# Patient Record
Sex: Female | Born: 1960 | Race: Black or African American | Hispanic: No | Smoking: Never smoker
Health system: Southern US, Community
[De-identification: ages and names within clinical notes are randomized; demographics above are authoritative.]

## PROBLEM LIST (undated history)

## (undated) DIAGNOSIS — I10 Essential (primary) hypertension: Secondary | ICD-10-CM

## (undated) DIAGNOSIS — E7800 Pure hypercholesterolemia, unspecified: Secondary | ICD-10-CM

## (undated) DIAGNOSIS — K219 Gastro-esophageal reflux disease without esophagitis: Secondary | ICD-10-CM

## (undated) HISTORY — DX: Pure hypercholesterolemia, unspecified: E78.00

## (undated) HISTORY — DX: Gastro-esophageal reflux disease without esophagitis: K21.9

## (undated) HISTORY — PX: OVARIAN CYST REMOVAL: SHX89

## (undated) HISTORY — PX: OTHER SURGICAL HISTORY: SHX169

## (undated) HISTORY — PX: OVARIAN CYST DRAINAGE: SHX325

---

## 2008-04-28 ENCOUNTER — Ambulatory Visit: Payer: Self-pay | Admitting: Family Medicine

## 2008-04-28 DIAGNOSIS — G4700 Insomnia, unspecified: Secondary | ICD-10-CM

## 2008-04-28 DIAGNOSIS — H9319 Tinnitus, unspecified ear: Secondary | ICD-10-CM | POA: Insufficient documentation

## 2008-04-28 DIAGNOSIS — N949 Unspecified condition associated with female genital organs and menstrual cycle: Secondary | ICD-10-CM

## 2008-04-28 DIAGNOSIS — D508 Other iron deficiency anemias: Secondary | ICD-10-CM

## 2008-04-28 DIAGNOSIS — J309 Allergic rhinitis, unspecified: Secondary | ICD-10-CM

## 2008-05-02 ENCOUNTER — Encounter: Payer: Self-pay | Admitting: Family Medicine

## 2008-05-14 ENCOUNTER — Encounter: Payer: Self-pay | Admitting: Family Medicine

## 2008-05-19 ENCOUNTER — Encounter: Payer: Self-pay | Admitting: Family Medicine

## 2008-06-13 HISTORY — PX: COLONOSCOPY: SHX174

## 2008-07-02 ENCOUNTER — Ambulatory Visit: Payer: Self-pay | Admitting: Family Medicine

## 2008-07-02 ENCOUNTER — Encounter: Payer: Self-pay | Admitting: Family Medicine

## 2008-07-02 ENCOUNTER — Other Ambulatory Visit: Admission: RE | Admit: 2008-07-02 | Discharge: 2008-07-02 | Payer: Self-pay | Admitting: Family Medicine

## 2008-07-02 DIAGNOSIS — N809 Endometriosis, unspecified: Secondary | ICD-10-CM | POA: Insufficient documentation

## 2008-07-02 DIAGNOSIS — E049 Nontoxic goiter, unspecified: Secondary | ICD-10-CM | POA: Insufficient documentation

## 2008-07-02 DIAGNOSIS — H547 Unspecified visual loss: Secondary | ICD-10-CM | POA: Insufficient documentation

## 2008-07-03 ENCOUNTER — Encounter: Payer: Self-pay | Admitting: Family Medicine

## 2008-07-04 LAB — CONVERTED CEMR LAB
CO2: 24 meq/L (ref 19–32)
Chloride: 106 meq/L (ref 96–112)
Glucose, Bld: 76 mg/dL (ref 70–99)
HDL: 62 mg/dL (ref >39)
Hemoglobin: 10.7 g/dL — ABNORMAL LOW (ref 12.0–15.0)
LDL Cholesterol: 137 mg/dL — ABNORMAL HIGH (ref 0–99)
Lymphs Abs: 2.9 10*3/uL (ref 0.7–4.0)
MCHC: 32.7 g/dL (ref 30.0–36.0)
MCV: 92.1 fL (ref 78.0–100.0)
Neutro Abs: 3.5 10*3/uL (ref 1.7–7.7)
RBC: 3.55 M/uL — ABNORMAL LOW (ref 3.87–5.11)
RDW: 13 % (ref 11.5–15.5)
Sodium: 142 meq/L (ref 135–145)
Total CHOL/HDL Ratio: 3.5
Triglycerides: 90 mg/dL (ref <150)
VLDL: 18 mg/dL (ref 0–40)
WBC: 6.8 10*3/uL (ref 4.0–10.5)

## 2008-07-06 DIAGNOSIS — I10 Essential (primary) hypertension: Secondary | ICD-10-CM

## 2008-07-10 ENCOUNTER — Ambulatory Visit: Payer: Self-pay | Admitting: Family Medicine

## 2008-07-10 ENCOUNTER — Telehealth: Payer: Self-pay | Admitting: Family Medicine

## 2008-07-10 DIAGNOSIS — R5381 Other malaise: Secondary | ICD-10-CM

## 2008-07-10 DIAGNOSIS — E538 Deficiency of other specified B group vitamins: Secondary | ICD-10-CM | POA: Insufficient documentation

## 2008-07-10 DIAGNOSIS — R5383 Other fatigue: Secondary | ICD-10-CM

## 2008-07-11 ENCOUNTER — Ambulatory Visit (HOSPITAL_COMMUNITY): Admission: RE | Admit: 2008-07-11 | Discharge: 2008-07-11 | Payer: Self-pay | Admitting: Pediatrics

## 2008-09-09 ENCOUNTER — Ambulatory Visit: Payer: Self-pay | Admitting: Family Medicine

## 2008-09-09 DIAGNOSIS — K3189 Other diseases of stomach and duodenum: Secondary | ICD-10-CM

## 2008-09-09 DIAGNOSIS — R634 Abnormal weight loss: Secondary | ICD-10-CM | POA: Insufficient documentation

## 2008-09-09 DIAGNOSIS — R1013 Epigastric pain: Secondary | ICD-10-CM

## 2008-09-11 LAB — CONVERTED CEMR LAB: Vitamin B-12: 730 pg/mL (ref 211–911)

## 2008-10-09 ENCOUNTER — Telehealth: Payer: Self-pay | Admitting: Family Medicine

## 2008-10-14 ENCOUNTER — Telehealth: Payer: Self-pay | Admitting: Family Medicine

## 2008-12-01 ENCOUNTER — Ambulatory Visit: Payer: Self-pay | Admitting: Family Medicine

## 2008-12-04 LAB — CONVERTED CEMR LAB: Retic Ct Pct: 1.3 % (ref 0.4–3.1)

## 2008-12-30 ENCOUNTER — Encounter: Payer: Self-pay | Admitting: Family Medicine

## 2008-12-31 ENCOUNTER — Encounter: Payer: Self-pay | Admitting: Family Medicine

## 2009-01-01 ENCOUNTER — Encounter: Payer: Self-pay | Admitting: Family Medicine

## 2009-01-07 ENCOUNTER — Telehealth: Payer: Self-pay | Admitting: Family Medicine

## 2009-04-02 ENCOUNTER — Ambulatory Visit: Payer: Self-pay | Admitting: Family Medicine

## 2009-04-02 DIAGNOSIS — F329 Major depressive disorder, single episode, unspecified: Secondary | ICD-10-CM

## 2009-08-17 LAB — CONVERTED CEMR LAB
Basophils Relative: 1 % (ref 0–1)
CO2: 26 meq/L (ref 19–32)
Cholesterol: 209 mg/dL — ABNORMAL HIGH (ref 0–200)
Creatinine, Ser: 0.94 mg/dL (ref 0.40–1.20)
Eosinophils Absolute: 0.2 10*3/uL (ref 0.0–0.7)
HDL: 52 mg/dL (ref >39)
LDL Cholesterol: 142 mg/dL — ABNORMAL HIGH (ref 0–99)
Lymphocytes Relative: 36 % (ref 12–46)
MCV: 92.9 fL (ref 78.0–100.0)
Monocytes Absolute: 0.3 10*3/uL (ref 0.1–1.0)
Neutrophils Relative %: 55 % (ref 43–77)
Platelets: 313 10*3/uL (ref 150–400)
Potassium: 3.6 meq/L (ref 3.5–5.3)
RBC: 3.66 M/uL — ABNORMAL LOW (ref 3.87–5.11)
Triglycerides: 75 mg/dL (ref <150)
WBC: 6.9 10*3/uL (ref 4.0–10.5)

## 2009-08-18 ENCOUNTER — Ambulatory Visit: Payer: Self-pay | Admitting: Family Medicine

## 2009-08-24 ENCOUNTER — Encounter: Payer: Self-pay | Admitting: Family Medicine

## 2009-08-31 ENCOUNTER — Telehealth: Payer: Self-pay | Admitting: Family Medicine

## 2009-09-07 ENCOUNTER — Telehealth: Payer: Self-pay | Admitting: Family Medicine

## 2009-09-24 ENCOUNTER — Ambulatory Visit: Payer: Self-pay | Admitting: Orthopedic Surgery

## 2009-09-24 DIAGNOSIS — M23329 Other meniscus derangements, posterior horn of medial meniscus, unspecified knee: Secondary | ICD-10-CM | POA: Insufficient documentation

## 2009-09-28 ENCOUNTER — Telehealth: Payer: Self-pay | Admitting: Orthopedic Surgery

## 2009-10-07 ENCOUNTER — Ambulatory Visit (HOSPITAL_COMMUNITY): Admission: RE | Admit: 2009-10-07 | Discharge: 2009-10-07 | Payer: Self-pay | Admitting: Orthopedic Surgery

## 2009-10-14 ENCOUNTER — Ambulatory Visit: Payer: Self-pay | Admitting: Orthopedic Surgery

## 2009-10-14 DIAGNOSIS — M1710 Unilateral primary osteoarthritis, unspecified knee: Secondary | ICD-10-CM | POA: Insufficient documentation

## 2009-10-14 DIAGNOSIS — IMO0002 Reserved for concepts with insufficient information to code with codable children: Secondary | ICD-10-CM

## 2010-01-27 ENCOUNTER — Ambulatory Visit: Payer: Self-pay | Admitting: Family Medicine

## 2010-01-27 DIAGNOSIS — M25569 Pain in unspecified knee: Secondary | ICD-10-CM

## 2010-01-27 DIAGNOSIS — M899 Disorder of bone, unspecified: Secondary | ICD-10-CM | POA: Insufficient documentation

## 2010-01-27 DIAGNOSIS — M949 Disorder of cartilage, unspecified: Secondary | ICD-10-CM

## 2010-02-03 ENCOUNTER — Telehealth (INDEPENDENT_AMBULATORY_CARE_PROVIDER_SITE_OTHER): Payer: Self-pay | Admitting: *Deleted

## 2010-02-03 ENCOUNTER — Ambulatory Visit: Payer: Self-pay | Admitting: Family Medicine

## 2010-02-03 DIAGNOSIS — R3915 Urgency of urination: Secondary | ICD-10-CM | POA: Insufficient documentation

## 2010-02-03 DIAGNOSIS — R079 Chest pain, unspecified: Secondary | ICD-10-CM

## 2010-02-03 LAB — CONVERTED CEMR LAB
Bilirubin Urine: NEGATIVE
Glucose, Urine, Semiquant: NEGATIVE
Ketones, urine, test strip: NEGATIVE
Specific Gravity, Urine: 1.02

## 2010-02-04 ENCOUNTER — Encounter: Payer: Self-pay | Admitting: Physician Assistant

## 2010-02-05 DIAGNOSIS — E785 Hyperlipidemia, unspecified: Secondary | ICD-10-CM | POA: Insufficient documentation

## 2010-02-10 ENCOUNTER — Telehealth: Payer: Self-pay | Admitting: Physician Assistant

## 2010-06-09 ENCOUNTER — Encounter: Payer: Self-pay | Admitting: Family Medicine

## 2010-07-04 ENCOUNTER — Encounter: Payer: Self-pay | Admitting: Family Medicine

## 2010-07-04 ENCOUNTER — Encounter: Payer: Self-pay | Admitting: Orthopedic Surgery

## 2010-07-05 ENCOUNTER — Ambulatory Visit: Admit: 2010-07-05 | Payer: Self-pay | Admitting: Family Medicine

## 2010-07-13 NOTE — Assessment & Plan Note (Signed)
Summary: MRI RESULTS BRINGING DISC/AETNA/BSF   Visit Type:  Follow-up Referring Provider:  Dr. Lodema Hong Primary Provider:  Dr. Lodema Hong  CC:  right knee pain.  History of Present Illness: 50 years old, and RIGHT knee pain after trauma, returns for MRI review Meds: Maxzide, Restoril, Fluoxetine, Vitamin B12, Ibuprofen.  MRI results.  IMPRESSION:   1.  No evidence of meniscal tear. 2.  Mild lateral tibial chondromalacia. 3.  No acute osteochondral or ligamentous findings identified.   Read By:  Gerrianne Scale,  M.D.      The patient did well while she was on her cruise vacation , [place]. The temperature was a little bit cool. Pain started again. mainly we are dealing with arthritis.  Recommend one of the following with the Tylenol arthritis, and arthritis medication, injection. Course exercise is encouraged. Walking.    Allergies: 1)  ! Pcn   Impression & Recommendations:  Problem # 1:  DERANGEMENT OF POSTERIOR HORN OF MEDIAL MENISCUS (ICD-717.2) Assessment Comment Only cartilage was normal in terms of the meniscus  Other Orders: Est. Patient Level II ([address])  Patient Instructions: 1)  Tylenol 500 - 1000 mg 2 -3 times per day  2)  Please schedule a follow-up appointment as needed.

## 2010-07-13 NOTE — Progress Notes (Signed)
Summary: refill  Phone Note Call from Patient   Summary of Call: needs a refill on [facility]onase. [facility] [place] [phone] Initial call taken by: Rudene Anda,  August 31, 2009 8:30 AM  Follow-up for Phone Call        Rx Called In Follow-up by: Adella Hare LPN,  August 31, 2009 8:48 AM    New/Updated Medications: FLONASE 50 MCG/ACT SUSP (FLUTICASONE PROPIONATE) two puffs per nostril daily as needed Prescriptions: FLONASE 50 MCG/ACT SUSP (FLUTICASONE PROPIONATE) two puffs per nostril daily as needed  #1 x 5   Entered by:   Adella Hare LPN   Authorized by:   Syliva Overman MD   Signed by:   Adella Hare LPN on 84/69/6295   Method used:   [facility]  [address] [address] [address] [id number]       [address]       [place], [place]  [address]       Ph: [phone] or [phone]       Fax: [phone]   RxID:   [id number]

## 2010-07-13 NOTE — Letter (Signed)
Summary: History form  History form   Imported By: Jacklynn Ganong 09/28/2009 13:56:17  _____________________________________________________________________  External Attachment:    Type:   Image     Comment:   External Document

## 2010-07-13 NOTE — Progress Notes (Signed)
Summary: MRI appointment.  Phone Note Outgoing Call   Call placed by: Waldon Reining,  September 28, 2009 2:46 PM Call placed to: Patient Action Taken: Appt scheduled Summary of Call: I left a message with the patient's MRI appointment at [facility] on 10-07-09 at 8:30 am. Patient has Monia Pouch, authorization [id number] and it expires on 12-27-09. Patient will follow up here for her results.

## 2010-07-13 NOTE — Progress Notes (Signed)
Summary: back still hurting  Phone Note Call from Patient   Summary of Call: pts back still hurting and been taking ibprofen . she may need something else.  [phone] Initial call taken by: Rudene Anda,  February 10, 2010 11:53 AM  Follow-up for Phone Call        Advise pt that I have prescribed a muscle relaxer for her back pain.  Appt next week for recheck. Follow-up by: Esperanza Sheets PA,  February 11, 2010 11:53 AM  Additional Follow-up for Phone Call Additional follow up Details #1::        called patient,  left message    New/Updated Medications: ROBAXIN 500 MG TABS (METHOCARBAMOL) take 2 tabs every 6 hrs as needed for back pain Prescriptions: ROBAXIN 500 MG TABS (METHOCARBAMOL) take 2 tabs every 6 hrs as needed for back pain  #40 x 0   Entered and Authorized by:   Esperanza Sheets PA   Signed by:   Esperanza Sheets PA on 02/11/2010   Method used:   [facility]  [address] [address] [id number]       [address]       [place], [place]  [address]       Ph: [phone] or [phone]       Fax: [phone]   RxID:   [id number]

## 2010-07-13 NOTE — Assessment & Plan Note (Signed)
Summary: urinary symptoms- room 1   Vital Signs:  Patient profile:   50 year old female Height:      70 inches Weight:      171.25 pounds BMI:     24.66 O2 Sat:      99 % on Room air Pulse rate:   77 / minute Resp:     16 per minute BP sitting:   120 / 77  (left arm)  Vitals Entered By: Adella Hare LPN (February 03, 2010 1:25 PM) CC: right flank pain, urinary urgency Is Patient Diabetic? No   Referring Tylia Ewell:  Dr. Lodema Hong Primary Halo Shevlin:  Dr. Lodema Hong  CC:  right flank pain and urinary urgency.  History of Present Illness: Pt presents today with c/o pain in her Rt mid back area and urinary frequency and urgency.  She is concerned she may have a UTI.  She states she had the same pain in her back over a week ago but went away after taking Ibuprofen.  In the last few days it has returned. Worse with lying down and certain mvmts.  Is also having urinary frequency and urgency, but no dysuria. Nocturia 2 x per night.  Is drinking more water but feels that she goes more often than the increase in water consumption accts for.  She denies radiation of pain.  No trauma.  No fever or chills.   Allergies (verified): 1)  ! Pcn  Past History:  Past medical history reviewed for relevance to current acute and chronic problems.  Past Medical History: Reviewed history from 09/24/2009 and no changes required.  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 GI:  Denies abdominal pain, change in bowel habits, loss of appetite, nausea, and vomiting.  Physical Exam  General:  Well-developed,well-nourished,in no acute distress; alert,appropriate and cooperative throughout examination Head:  Normocephalic and atraumatic without obvious abnormalities.  No apparent alopecia or balding. Chest Wall:  no tenderness.   Lungs:  Normal respiratory effort, chest expands symmetrically. Lungs are clear to auscultation, no crackles or wheezes. Heart:  Normal rate and regular rhythm. S1 and S2 normal without gallop, murmur, click, rub or other extra sounds. Abdomen:  Bowel sounds positive,abdomen soft and non-tender without masses, organomegaly or hernias noted. Msk:  Nontender to palp flank and paraspinal musculature.  Is TTP, with reproducable pain with palp of the approx 8th rib posterolaterally.  Neg [employer] Psych:  Cognition and judgment appear intact. Alert and cooperative with normal attention span and concentration. No apparent delusions, illusions, hallucinations   Impression & Recommendations:  Problem # 1:  URINARY URGENCY (RJJ-884.16) Assessment New Urgency and frequency without dysuria.  Orders: Urinalysis ([id number]) T-Culture, Urine ([id number])  Problem # 2:  RIB PAIN, RIGHT SIDED (ICD-786.50) Assessment: New Ibuprofen or Aleve.  Ice.  Complete Medication List: 1)  Maxzide-25 37.5-25 Mg Tabs (Triamterene-hctz) .... Take 1 tablet by mouth once a day 2)  Fluoxetine Hcl 10 Mg Tabs (Fluoxetine hcl) .... Take 1 tablet by mouth once a day 3)  Vitamin B-12 1000 Mcg Tabs (Cyanocobalamin) .... Take 1 tablet by mouth once a day 4)  Flonase 50 Mcg/act Susp (Fluticasone propionate) .... Two puffs per nostril daily as needed 5)  Tylenol Ex St Arthritis Pain 500 Mg Tabs (Acetaminophen) .... As needed 6)  Depo-provera 150 Mg/ml Susp (Medroxyprogesterone acetate) .... Q 90 days 7)  Temazepam 30 Mg Caps (Temazepam) .... Take 1 capsule by mouth at bedtime 8)  Ibuprofen 800 Mg Tabs (Ibuprofen) .... Take 1 tablet by mouth three times a day for 1 week then one twice dailty as needed 9)  Ciprofloxacin Hcl 250 Mg Tabs (Ciprofloxacin hcl) .... Take 1 two times a day x 5 days  Patient Instructions: 1)  Please schedule a follow-up appointment as  needed for this problem. 2)  Watch for worsening symptoms. 3)  I have prescribed an antibiotic for you. 4)  I am sending your urnie for a culture. Prescriptions: CIPROFLOXACIN HCL 250 MG TABS (CIPROFLOXACIN HCL) take 1 two times a day x 5 days  #10 x 0   Entered and Authorized by:   Esperanza Sheets PA   Signed by:   Esperanza Sheets PA on 02/03/2010   Method used:   [facility]        [facility]  [address] [address] [address] [id number]       [address]       [place], [place]  [address]       Ph: [phone] or [phone]       Fax: [phone]   RxID:   [id number]   Laboratory Results   Urine Tests  Date/Time Received: February 03, 2010 1:55 PM  Date/Time Reported: February 03, 2010 1:55 PM   Routine Urinalysis   Color: yellow Appearance: Clear Glucose: negative   (Normal Range: Negative) Bilirubin: negative   (Normal Range: Negative) Ketone: negative   (Normal Range: Negative) Spec. Gravity: 1.020   (Normal Range: 1.003-1.035) Blood: small   (Normal Range: Negative) pH: 7.0   (Normal Range: 5.0-8.0) Protein: negative   (Normal Range: Negative) Urobilinogen: 0.2   (Normal Range: 0-1) Nitrite: negative   (Normal Range: Negative) Leukocyte Esterace: negative   (Normal Range: Negative)

## 2010-07-13 NOTE — Assessment & Plan Note (Signed)
Summary: LABS   Vital Signs:  Patient profile:   50 year old female Height:      66 inches Weight:      163.25 pounds BMI:     26.44 O2 Sat:      97 % Pulse rate:   71 / minute Pulse rhythm:   regular Resp:     16 per minute BP sitting:   120 / 80  (left arm) Cuff size:   regular  Vitals Entered By: Everitt Amber LPN (August 19, 4538 10:25 AM)  Nutrition Counseling: Patient's BMI is greater than 25 and therefore counseled on weight management options. CC: had labs lastweek and said we needed her to come in    CC:  had labs lastweek and said we needed her to come in .  History of Present Illness: 1 week h/o acute right knee pain during exercise, since then swollen ,tetnder posteriorly with limited movement. Reports  that she has otherwisee been  doing well. Denies recent fever or chills. Denies sinus pressure, nasal congestion , ear pain or sore throat. Denies chest congestion, or cough productive of sputum. Denies chest pain, palpitations, PND, orthopnea or leg swelling. Denies abdominal pain, nausea, vomitting, diarrhea or constipation. Denies change in bowel movements or bloody stool. Denies dysuria , frequency, incontinence or hesitancy. Denies  joint pain, swelling, or reduced mobility. Denies headaches, vertigo, seizures. Denies depression, anxiety or insomnia. Denies  rash, lesions, or itch.     Current Medications (verified): 1)  Maxzide-25 37.5-25 Mg Tabs (Triamterene-Hctz) .... Take 1 Tablet By Mouth Once A Day 2)  Restoril 15 Mg Caps (Temazepam) .... Take 1 Capsule By Mouth At Bedtime As Needed 3)  Fluoxetine Hcl 10 Mg Tabs (Fluoxetine Hcl) .... Take 1 Tablet By Mouth Once A Day 4)  Vitamin B-12 1000 Mcg Tabs (Cyanocobalamin) .... Take 1 Tablet By Mouth Once A Day  Allergies (verified): 1)  ! Pcn  Review of Systems      See HPI Eyes:  Denies blurring and discharge. GU:  Denies abnormal vaginal bleeding and discharge; pt treateated by gynae for heavy menses  with success.. Endo:  Denies cold intolerance, excessive hunger, excessive thirst, excessive urination, heat intolerance, polyuria, and weight change. Heme:  Denies abnormal bruising and enlarge lymph nodes. Allergy:  Complains of seasonal allergies; spring and summer ues flonase prn.  Physical Exam  General:  Well-developed,well-nourished,in no acute distress; alert,appropriate and cooperative throughout examination HEENT: No facial asymmetry,  EOMI, No sinus tenderness, TM's Clear, oropharynx  pink and moist.   Chest: Clear to auscultation bilaterally.  CVS: S1, S2, No murmurs, No S3.   Abd: Soft, Nontender.  MS: Adequate ROM spine, hips, shoulders and  reduced  in right knee  Ext: No edema.   CNS: CN 2-12 intact, power tone and sensation normal throughout.   Skin: Intact, no visible lesions or rashes.  Psych: Good eye contact, normal affect.  Memory intact, not anxious or depressed appearing.    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 One tab by mouth once daily Her updated medication list for this problem includes:    Fluoxetine Hcl 10 Mg Tabs (Fluoxetine hcl) .Marland Kitchen... Take 1 tablet by mouth once a day  Problem # 2:  INSOMNIA UNSPECIFIED (ICD-780.52) Assessment: Improved  Her updated medication list for this problem includes:  Restoril 15 Mg Caps (Temazepam) .Marland Kitchen... Take 1 capsule by mouth at bedtime as needed  Discussed sleep hygiene.   Problem # 3:  HYPERTENSION (ICD-401.9) Assessment: Unchanged  The following medications were removed from the medication list:    Benazepril-hydrochlorothiazide 20-25 Mg Tabs (Benazepril-hydrochlorothiazide) ..... One tab by mouth once daily    Benazepril Hcl 10 Mg Tabs (Benazepril hcl) ..... One tab by mouth once daily Her updated medication list for this problem  includes:    Maxzide-25 37.5-25 Mg Tabs (Triamterene-hctz) .Marland Kitchen... Take 1 tablet by mouth once a day  Orders: T-Basic Metabolic Panel ([id number])  BP today: 120/80 Prior BP: 110/60 (04/02/2009)  Labs Reviewed: K+: 3.6 (08/12/2009) Creat: : 0.94 (08/12/2009)   Chol: 209 (08/12/2009)   HDL: 52 (08/12/2009)   LDL: 142 (08/12/2009)   TG: 75 (08/12/2009)  Problem # 4:  ENDOMETRIOSIS (ICD-617.9) Assessment: Improved  Complete Medication List: 1)  Maxzide-25 37.5-25 Mg Tabs (Triamterene-hctz) .... Take 1 tablet by mouth once a day 2)  Restoril 15 Mg Caps (Temazepam) .... Take 1 capsule by mouth at bedtime as needed 3)  Fluoxetine Hcl 10 Mg Tabs (Fluoxetine hcl) .... Take 1 tablet by mouth once a day 4)  Vitamin B-12 1000 Mcg Tabs (Cyanocobalamin) .... Take 1 tablet by mouth once a day 5)  Ibuprofen 800 Mg Tabs (Ibuprofen) .... Take 1 tablet by mouth three times a day 6)  Prednisone (pak) 5 Mg Tabs (Prednisone) .... Use as directed  Other Orders: T-Lipid Profile ([id number]) T-CBC w/Diff ([id number]) T-Anemia Panel 3  (2904) Radiology Referral (Radiology)  Patient Instructions: 1)  Please schedule a follow-up appointment in 4.5 months. 2)  i recommmend anti-inflammatories for the right knee pin, if not better in 1 to 2 weeks pls calll, i will refer you to ortho. 3)  It is important that you exercise regularly at least 30 minutes 5 times a week. If you develop chest pain, have severe difficulty breathing, or feel very tired , stop exercising immediately and seek medical attention. 4)  You need to lose weight. Consider a lower calorie diet and regular exercise. goal is 5 to 7 pounds in 4.5 months 5)  Schedule your mammogram. We will schedule wnd call you with appt. 6)  pls reduce fat intake in your diet as much as possible , since your bad cholesterol is elevated, if it remains high , i will prescribe meds at next visit. 7)  BMP prior to visit, ICD-9: 8)  Lipid Panel prior to visit,  ICD-9:  fasting in 4. 5 months 9)  CBC w/ Diff prior to visit, ICD-9: 10)  anemia pan Prescriptions: FLUOXETINE HCL 10 MG TABS (FLUOXETINE HCL) Take 1 tablet by mouth once a day  #90 x 3   Entered by:   Adella Hare LPN   Authorized by:   Syliva Overman MD   Signed by:   Adella Hare LPN on 29/52/8413   Method used:   Historical   RxID:   [id number] MAXZIDE-25 37.5-25 MG TABS (TRIAMTERENE-HCTZ) Take 1 tablet by mouth once a day  #90 x 3   Entered by:   Adella Hare LPN   Authorized by:   Syliva Overman MD   Signed by:   Adella Hare LPN on 34/74/2595   Method used:   Historical   RxID:   [id number] BENAZEPRIL HCL 10 MG TABS (BENAZEPRIL HCL) one tab by mouth once daily  #30 x 2   Entered by:   Adella Hare LPN   Authorized by:  Syliva Overman MD   Signed by:   Adella Hare LPN on 27/25/3664   Method used:   [facility]        [facility] [address] [facility]       [address]       [place], [place]  [address]       Ph: [phone] or [phone]       Fax: [phone]   RxID:   [id number] BENAZEPRIL-HYDROCHLOROTHIAZIDE 20-25 MG TABS (BENAZEPRIL-HYDROCHLOROTHIAZIDE) one tab by mouth once daily  #30 x 2   Entered by:   Adella Hare LPN   Authorized by:   Syliva Overman MD   Signed by:   Adella Hare LPN on 42/70/6237   Method used:   [facility]        [facility] [address] [address] [facility]       [address]       [place], [place]  [address]       Ph: [phone] or [phone]       Fax: [phone]   RxID:   [id number] SERTRALINE HCL 100 MG TABS (SERTRALINE HCL) one tab by mouth once daily  #30 x 2   Entered by:   Adella Hare LPN   Authorized by:   Syliva Overman MD   Signed by:   Adella Hare LPN on 81/06/7508   Method used:   [facility]        [facility] [address] [address] [facility]       [address]       [place], [place]  [address]       Ph:  [phone] or [phone]       Fax: [phone]   RxID:   [id number] PREDNISONE (PAK) 5 MG TABS (PREDNISONE) Use as directed  #21 x 0   Entered and Authorized by:   Syliva Overman MD   Signed by:   Syliva Overman MD on 08/18/2009   Method used:   [facility]ically [facility]        [facility]  [address] [address] [address] [facility]       [address]       [place], [place]  [address]       Ph: [phone] or [phone]       Fax: [phone]   RxID:   [id number] IBUPROFEN 800 MG TABS (IBUPROFEN) Take 1 tablet by mouth three times a day  #30 x 0   Entered and Authorized by:   Syliva Overman MD   Signed by:   Syliva Overman MD on 08/18/2009   Method used:   [facility]        [facility] [address] [facility]       [address]       [place], [place]  [address]       Ph: [phone] or [phone]       Fax: [phone]   RxID:   [id number]   Benazepril, benazepril/hctz, sertraline all entered in wrong chart

## 2010-07-13 NOTE — Assessment & Plan Note (Signed)
Summary: f up   Vital Signs:  Patient profile:   50 year old female Menstrual status:  depo Height:      66 inches Weight:      172 pounds BMI:     27.86 O2 Sat:      97 % Pulse rate:   59 / minute Pulse rhythm:   regular Resp:     16 per minute BP sitting:   120 / 82  (left arm) Cuff size:   regular  Vitals Entered By: Everitt Amber LPN (January 27, 2010 8:06 AM)  Nutrition Counseling: Patient's BMI is greater than 25 and therefore counseled on weight management options. CC: Follow up chronic problems     Menstrual Status depo Last PAP Result NEGATIVE FOR INTRAEPITHELIAL LESIONS OR MALIGNANCY.   Primary Care Provider:  Dr. Lodema Hong  CC:  Follow up chronic problems.  History of Present Illness: Reports  thatshe has been doing fairly well. Denies recent fever or chills. Denies sinus pressure, nasal congestion , ear pain or sore throat. Denies chest congestion, or cough productive of sputum. Denies chest pain, palpitations, PND, orthopnea or leg swelling. Denies abdominal pain, nausea, vomitting, diarrhea or constipation. Denies change in bowel movements or bloody stool. Denies dysuria , frequency, incontinence or hesitancy. Denies  joint pain, swelling, or reduced mobility. Denies headaches, vertigo, seizures. Denies depression, anxiety or insomnia. Denies  rash, lesions, or itch.      Current Medications (verified): 1)  Maxzide-25 37.5-25 Mg Tabs (Triamterene-Hctz) .... Take 1 Tablet By Mouth Once A Day 2)  Restoril 15 Mg Caps (Temazepam) .... Take 1 Capsule By Mouth At Bedtime As Needed 3)  Fluoxetine Hcl 10 Mg Tabs (Fluoxetine Hcl) .... Take 1 Tablet By Mouth Once A Day 4)  Vitamin B-12 1000 Mcg Tabs (Cyanocobalamin) .... Take 1 Tablet By Mouth Once A Day 5)  Flonase 50 Mcg/act Susp (Fluticasone Propionate) .... Two Puffs Per Nostril Daily As Needed 6)  Tylenol Ex St Arthritis Pain 500 Mg Tabs (Acetaminophen) .... As Needed 7)  Depo-Provera 150 Mg/ml Susp  (Medroxyprogesterone Acetate) .... Q 90 Days  Allergies (verified): 1)  ! Pcn  Review of Systems      See HPI General:  Complains of fatigue. Eyes:  Denies blurring, discharge, eye pain, and red eye. Endo:  Denies cold intolerance, excessive hunger, excessive thirst, excessive urination, heat intolerance, polyuria, and weight change. Heme:  Denies abnormal bruising and bleeding. Allergy:  Complains of seasonal allergies; denies hives or rash and itching eyes.  Physical Exam  General:  Well-developed,well-nourished,in no acute distress; alert,appropriate and cooperative throughout examination HEENT: No facial asymmetry,  EOMI, No sinus tenderness, TM's Clear, oropharynx  pink and moist.   Chest: Clear to auscultation bilaterally.  CVS: S1, S2, No murmurs, No S3.   Abd: Soft, Nontender.  MS: Adequate ROM spine, hips, shoulders and  reduced  in right knee  Ext: No edema.   CNS: CN 2-12 intact, power tone and sensation normal throughout.   Skin: Intact, no visible lesions or rashes.  Psych: Good eye contact, normal affect.  Memory intact, not anxious or depressed appearing.    Impression & Recommendations:  Problem # 1:  DEPRESSION, MILD (ICD-311) Assessment Improved  Her updated medication list for this problem includes:    Fluoxetine Hcl 10 Mg Tabs (Fluoxetine hcl) .Marland Kitchen... Take 1 tablet by mouth once a day  Problem # 2:  GOITER, UNSPECIFIED (ICD-240.9) Assessment: Comment Only  Orders: Radiology Referral (Radiology)  Problem # 3:  HYPERTENSION (ICD-401.9) Assessment: Unchanged  Her updated medication list for this problem includes:    Maxzide-25 37.5-25 Mg Tabs (Triamterene-hctz) .Marland Kitchen... Take 1 tablet by mouth once a day  BP today: 120/82 Prior BP: 120/80 (08/18/2009)  Labs Reviewed: K+: 4.0 (01/22/2010) Creat: : 0.96 (01/22/2010)   Chol: 200 (01/22/2010)   HDL: 49 (01/22/2010)   LDL: 136 (01/22/2010)   TG: 76 (01/22/2010)  Problem # 4:  ENDOMETRIOSIS  (ICD-617.9) Assessment: Improved followed by gynae  Problem # 7:  HYPERLIPIDEMIA (ICD-272.4) Assessment: Improved  Labs Reviewed: SGOT: 14 (12/01/2008)   SGPT: 8 (12/01/2008)   HDL:49 (01/22/2010), 52 (08/12/2009)  LDL:136 (01/22/2010), 142 (08/12/2009)  Chol:200 (01/22/2010), 209 (08/12/2009)  Trig:76 (01/22/2010), 75 (08/12/2009) low fat diet discussed and encouraged  Complete Medication List: 1)  Maxzide-25 37.5-25 Mg Tabs (Triamterene-hctz) .... Take 1 tablet by mouth once a day 2)  Fluoxetine Hcl 10 Mg Tabs (Fluoxetine hcl) .... Take 1 tablet by mouth once a day 3)  Vitamin B-12 1000 Mcg Tabs (Cyanocobalamin) .... Take 1 tablet by mouth once a day 4)  Flonase 50 Mcg/act Susp (Fluticasone propionate) .... Two puffs per nostril daily as needed 5)  Tylenol Ex St Arthritis Pain 500 Mg Tabs (Acetaminophen) .... As needed 6)  Depo-provera 150 Mg/ml Susp (Medroxyprogesterone acetate) .... Q 90 days 7)  Temazepam 30 Mg Caps (Temazepam) .... Take 1 capsule by mouth at bedtime 8)  Ibuprofen 800 Mg Tabs (Ibuprofen) .... Take 1 tablet by mouth three times a day for 1 week then one twice dailty as needed  Other Orders: T-Basic Metabolic Panel [phone]) T-Lipid Profile [phone]) T-CBC w/Diff [id number]) T-Vitamin D (25-Hydroxy) [phone])  Patient Instructions: 1)  Please schedule a follow-up appointment in 4 to 4.5 months. 2)  It is important that you exercise regularly at least 20 minutes 5 times a week. If you develop chest pain, have severe difficulty breathing, or feel very tired , stop exercising immediately and seek medical attention. 3)  plstreduce fried and fatty foods, increase fruit and ve g water, white meats, beans and eggs. 4)  (boiled) 5)  meds as discussed. 6)  thyroid US to be done as f/u Prescriptions: PREDNISONE (PAK) 5 MG TABS (PREDNISONE) Use as directed  #21 x 0   Entered and Authorized by:   Syliva Overman MD   Signed by:   Syliva Overman MD on  01/27/2010   Method used:   Electronically to        [facility] [address] [address] [id number] (retail)       [address]       [place], [place]  [address]       Ph: [phone] or [phone]       Fax: [phone]   RxID:   [id number] IBUPROFEN 800 MG TABS (IBUPROFEN) Take 1 tablet by mouth three times a day for 1 week then one twice dailty as needed  #60 x 0   Entered and Authorized by:   Syliva Overman MD   Signed by:   Syliva Overman MD on 01/27/2010   Method used:   Electronically to        [facility]  [address] [address] [id number] (retail)       [address]       [place], [place]  [address]       Ph: [phone] or [phone]  Fax: [phone]   RxID:   [id number] TEMAZEPAM 30 MG CAPS (TEMAZEPAM) Take 1 capsule by mouth at bedtime  #30 x 4   Entered and Authorized by:   Syliva Overman MD   Signed by:   Syliva Overman MD on 01/27/2010   Method used:   Printed then faxed to ...       [facility]  [address] [address] [id number] ([facility]ail[facility]       [address]       [place], [place]  [address]       Ph: [phone] or [phone]       Fax: [phone]   RxID:   [id number]

## 2010-07-13 NOTE — Progress Notes (Signed)
Summary: pain  Phone Note Call from Patient   Summary of Call: pain in middle back around her kidneys and can not hold her urine getting up during the night  call her at work after 8:30 @ [phone] call in something at [facility] in [place] Initial call taken by: Lind Guest,  February 03, 2010 7:56 AM  Follow-up for Phone Call        please have patient schedule appt today with Dawn Follow-up by: Adella Hare LPN,  February 03, 2010 9:44 AM  Additional Follow-up for Phone Call Additional follow up Details #1::        Appt Scheduled Today Additional Follow-up by: Lind Guest,  February 03, 2010 9:51 AM

## 2010-07-13 NOTE — Progress Notes (Signed)
Summary: referral ortho  Phone Note Call from Patient   Summary of Call: needs to be referred to ortho about right knee. is this ok?  [phone] Initial call taken by: Rudene Anda,  September 07, 2009 3:57 PM  Follow-up for Phone Call        yes pls refer her top ortho ofherchoice, eval right knee pain Follow-up by: Syliva Overman MD,  September 07, 2009 5:13 PM  Additional Follow-up for Phone Call Additional follow up Details #1::        pt was referred to dr. Diamantina [facility] office. they will call her with date and time. called pt and left message to call office back Additional Follow-up by: Rudene Anda,  September 08, 2009 1:43 PM

## 2010-07-13 NOTE — Assessment & Plan Note (Signed)
Summary: rt knee pain needs xr/[employer]/simpson/bsf   Vital Signs:  Patient profile:   50 year old female Height:      70 inches Weight:      163 pounds Pulse rate:   76 / minute Resp:     16 per minute  Vitals Entered By: Fuller Canada MD (September 24, 2009 10:48 AM)  Visit Type:  new patient Referring Provider:  Dr. Lodema Hong Primary Provider:  Dr. Lodema Hong  CC:  right knee pain.  History of Present Illness: 50 year old female with RIGHT knee pain in the posterior aspect of the knee x1 month. She is exercising kicked her leg and felt a pull behind the knee and then started having swelling. She was on prednisone ibuprofen, which seemed to help, she took that for 5 weeks. Her pain level is 6/10.  Quality of pain is dull and burning. Pain exacerbated by bending and walking. Associated symptoms of locking and swelling. He gets better when she keeps it still.   Xrays today in our office.  Meds: Maxzide, Restoril, Fluoxetine, Vitamin B12.    Allergies: 1)  ! 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,5   1995 tinnitus and hearing loss...2009 htn cholesterol anxiety  Family History: mom living aged 63  diabetic Father deceased atage 68  dementia, hTN , CVA mini several sister x1 healthy aged 51 Brothers 1 living aged 27 arthritis  in the spin, 2nd brother died at 40 in a MVA maternal graNDMOTHER AND PATERNA AUNT HAD BREAST CANCER AND BOTH HAD BONE CANCER FH of Cancer:  Family History Coronary Heart Disease female < 1 Family History of Arthritis  Social History: Occupation:  Haematologist separated x1 year, pt report good mental health, better off divorced now one daughter aged 8 Never Smoked Alcohol use-no Drug use-no 2 cups of coffee per day  Review of Systems Constitutional:  Denies weight loss,  weight gain, fever, chills, and fatigue. Cardiovascular:  Denies chest pain, palpitations, fainting, and murmurs. Respiratory:  Denies short of breath, wheezing, couch, tightness, pain on inspiration, and snoring . Gastrointestinal:  Denies heartburn, nausea, vomiting, diarrhea, constipation, and blood in your stools. Genitourinary:  Denies frequency, urgency, difficulty urinating, painful urination, flank pain, and bleeding in urine. Neurologic:  Denies numbness, tingling, unsteady gait, dizziness, tremors, and seizure. Musculoskeletal:  Denies joint pain, swelling, instability, stiffness, redness, heat, and muscle pain. Endocrine:  Denies excessive thirst, exessive urination, and heat or cold intolerance. Psychiatric:  Denies nervousness, depression, anxiety, and hallucinations. Skin:  Denies changes in the skin, poor healing, rash, itching, and redness. HEENT:  Denies blurred or double vision, eye pain, redness, and watering. Immunology:  Complains of seasonal allergies; denies sinus problems and allergic to bee stings. Hemoatologic:  Denies easy bleeding and brusing.  Physical Exam  Additional Exam:  GEN: well developed, well nourished, normal grooming and hygiene, no deformity and normal body habitus.  VS are as recoreded and stable   CDV: pulses are normal, no edema, no erythema. no tenderness  Lymph: normal lymph nodes   Skin: no rashes, skin lesions or open sores   NEURO: normal coordination, reflexes, sensation.   Psyche: awake, alert and oriented. Mood normal   Gait: limping    right knee  Inspection medial joint line tenderness small effusion  ROM 0-110  Motor normal , grade 5  Stability normal   left knee  no swelling, no tenderness, FROM, 5/5 strength, stable knee   MCMURRAYS + for medial meniscus tear     Impression & Recommendations:  Problem # 1:  DERANGEMENT OF POSTERIOR HORN OF MEDIAL MENISCUS (ICD-717.2) Assessment New  3 views, RIGHT knee normal  bony anatomy may be a slight hint of medial joint space narrowing, patellofemoral joint is congruent.  Impression normal RIGHT knee  Orders: New Patient Level IV (04540) Knee x-ray,  3 views (98119)  Patient Instructions: 1)  Rest  2)  ICE  3)  IBUPROFEN 4)  MRI RT KNEE return to discus further treatment

## 2010-07-15 NOTE — Letter (Signed)
Summary: Letter to [facility]sch an appt  Letter to [facility] an appt   Imported By: Lind Guest 06/09/2010 12:54:40  _____________________________________________________________________  External Attachment:    Type:   Image     Comment:   External Document

## 2010-10-04 ENCOUNTER — Other Ambulatory Visit: Payer: Self-pay

## 2010-10-04 MED ORDER — TEMAZEPAM 30 MG PO CAPS: 30.0000 mg | ORAL_CAPSULE | Freq: Every evening | ORAL | Status: DC | PRN

## 2010-10-22 ENCOUNTER — Telehealth: Payer: Self-pay | Admitting: Family Medicine

## 2010-10-22 MED ORDER — TRIAMTERENE-HCTZ 37.5-25 MG PO TABS: 1.0000 | ORAL_TABLET | Freq: Every day | ORAL | Status: DC

## 2010-10-22 NOTE — Telephone Encounter (Signed)
One month only sent, patient needs appt. Attempted to call and advise but # disconnected

## 2010-11-04 ENCOUNTER — Other Ambulatory Visit: Payer: Self-pay | Admitting: Family Medicine

## 2010-11-05 LAB — BASIC METABOLIC PANEL
CO2: 26 mEq/L (ref 19–32)
Calcium: 9.5 mg/dL (ref 8.4–10.5)
Chloride: 102 mEq/L (ref 96–112)
Glucose, Bld: 85 mg/dL (ref 70–99)
Potassium: 3.7 mEq/L (ref 3.5–5.3)
Sodium: 141 mEq/L (ref 135–145)

## 2010-11-05 LAB — LIPID PANEL
Cholesterol: 215 mg/dL — ABNORMAL HIGH (ref 0–200)
HDL: 47 mg/dL (ref >39)
LDL Cholesterol: 147 mg/dL — ABNORMAL HIGH (ref 0–99)
Triglycerides: 103 mg/dL (ref <150)

## 2010-11-05 LAB — VITAMIN D 25 HYDROXY (VIT D DEFICIENCY, FRACTURES): Vit D, 25-Hydroxy: 20 ng/mL — ABNORMAL LOW (ref 30–89)

## 2010-11-05 LAB — CBC WITH DIFFERENTIAL/PLATELET
Eosinophils Relative: 2 % (ref 0–5)
HCT: 32.1 % — ABNORMAL LOW (ref 36.0–46.0)
MCH: 30.6 pg (ref 26.0–34.0)
Monocytes Relative: 5 % (ref 3–12)
RBC: 3.53 MIL/uL — ABNORMAL LOW (ref 3.87–5.11)
RDW: 13.1 % (ref 11.5–15.5)
WBC: 8.3 10*3/uL (ref 4.0–10.5)

## 2010-11-30 ENCOUNTER — Encounter: Payer: Self-pay | Admitting: Family Medicine

## 2010-12-02 ENCOUNTER — Ambulatory Visit (HOSPITAL_COMMUNITY)
Admission: RE | Admit: 2010-12-02 | Discharge: 2010-12-02 | Disposition: A | Discharge status: Home / Self Care | Payer: PRIVATE HEALTH INSURANCE | Source: Ambulatory Visit | Attending: Family Medicine | Admitting: Family Medicine

## 2010-12-02 ENCOUNTER — Encounter: Payer: Self-pay | Admitting: Family Medicine

## 2010-12-02 ENCOUNTER — Ambulatory Visit (INDEPENDENT_AMBULATORY_CARE_PROVIDER_SITE_OTHER): Payer: PRIVATE HEALTH INSURANCE | Admitting: Family Medicine

## 2010-12-02 ENCOUNTER — Other Ambulatory Visit: Payer: Self-pay | Admitting: Family Medicine

## 2010-12-02 ENCOUNTER — Encounter (HOSPITAL_COMMUNITY): Payer: Self-pay

## 2010-12-02 VITALS — BP 120/82 | HR 80 | Resp 16 | Ht 67.0 in | Wt 175.4 lb

## 2010-12-02 DIAGNOSIS — I10 Essential (primary) hypertension: Secondary | ICD-10-CM

## 2010-12-02 DIAGNOSIS — E538 Deficiency of other specified B group vitamins: Secondary | ICD-10-CM

## 2010-12-02 DIAGNOSIS — Z139 Encounter for screening, unspecified: Secondary | ICD-10-CM

## 2010-12-02 DIAGNOSIS — E785 Hyperlipidemia, unspecified: Secondary | ICD-10-CM

## 2010-12-02 DIAGNOSIS — E559 Vitamin D deficiency, unspecified: Secondary | ICD-10-CM | POA: Insufficient documentation

## 2010-12-02 DIAGNOSIS — G4700 Insomnia, unspecified: Secondary | ICD-10-CM

## 2010-12-02 DIAGNOSIS — R5383 Other fatigue: Secondary | ICD-10-CM

## 2010-12-02 HISTORY — DX: Essential (primary) hypertension: I10

## 2010-12-02 MED ORDER — TEMAZEPAM 30 MG PO CAPS: 30.0000 mg | ORAL_CAPSULE | Freq: Every evening | ORAL | Status: DC | PRN

## 2010-12-02 MED ORDER — VITAMIN D-3 125 MCG (5000 UT) PO TABS: 50000.0000 [IU] | ORAL_TABLET | ORAL | Status: DC

## 2010-12-02 MED ORDER — TRIAMTERENE-HCTZ 37.5-25 MG PO TABS: 1.0000 | ORAL_TABLET | Freq: Every day | ORAL | Status: DC

## 2010-12-02 MED ORDER — PRAVASTATIN SODIUM 20 MG PO TABS: 20.0000 mg | ORAL_TABLET | Freq: Every evening | ORAL | Status: DC

## 2010-12-02 NOTE — Progress Notes (Signed)
  Subjective:    Patient ID: Lori Sims, female    DOB: 03-Jan-1961, 50 y.o.   MRN: [id number]  HPI Pt reports 1 monh h/o shortness of breath, denies chest pain, palpitation, pND or orthopnea or leg edema. Pt has stopped fluoxetine, mentally in a good place and no longer depressed. Had  A bad day this past father's day,  Missed her Dad. No regular exercise. Had diareah yesterday x 4 episodes, after eating a salad.     Review of Systems    Denies recent fever or chills. Denies sinus pressure, nasal congestion, ear pain or sore throat.Intermittent allergy symptoms , uses flonase as needed Denies chest congestion, productive cough or wheezing. Denies chest pains, palpitations, paroxysmal nocturnal dyspnea, orthopnea and leg swelling Denies abdominal pain, nausea, vomiting,diarrhea or constipation.  Denies rectal bleeding or change in bowel movement. Denies dysuria, frequency, hesitancy or incontinence. Some neck stiffness, on the computer all day, knee has improved.Denies headaches, seizure, numbness, or tingling. Denies depression or  Anxiety, takes med for  insomnia. Denies skin break down or rash.     Objective:   Physical Exam Patient alert and oriented and in no Cardiopulmonary distress.  HEENT: No facial asymmetry, EOMI, no sinus tenderness, TM's clear, Oropharynx pink and moist.  Neck supple no adenopathy.  Chest: Clear to auscultation bilaterally.  CVS: S1, S2 no murmurs, no S3.  ABD: Soft non tender. Bowel sounds normal.  Ext: No edema  MS: Adequate ROM spine, shoulders, hips and knees.  Skin: Intact, no ulcerations or rash noted.  Psych: Good eye contact, normal affect. Memory intact not anxious or depressed appearing.  CNS: CN 2-12 intact, power, tone and sensation normal throughout.        Assessment & Plan:

## 2010-12-02 NOTE — Assessment & Plan Note (Signed)
New diagnosis, pt to start weekly and daily replacement

## 2010-12-02 NOTE — Assessment & Plan Note (Signed)
Low fat diet discussed, pt to start med once hepatic panel is normal

## 2010-12-02 NOTE — Assessment & Plan Note (Signed)
Controlled, no change in medication Sleep hygiene discussed 

## 2010-12-02 NOTE — Patient Instructions (Addendum)
cPE in 3.5 months.  Fasting lipid, hepatic and chem 7, Vit D in 4 months   Labs today  Hepatic , anemia panel and TSH  pls start Vit D once weekly prescription , also once daily OTC calcium with D  One MVI one daily is recommended also  A healthy diet is rich in fruit, vegetables and whole grains. Poultry fish, nuts and beans are a healthy choice for protein rather then red meat. A low sodium diet and drinking 64 ounces of water daily is generally recommended. Oils and sweet should be limited. Carbohydrates especially for those who are diabetic or overweight, should be limited to 30-45 gram per meal. It is important to eat on a regular schedule, at least 3 times daily. Snacks should be primarily fruits, vegetables or nuts.  It is important that you exercise regularly at least 30 minutes 5 times a week. If you develop chest pain, have severe difficulty breathing, or feel very tired, stop exercising immediately and seek medical attention   CXR,ordered  and mammogram will be scheduled

## 2010-12-02 NOTE — Assessment & Plan Note (Signed)
Controlled, no change in medication  

## 2010-12-03 LAB — HEPATIC FUNCTION PANEL
Albumin: 4.2 g/dL (ref 3.5–5.2)
Total Protein: 7.9 g/dL (ref 6.0–8.3)

## 2010-12-03 LAB — IRON AND TIBC
%SAT: 15 % — ABNORMAL LOW (ref 20–55)
Iron: 47 ug/dL (ref 42–145)
TIBC: 309 ug/dL (ref 250–470)

## 2010-12-03 LAB — FOLATE: Folate: 11.5 ng/mL

## 2010-12-06 ENCOUNTER — Telehealth: Payer: Self-pay | Admitting: Family Medicine

## 2010-12-06 ENCOUNTER — Ambulatory Visit (HOSPITAL_COMMUNITY)
Admission: RE | Admit: 2010-12-06 | Discharge: 2010-12-06 | Disposition: A | Discharge status: Home / Self Care | Payer: PRIVATE HEALTH INSURANCE | Source: Ambulatory Visit | Attending: Family Medicine | Admitting: Family Medicine

## 2010-12-06 DIAGNOSIS — Z1231 Encounter for screening mammogram for malignant neoplasm of breast: Secondary | ICD-10-CM | POA: Insufficient documentation

## 2010-12-06 DIAGNOSIS — Z139 Encounter for screening, unspecified: Secondary | ICD-10-CM

## 2010-12-06 NOTE — Telephone Encounter (Signed)
Patient aware results are normal.

## 2010-12-09 ENCOUNTER — Ambulatory Visit (HOSPITAL_COMMUNITY): Payer: PRIVATE HEALTH INSURANCE

## 2011-04-05 ENCOUNTER — Encounter: Payer: Self-pay | Admitting: Family Medicine

## 2011-04-06 ENCOUNTER — Ambulatory Visit (INDEPENDENT_AMBULATORY_CARE_PROVIDER_SITE_OTHER): Payer: PRIVATE HEALTH INSURANCE | Admitting: Family Medicine

## 2011-04-06 ENCOUNTER — Encounter: Payer: Self-pay | Admitting: Family Medicine

## 2011-04-06 VITALS — BP 112/78 | HR 78 | Resp 16 | Ht 67.0 in | Wt 171.8 lb

## 2011-04-06 DIAGNOSIS — N809 Endometriosis, unspecified: Secondary | ICD-10-CM | POA: Insufficient documentation

## 2011-04-06 DIAGNOSIS — G4700 Insomnia, unspecified: Secondary | ICD-10-CM

## 2011-04-06 DIAGNOSIS — I10 Essential (primary) hypertension: Secondary | ICD-10-CM

## 2011-04-06 DIAGNOSIS — J309 Allergic rhinitis, unspecified: Secondary | ICD-10-CM

## 2011-04-06 DIAGNOSIS — R3915 Urgency of urination: Secondary | ICD-10-CM

## 2011-04-06 DIAGNOSIS — E785 Hyperlipidemia, unspecified: Secondary | ICD-10-CM

## 2011-04-06 LAB — POCT URINALYSIS DIPSTICK
Glucose, UA: NEGATIVE
Leukocytes, UA: NEGATIVE

## 2011-04-06 MED ORDER — TRIAMTERENE-HCTZ 37.5-25 MG PO TABS: 1.0000 | ORAL_TABLET | Freq: Every day | ORAL | Status: DC

## 2011-04-06 MED ORDER — LORATADINE 10 MG PO TABS: 10.0000 mg | ORAL_TABLET | Freq: Every day | ORAL | Status: DC

## 2011-04-06 MED ORDER — VITAMIN D-3 125 MCG (5000 UT) PO TABS: 50000.0000 [IU] | ORAL_TABLET | ORAL | Status: DC

## 2011-04-06 MED ORDER — PRAVASTATIN SODIUM 20 MG PO TABS: 20.0000 mg | ORAL_TABLET | Freq: Every day | ORAL | Status: DC

## 2011-04-06 MED ORDER — TEMAZEPAM 30 MG PO CAPS: 30.0000 mg | ORAL_CAPSULE | Freq: Every evening | ORAL | Status: DC | PRN

## 2011-04-06 MED ORDER — PRAVASTATIN SODIUM 20 MG PO TABS: 20.0000 mg | ORAL_TABLET | Freq: Every evening | ORAL | Status: DC

## 2011-04-06 NOTE — Patient Instructions (Signed)
F/U in 5.5 months.  Pls get labs asap.  You are being referred to Dr . Cherly Hensen in [place] in January anbout the endometriosis and heavy menses.  Prescriptions are printed and will be given to you for loratidine, pravstatin and triamterene, they are all for $10 each withiout insurance  It is important that you exercise regularly at least 30 minutes 5 times a week. If you develop chest pain, have severe difficulty breathing, or feel very tired, stop exercising immediately and seek medical attention  A healthy diet is rich in fruit, vegetables and whole grains. Poultry fish, nuts and beans are a healthy choice for protein rather then red meat. A low sodium diet and drinking 64 ounces of water daily is generally recommended. Oils and sweet should be limited. Carbohydrates especially for those who are diabetic or overweight, should be limited to 34-45 gram per meal. It is important to eat on a regular schedule, at least 3 times daily. Snacks should be primarily fruits, vegetables or nuts.   Urine does not show any sign of infection

## 2011-04-06 NOTE — Assessment & Plan Note (Signed)
cCUA checked in office and is normal 

## 2011-04-06 NOTE — Assessment & Plan Note (Signed)
Sleep hygiene discussed, pt to continue med as before

## 2011-04-06 NOTE — Progress Notes (Signed)
  Subjective:    Patient ID: Lori Sims, female    DOB: 1960/09/07, 50 y.o.   MRN: [id number]  HPI C/o excessive bleeding , irregular cycles and menstrual pain with endometriosis up to a 10, worsening in the  Past 6 months, has pain even with urination. Currently on menses which are heavy, was scheduled for pap, now seriously contemplating hysterectomy due to bleeding and pain which are recurrent. Will see gynae in the New year for eval and pap. Otherwise doing well, does experience some arthritic aches and pains which are not disabling   Review of Systems Denies recent fever or chills. Denies sinus pressure, nasal congestion, ear pain or sore throat. Denies chest congestion, productive cough or wheezing. Denies chest pains, palpitations and leg swelling Denies abdominal pain, nausea, vomiting,diarrhea or constipation.   C/o dysuria x 3 days. Denies joint pain, swelling and limitation in mobility. Denies headaches, seizures, numbness, or tingling. Denies depression or  anxiety , insomnia is controlled with medication Denies skin break down or rash.        Objective:   Physical Exam  Patient alert and oriented and in no cardiopulmonary distress.  HEENT: No facial asymmetry, EOMI, no sinus tenderness,  oropharynx pink and moist.  Neck supple no adenopathy.  Chest: Clear to auscultation bilaterally.  CVS: S1, S2 no murmurs, no S3.  ABD: Soft non tender. Bowel sounds normal.  Ext: No edema  MS: Adequate ROM spine, shoulders, hips and knees.  Skin: Intact, no ulcerations or rash noted.  Psych: Good eye contact, normal affect. Memory intact not anxious or depressed appearing.  CNS: CN 2-12 intact, power, tone and sensation normal throughout.       Assessment & Plan:

## 2011-04-06 NOTE — Assessment & Plan Note (Signed)
Deteriorated, worsening [pain symptoms, including with urination,m also periods increasingly heavy and irregular, seriously considering hysterectomy. Referred to gynae for further eval

## 2011-04-06 NOTE — Assessment & Plan Note (Signed)
Increased symptoms anticipated during the Fall, scripts given for loratidine

## 2011-04-13 ENCOUNTER — Other Ambulatory Visit: Payer: Self-pay | Admitting: Family Medicine

## 2011-04-13 LAB — BASIC METABOLIC PANEL
CO2: 29 mEq/L (ref 19–32)
Chloride: 101 mEq/L (ref 96–112)
Potassium: 3.8 mEq/L (ref 3.5–5.3)
Sodium: 141 mEq/L (ref 135–145)

## 2011-04-13 LAB — LIPID PANEL
Cholesterol: 188 mg/dL (ref 0–200)
Triglycerides: 95 mg/dL (ref <150)

## 2011-04-13 LAB — HEPATIC FUNCTION PANEL
ALT: 9 U/L (ref 0–35)
AST: 16 U/L (ref 0–37)
Albumin: 4.1 g/dL (ref 3.5–5.2)
Total Protein: 7 g/dL (ref 6.0–8.3)

## 2011-04-13 LAB — VITAMIN D 25 HYDROXY (VIT D DEFICIENCY, FRACTURES): Vit D, 25-Hydroxy: 66 ng/mL (ref 30–89)

## 2011-06-06 ENCOUNTER — Telehealth: Payer: Self-pay | Admitting: Family Medicine

## 2011-06-06 NOTE — Telephone Encounter (Signed)
Called into the pharmacy voicemail

## 2011-06-15 ENCOUNTER — Telehealth: Payer: Self-pay

## 2011-06-15 NOTE — Telephone Encounter (Signed)
Coughing a lot, no phlegm production (dry)  headache, no appetite, has been having hot and cold chills, some fever. I recommended tylenol and robitussin and she was wanting to know if she could be worked in. [place] had advised earlier that we would call with any cancellations. She said she would try the Robitussin in the meantime. She is at work but still wanted to check with you  [phone]

## 2011-06-15 NOTE — Telephone Encounter (Signed)
pls give hear work in apppt by next Monday or Tuesday

## 2011-06-16 ENCOUNTER — Ambulatory Visit: Payer: PRIVATE HEALTH INSURANCE | Admitting: Family Medicine

## 2011-06-16 ENCOUNTER — Encounter: Payer: Self-pay | Admitting: Family Medicine

## 2011-06-16 NOTE — Telephone Encounter (Signed)
Coming in today

## 2011-06-17 ENCOUNTER — Encounter: Payer: Self-pay | Admitting: Family Medicine

## 2011-06-17 ENCOUNTER — Ambulatory Visit (INDEPENDENT_AMBULATORY_CARE_PROVIDER_SITE_OTHER): Payer: PRIVATE HEALTH INSURANCE | Admitting: Family Medicine

## 2011-06-17 VITALS — BP 120/80 | HR 82 | Temp 98.7°F | Resp 16 | Ht 67.0 in | Wt 170.4 lb

## 2011-06-17 DIAGNOSIS — J019 Acute sinusitis, unspecified: Secondary | ICD-10-CM | POA: Insufficient documentation

## 2011-06-17 DIAGNOSIS — I10 Essential (primary) hypertension: Secondary | ICD-10-CM

## 2011-06-17 DIAGNOSIS — J40 Bronchitis, not specified as acute or chronic: Secondary | ICD-10-CM | POA: Insufficient documentation

## 2011-06-17 MED ORDER — BENZONATATE 100 MG PO CAPS: 100.0000 mg | ORAL_CAPSULE | Freq: Four times a day (QID) | ORAL | Status: DC | PRN

## 2011-06-17 MED ORDER — SULFAMETHOXAZOLE-TRIMETHOPRIM 800-160 MG PO TABS: 1.0000 | ORAL_TABLET | Freq: Two times a day (BID) | ORAL | Status: DC

## 2011-06-17 MED ORDER — SULFAMETHOXAZOLE-TRIMETHOPRIM 800-160 MG PO TABS: 1.0000 | ORAL_TABLET | Freq: Two times a day (BID) | ORAL | Status: AC

## 2011-06-17 MED ORDER — BENZONATATE 100 MG PO CAPS: 100.0000 mg | ORAL_CAPSULE | Freq: Four times a day (QID) | ORAL | Status: AC | PRN

## 2011-06-17 NOTE — Progress Notes (Signed)
Subjective:     Patient ID: Lori Sims, female   DOB: Jun 01, 1961, 51 y.o.   MRN: [id number]  HPI 4 day h/o head and chest congestion with fever and chills, clear drainage , no .scant sputum production which is slightly yellow.Short of breath, fatigue. C/o increased fatigue, poor apetitie  Review of Systems See HPI  Denies chest pains, palpitations and leg swelling Denies abdominal pain, nausea, vomiting,diarrhea or constipation.   Denies dysuria, frequency, hesitancy or incontinence. Denies joint pain, swelling and limitation in mobility. Denies headaches, seizures, numbness, or tingling. Denies depression, anxiety or insomnia. Denies skin break down or rash.        Objective:   Physical Exam Patient alert and oriented and ill appearing.  HEENT: No facial asymmetry, EOMI, maxillary sinus tenderness,  oropharynx pink and moist.  Neck supple anterior cervical  adenopathy.  Chest: Clear to auscultation decreased air entry, scattered crackles.  CVS: S1, S2 no murmurs, no S3.  ABD: Soft non tender. Bowel sounds normal.  Ext: No edema  MS: Adequate ROM spine, shoulders, hips and knees.  Skin: Intact, no ulcerations or rash noted.  Psych: Good eye contact, normal affect. Memory intact not anxious or depressed appearing.  CNS: CN 2-12 intact, power, tone and sensation normal throughout.     Assessment:         Plan:

## 2011-06-17 NOTE — Patient Instructions (Signed)
cPE as before. You are being treated for sinusitis and bronchitis.  Please take med as prescribed, drink a lot of water and get rest.  Work excuse from today to return on 06/20/2011  i hope you feel better soon

## 2011-06-18 NOTE — Assessment & Plan Note (Signed)
Acute infection, antibiotics prescribed, also work excuse

## 2011-06-18 NOTE — Assessment & Plan Note (Signed)
Controlled, no change in medication  

## 2011-06-18 NOTE — Assessment & Plan Note (Signed)
Acute infection, antibiotics and decongestants prescribed 

## 2011-09-06 ENCOUNTER — Telehealth: Payer: Self-pay | Admitting: Family Medicine

## 2011-09-06 DIAGNOSIS — R5383 Other fatigue: Secondary | ICD-10-CM

## 2011-09-06 DIAGNOSIS — I10 Essential (primary) hypertension: Secondary | ICD-10-CM

## 2011-09-06 DIAGNOSIS — E785 Hyperlipidemia, unspecified: Secondary | ICD-10-CM

## 2011-09-06 MED ORDER — TEMAZEPAM 30 MG PO CAPS: 30.0000 mg | ORAL_CAPSULE | Freq: Every evening | ORAL | Status: DC | PRN

## 2011-09-06 NOTE — Telephone Encounter (Signed)
What labs does she need before her next visit?

## 2011-09-06 NOTE — Telephone Encounter (Signed)
Pt aware. Labs sent to [facility].

## 2011-09-06 NOTE — Telephone Encounter (Signed)
Cbc, fasting chem 7 and lipid and hepatic

## 2012-01-02 ENCOUNTER — Other Ambulatory Visit: Payer: Self-pay | Admitting: Family Medicine

## 2012-01-11 ENCOUNTER — Encounter: Payer: Self-pay | Admitting: Family Medicine

## 2012-01-11 ENCOUNTER — Other Ambulatory Visit: Payer: Self-pay | Admitting: Family Medicine

## 2012-01-11 ENCOUNTER — Ambulatory Visit (INDEPENDENT_AMBULATORY_CARE_PROVIDER_SITE_OTHER): Payer: BC Managed Care – PPO | Admitting: Family Medicine

## 2012-01-11 VITALS — BP 120/80 | HR 73 | Resp 15 | Ht 67.0 in | Wt 170.4 lb

## 2012-01-11 DIAGNOSIS — Z139 Encounter for screening, unspecified: Secondary | ICD-10-CM

## 2012-01-11 DIAGNOSIS — G4700 Insomnia, unspecified: Secondary | ICD-10-CM

## 2012-01-11 DIAGNOSIS — N809 Endometriosis, unspecified: Secondary | ICD-10-CM

## 2012-01-11 DIAGNOSIS — I10 Essential (primary) hypertension: Secondary | ICD-10-CM

## 2012-01-11 DIAGNOSIS — E785 Hyperlipidemia, unspecified: Secondary | ICD-10-CM

## 2012-01-11 MED ORDER — ALPRAZOLAM 0.25 MG PO TABS: ORAL_TABLET | ORAL | Status: DC

## 2012-01-11 MED ORDER — TRIAMTERENE-HCTZ 37.5-25 MG PO TABS: 1.0000 | ORAL_TABLET | Freq: Every day | ORAL | Status: DC

## 2012-01-11 MED ORDER — TEMAZEPAM 30 MG PO CAPS: 30.0000 mg | ORAL_CAPSULE | Freq: Every evening | ORAL | Status: DC | PRN

## 2012-01-11 NOTE — Patient Instructions (Addendum)
CPE in 3 month  Mammogram to be scheduled at exit.  Fasting labs past due please do today.   It is important that you exercise regularly at least 30 minutes 7 times a week. If you develop chest pain, have severe difficulty breathing, or feel very tired, stop exercising immediately and seek medical attention   A healthy diet is rich in fruit, vegetables and whole grains. Poultry fish, nuts and beans are a healthy choice for protein rather then red meat. A low sodium diet and drinking 64 ounces of water daily is generally recommended. Oils and sweet should be limited. Carbohydrates especially for those who are diabetic or overweight, should be limited to 30-45 gram per meal. It is important to eat on a regular schedule, at least 3 times daily. Snacks should be primarily fruits, vegetables or nuts.  New additional medication to help with sleep

## 2012-01-12 ENCOUNTER — Telehealth: Payer: Self-pay | Admitting: Family Medicine

## 2012-01-12 NOTE — Telephone Encounter (Signed)
Faxed to [facility]

## 2012-01-15 NOTE — Progress Notes (Signed)
  Subjective:    Patient ID: Lori Sims, female    DOB: 11/19/1960, 51 y.o.   MRN: [id number]  HPI The PT is here for follow up and re-evaluation of chronic medical conditions, medication management and review of any available recent lab and radiology data.  Preventive health is updated, specifically  Cancer screening and Immunization.   Questions or concerns regarding consultations or procedures which the PT has had in the interim are  addressed. The PT denies any adverse reactions to current medications since the last visit.  There are no new concerns.  Still experiencing insomnia, falls asleep , but does not stay asleep, requesting medication to assist with this, No specific increased stress os anxiety     Review of Systems See HPI Denies recent fever or chills. Denies sinus pressure, nasal congestion, ear pain or sore throat. Denies chest congestion, productive cough or wheezing. Denies chest pains, palpitations and leg swelling Denies abdominal pain, nausea, vomiting,diarrhea or constipation.   Denies dysuria, frequency, hesitancy or incontinence. Denies joint pain, swelling and limitation in mobility. Denies headaches, seizures, numbness, or tingling. Denies depression,  Denies skin break down or rash.        Objective:   Physical Exam  Patient alert and oriented and in no cardiopulmonary distress.  HEENT: No facial asymmetry, EOMI, no sinus tenderness,  oropharynx pink and moist.  Neck supple no adenopathy.  Chest: Clear to auscultation bilaterally.  CVS: S1, S2 no murmurs, no S3.  ABD: Soft non tender. Bowel sounds normal.  Ext: No edema  MS: Adequate ROM spine, shoulders, hips and knees.  Skin: Intact, no ulcerations or rash noted.  Psych: Good eye contact, normal affect. Memory intact not anxious or depressed appearing.  CNS: CN 2-12 intact, power, tone and sensation normal throughout.       Assessment & Plan:

## 2012-01-15 NOTE — Assessment & Plan Note (Signed)
Uncontrolled, add low dose of xanax, sleep hygiene also reviewed

## 2012-01-15 NOTE — Assessment & Plan Note (Signed)
Hyperlipidemia:Low fat diet discussed and encouraged.  Updated labs needed 

## 2012-01-15 NOTE — Assessment & Plan Note (Signed)
Controlled, no change in medication DASH diet and commitment to daily physical activity for a minimum of 30 minutes discussed and encouraged, as a part of hypertension management. The importance of attaining a healthy weight is also discussed.  

## 2012-01-15 NOTE — Assessment & Plan Note (Signed)
Still having regular menses to her distress, esp as they are painful

## 2012-01-16 ENCOUNTER — Ambulatory Visit (HOSPITAL_COMMUNITY)
Admission: RE | Admit: 2012-01-16 | Discharge: 2012-01-16 | Disposition: A | Discharge status: Home / Self Care | Payer: BC Managed Care – PPO | Source: Ambulatory Visit | Attending: Family Medicine | Admitting: Family Medicine

## 2012-01-16 DIAGNOSIS — Z1231 Encounter for screening mammogram for malignant neoplasm of breast: Secondary | ICD-10-CM | POA: Insufficient documentation

## 2012-01-16 DIAGNOSIS — Z139 Encounter for screening, unspecified: Secondary | ICD-10-CM

## 2012-01-25 LAB — BASIC METABOLIC PANEL
CO2: 29 mEq/L (ref 19–32)
Calcium: 9.2 mg/dL (ref 8.4–10.5)
Chloride: 105 mEq/L (ref 96–112)
Sodium: 142 mEq/L (ref 135–145)

## 2012-01-25 LAB — LIPID PANEL
HDL: 48 mg/dL (ref >39)
LDL Cholesterol: 116 mg/dL — ABNORMAL HIGH (ref 0–99)
Total CHOL/HDL Ratio: 3.8 Ratio

## 2012-01-25 LAB — HEPATIC FUNCTION PANEL
AST: 15 U/L (ref 0–37)
Albumin: 3.9 g/dL (ref 3.5–5.2)
Total Bilirubin: 0.3 mg/dL (ref 0.3–1.2)

## 2012-01-25 LAB — CBC WITH DIFFERENTIAL/PLATELET
Basophils Absolute: 0 10*3/uL (ref 0.0–0.1)
HCT: 33.8 % — ABNORMAL LOW (ref 36.0–46.0)
Lymphocytes Relative: 40 % (ref 12–46)
Monocytes Absolute: 0.4 10*3/uL (ref 0.1–1.0)
Neutro Abs: 4.1 10*3/uL (ref 1.7–7.7)
Neutrophils Relative %: 52 % (ref 43–77)
RDW: 13.4 % (ref 11.5–15.5)
WBC: 7.9 10*3/uL (ref 4.0–10.5)

## 2012-02-03 ENCOUNTER — Telehealth: Payer: Self-pay | Admitting: Family Medicine

## 2012-02-03 MED ORDER — FLUTICASONE PROPIONATE 50 MCG/ACT NA SUSP: 2.0000 | Freq: Every day | NASAL | Status: DC

## 2012-02-03 NOTE — Telephone Encounter (Signed)
Refill sent in

## 2012-02-14 ENCOUNTER — Other Ambulatory Visit: Payer: Self-pay

## 2012-02-14 MED ORDER — TEMAZEPAM 30 MG PO CAPS: 30.0000 mg | ORAL_CAPSULE | Freq: Every evening | ORAL | Status: DC | PRN

## 2012-03-15 ENCOUNTER — Telehealth: Payer: Self-pay | Admitting: Family Medicine

## 2012-03-16 ENCOUNTER — Other Ambulatory Visit: Payer: Self-pay | Admitting: Family Medicine

## 2012-03-16 ENCOUNTER — Telehealth: Payer: Self-pay | Admitting: Family Medicine

## 2012-03-16 DIAGNOSIS — G4700 Insomnia, unspecified: Secondary | ICD-10-CM

## 2012-03-16 MED ORDER — TEMAZEPAM 30 MG PO CAPS: 30.0000 mg | ORAL_CAPSULE | Freq: Every evening | ORAL | Status: DC | PRN

## 2012-03-16 MED ORDER — ZOLPIDEM TARTRATE 10 MG PO TABS: 10.0000 mg | ORAL_TABLET | Freq: Every evening | ORAL | Status: DC | PRN

## 2012-03-16 MED ORDER — ALPRAZOLAM 0.25 MG PO TABS: ORAL_TABLET | ORAL | Status: DC

## 2012-03-16 NOTE — Telephone Encounter (Signed)
New script printed for zolpidem pls fax and let her know

## 2012-03-16 NOTE — Telephone Encounter (Signed)
Printed to be sent in once signed

## 2012-03-16 NOTE — Telephone Encounter (Signed)
Said the temazepam doesn't help her sleep. Wants it changed to something else. Please advise

## 2012-03-16 NOTE — Telephone Encounter (Signed)
Called and left message for pt patient notifying patient of new rx.  Med sent.

## 2012-05-17 ENCOUNTER — Other Ambulatory Visit: Payer: Self-pay | Admitting: Family Medicine

## 2012-05-21 ENCOUNTER — Encounter: Payer: Self-pay | Admitting: Family Medicine

## 2012-05-21 ENCOUNTER — Other Ambulatory Visit (HOSPITAL_COMMUNITY)
Admission: RE | Admit: 2012-05-21 | Discharge: 2012-05-21 | Disposition: A | Discharge status: Home / Self Care | Payer: BC Managed Care – PPO | Source: Ambulatory Visit | Attending: Family Medicine | Admitting: Family Medicine

## 2012-05-21 ENCOUNTER — Ambulatory Visit (INDEPENDENT_AMBULATORY_CARE_PROVIDER_SITE_OTHER): Payer: BC Managed Care – PPO | Admitting: Family Medicine

## 2012-05-21 VITALS — BP 120/82 | HR 74 | Resp 15 | Ht 67.0 in | Wt 169.1 lb

## 2012-05-21 DIAGNOSIS — M25569 Pain in unspecified knee: Secondary | ICD-10-CM

## 2012-05-21 DIAGNOSIS — R5383 Other fatigue: Secondary | ICD-10-CM

## 2012-05-21 DIAGNOSIS — E785 Hyperlipidemia, unspecified: Secondary | ICD-10-CM

## 2012-05-21 DIAGNOSIS — Z01419 Encounter for gynecological examination (general) (routine) without abnormal findings: Secondary | ICD-10-CM | POA: Insufficient documentation

## 2012-05-21 DIAGNOSIS — R5381 Other malaise: Secondary | ICD-10-CM

## 2012-05-21 DIAGNOSIS — Z0000 Encounter for general adult medical examination without abnormal findings: Secondary | ICD-10-CM

## 2012-05-21 DIAGNOSIS — I10 Essential (primary) hypertension: Secondary | ICD-10-CM

## 2012-05-21 DIAGNOSIS — Z1151 Encounter for screening for human papillomavirus (HPV): Secondary | ICD-10-CM | POA: Insufficient documentation

## 2012-05-21 DIAGNOSIS — G4700 Insomnia, unspecified: Secondary | ICD-10-CM

## 2012-05-21 DIAGNOSIS — Z1211 Encounter for screening for malignant neoplasm of colon: Secondary | ICD-10-CM

## 2012-05-21 LAB — POC HEMOCCULT BLD/STL (OFFICE/1-CARD/DIAGNOSTIC): Fecal Occult Blood, POC: NEGATIVE

## 2012-05-21 MED ORDER — ZOLPIDEM TARTRATE 10 MG PO TABS: 10.0000 mg | ORAL_TABLET | Freq: Every evening | ORAL | Status: DC | PRN

## 2012-05-21 MED ORDER — ALPRAZOLAM 0.25 MG PO TABS: ORAL_TABLET | ORAL | Status: DC

## 2012-05-21 MED ORDER — PRAVASTATIN SODIUM 40 MG PO TABS: 40.0000 mg | ORAL_TABLET | Freq: Every evening | ORAL | Status: DC

## 2012-05-21 MED ORDER — TRIAMTERENE-HCTZ 37.5-25 MG PO TABS: 1.0000 | ORAL_TABLET | Freq: Every day | ORAL | Status: DC

## 2012-05-21 MED ORDER — PRAVASTATIN SODIUM 40 MG PO TABS: 40.0000 mg | ORAL_TABLET | Freq: Every day | ORAL | Status: DC

## 2012-05-21 NOTE — Patient Instructions (Addendum)
F/u in 4.5 month, please call if you need me before  You will get info to log in on "my chart" to view labs and radiology reports from home, please do so,   Your last colonoscopy was normal in 2010, except foer hemmorhoids, next due in 2020 unless there are problems.  New dose pravastatin is40mg  , you will receive 90 day script.  Please commit to daily physical activity to improve health  Fasting lipid and cmp, tsh and vit D in 4.5 months BEFORE visit

## 2012-05-21 NOTE — Assessment & Plan Note (Signed)
Pelvic and breast exam completed and documented, also rectal exam for colon screening. The importance of daily exercise and a healthy diet was also discussed. Pt refused  Flu vaccine. Labs reviewed and her pravastatin dose is to be increased

## 2012-05-21 NOTE — Progress Notes (Signed)
  Subjective:    Patient ID: Lori Sims, female    DOB: 08-05-1960, 51 y.o.   MRN: [id number]  HPI The PT is here for annual exam and re-evaluation of chronic medical conditions, medication management and review of any available recent lab and radiology data.  Preventive health is updated, specifically  Cancer screening and Immunization.   Questions or concerns regarding consultations or procedures which the PT has had in the interim are  addressed. The PT denies any adverse reactions to current medications since the last visit.  There are no new concerns. Still has significant insomnia, despite good sleep hygiene and medication. Had a 3 month spell with no cycle till thanksgiving.     Review of Systems See HPI Denies recent fever or chills. Denies sinus pressure, nasal congestion, ear pain or sore throat. Denies chest congestion, productive cough or wheezing. Denies chest pains, palpitations and leg swelling Denies abdominal pain, nausea, vomiting,diarrhea or constipation.   Denies dysuria, frequency, hesitancy or incontinence. Intermittent low back pain with h/o pinched nerve. Denies headaches, seizures, numbness, or tingling. Denies depression or  anxiety or insomnia. Denies skin break down or rash.        Objective:   Physical Exam Pleasant well nourished female, alert and oriented x 3, in no cardio-pulmonary distress. Afebrile. HEENT No facial trauma or asymetry. Sinuses non tender.  EOMI, PERTL, fundoscopic exam is normal, no hemorhage or exudate.  External ears normal, tympanic membranes clear. Oropharynx moist, no exudate, good dentition. Neck: supple, no adenopathy,JVD or thyromegaly.No bruits.  Chest: Clear to ascultation bilaterally.No crackles or wheezes. Non tender to palpation  Breast: No asymetry,no masses. No nipple discharge or inversion. No axillary or supraclavicular adenopathy  Cardiovascular system; Heart sounds normal,  S1 and  S2 ,no S3.   No murmur, or thrill. Apical beat not displaced Peripheral pulses normal.  Abdomen: Soft, non tender, no organomegaly or masses. No bruits. Bowel sounds normal. No guarding, tenderness or rebound.  Rectal:  No mass. Guaiac negative stool.  GU: External genitalia normal. No lesions. Vaginal canal normal.No discharge. Uterus normal size, no adnexal masses, no cervical motion or adnexal tenderness.  Musculoskeletal exam: Full ROM of spine, hips , shoulders and knees. No deformity ,swelling or crepitus noted. No muscle wasting or atrophy.   Neurologic: Cranial nerves 2 to 12 intact. Power, tone ,sensation and reflexes normal throughout. No disturbance in gait. No tremor.  Skin: Intact, no ulceration, erythema , scaling or rash noted. Pigmentation normal throughout  Psych; Normal mood and affect. Judgement and concentration normal        Assessment & Plan:

## 2012-06-07 ENCOUNTER — Encounter: Payer: Self-pay | Admitting: Family Medicine

## 2012-06-07 ENCOUNTER — Ambulatory Visit (INDEPENDENT_AMBULATORY_CARE_PROVIDER_SITE_OTHER): Payer: BC Managed Care – PPO | Admitting: Family Medicine

## 2012-06-07 VITALS — BP 110/78 | HR 77 | Resp 16 | Ht 67.0 in | Wt 167.1 lb

## 2012-06-07 DIAGNOSIS — IMO0001 Reserved for inherently not codable concepts without codable children: Secondary | ICD-10-CM

## 2012-06-07 DIAGNOSIS — Z789 Other specified health status: Secondary | ICD-10-CM

## 2012-06-07 DIAGNOSIS — N390 Urinary tract infection, site not specified: Secondary | ICD-10-CM

## 2012-06-07 LAB — POCT URINALYSIS DIPSTICK
Nitrite, UA: POSITIVE
Protein, UA: >300
Urobilinogen, UA: 1

## 2012-06-07 MED ORDER — CEPHALEXIN 500 MG PO CAPS: 500.0000 mg | ORAL_CAPSULE | Freq: Two times a day (BID) | ORAL | Status: AC

## 2012-06-07 NOTE — Assessment & Plan Note (Signed)
Antibiotics, increase water

## 2012-06-07 NOTE — Patient Instructions (Addendum)
  Antibiotics prescribed for urine infection Urinary Tract Infection A urinary tract infection (UTI) is often caused by a germ (bacteria). A UTI is usually helped with medicine (antibiotics) that kills germs. Take all the medicine until it is gone. Do this even if you are feeling better. You are usually better in 7 to 10 days. HOME CARE    Drink enough water and fluids to keep your pee (urine) clear or pale yellow. Drink:   Cranberry juice.   Water.   Avoid:   Caffeine.   Tea.   Bubbly (carbonated) drinks.   Alcohol.   Only take medicine as told by your doctor.   To prevent further infections:   Pee often.   After pooping (bowel movement), women should wipe from front to back. Use each tissue only once.   Pee before and after having sex (intercourse).  Ask your doctor when your test results will be ready. Make sure you follow up and get your test results.   GET HELP RIGHT AWAY IF:    There is very bad back pain or lower belly (abdominal) pain.   You get the chills.   You have a fever.   Your baby is older than 3 months with a rectal temperature of 102 F (38.9 C) or higher.   Your baby is 59 months old or younger with a rectal temperature of 100.4 F (38 C) or higher.   You feel sick to your stomach (nauseous) or throw up (vomit).   There is continued burning with peeing.   Your problems are not better in 3 days. Return sooner if you are getting worse.  MAKE SURE YOU:    Understand these instructions.   Will watch your condition.   Will get help right away if you are not doing well or get worse.  Document Released: 11/16/2007 Document Revised: 08/22/2011 Document Reviewed: 11/16/2007 [facility] Patient Information 2013 [place], [place].

## 2012-06-07 NOTE — Progress Notes (Signed)
  Subjective:    Patient ID: Lori Sims, female    DOB: 31-Mar-1961, 51 y.o.   MRN: [id number]  HPI Dysuria x 2 days, noted blood in urine, large amount, states she is wearing a pad but does not think it is vaginal bleeding, cycles are irregular, last menses Nov 27th. Mild abdominal cramping, no fever, N/V, no blood in stools   Review of Systems  GEN- denies fatigue, fever, weight loss,weakness, recent illness HEENT- denies eye drainage, change in vision, nasal discharge, CVS- denies chest pain, palpitations RESP- denies SOB, cough, wheeze ABD- denies N/V, change in stools, abd pain GU- denies dysuria, hematuria, dribbling, incontinence MSK- denies joint pain, muscle aches, injury Neuro- denies headache, dizziness, syncope, seizure activity      Objective:   Physical Exam GEN- NAD, alert and oriented, CVS-RRR,  No murmur RESP-CTAB ABD-NABS,soft,NT,ND, mild suprapubic tenderness, no CVA tenderness GU- normal external genitalia, vaginal mucosa pink and moist, cervix visualized no growth, + blood form os, no discharge no CMT, no ovarian masses, uterus normal size         Assessment & Plan:

## 2012-06-07 NOTE — Assessment & Plan Note (Signed)
Pt with normal menses bleeding now 27 days from last period, blood noted on exam, likley cause of blood in urine. No further work-up needed, she is in perimenopausal age for irregular cycles

## 2012-07-19 ENCOUNTER — Other Ambulatory Visit: Payer: Self-pay | Admitting: Family Medicine

## 2012-08-03 ENCOUNTER — Other Ambulatory Visit: Payer: Self-pay | Admitting: Family Medicine

## 2012-08-11 ENCOUNTER — Other Ambulatory Visit: Payer: Self-pay | Admitting: Family Medicine

## 2012-10-17 ENCOUNTER — Ambulatory Visit: Payer: BC Managed Care – PPO | Admitting: Family Medicine

## 2012-11-06 ENCOUNTER — Other Ambulatory Visit: Payer: Self-pay | Admitting: Family Medicine

## 2012-11-06 ENCOUNTER — Other Ambulatory Visit: Payer: Self-pay

## 2012-11-06 MED ORDER — ZOLPIDEM TARTRATE 10 MG PO TABS: ORAL_TABLET | ORAL | Status: DC

## 2012-11-27 ENCOUNTER — Other Ambulatory Visit: Payer: Self-pay | Admitting: Family Medicine

## 2012-11-27 NOTE — Telephone Encounter (Signed)
Please refuse the med.  She needs an appt.

## 2012-11-27 NOTE — Telephone Encounter (Signed)
Dr. Simpson pt? °

## 2012-11-27 NOTE — Telephone Encounter (Signed)
?  ok to refill °

## 2012-11-27 NOTE — Telephone Encounter (Signed)
Med refused.

## 2013-01-09 ENCOUNTER — Telehealth: Payer: Self-pay | Admitting: Family Medicine

## 2013-01-09 DIAGNOSIS — I10 Essential (primary) hypertension: Secondary | ICD-10-CM

## 2013-01-09 DIAGNOSIS — E785 Hyperlipidemia, unspecified: Secondary | ICD-10-CM

## 2013-01-10 ENCOUNTER — Other Ambulatory Visit: Payer: Self-pay

## 2013-01-10 ENCOUNTER — Telehealth: Payer: Self-pay | Admitting: Family Medicine

## 2013-01-10 DIAGNOSIS — E785 Hyperlipidemia, unspecified: Secondary | ICD-10-CM

## 2013-01-10 MED ORDER — TRIAMTERENE-HCTZ 37.5-25 MG PO TABS: 1.0000 | ORAL_TABLET | Freq: Every day | ORAL | Status: DC

## 2013-01-10 MED ORDER — FLUTICASONE PROPIONATE 50 MCG/ACT NA SUSP: 2.0000 | Freq: Every day | NASAL | Status: DC

## 2013-01-10 MED ORDER — ZOLPIDEM TARTRATE 10 MG PO TABS: ORAL_TABLET | ORAL | Status: DC

## 2013-01-10 MED ORDER — ALPRAZOLAM 0.25 MG PO TABS: ORAL_TABLET | ORAL | Status: DC

## 2013-01-10 MED ORDER — TEMAZEPAM 30 MG PO CAPS: 30.0000 mg | ORAL_CAPSULE | Freq: Every evening | ORAL | Status: DC | PRN

## 2013-01-10 MED ORDER — PRAVASTATIN SODIUM 40 MG PO TABS: 40.0000 mg | ORAL_TABLET | Freq: Every evening | ORAL | Status: DC

## 2013-01-10 NOTE — Telephone Encounter (Signed)
meds refilled once until patient has appt

## 2013-01-10 NOTE — Telephone Encounter (Signed)
She does not take the temazepam so I took it off her medlist and faxed in the other 2 requested

## 2013-01-21 ENCOUNTER — Ambulatory Visit (INDEPENDENT_AMBULATORY_CARE_PROVIDER_SITE_OTHER): Payer: Medicaid Other | Admitting: Family Medicine

## 2013-01-21 ENCOUNTER — Encounter: Payer: Self-pay | Admitting: Family Medicine

## 2013-01-21 VITALS — BP 120/80 | HR 78 | Resp 16 | Wt 165.0 lb

## 2013-01-21 DIAGNOSIS — E785 Hyperlipidemia, unspecified: Secondary | ICD-10-CM

## 2013-01-21 DIAGNOSIS — M79601 Pain in right arm: Secondary | ICD-10-CM | POA: Insufficient documentation

## 2013-01-21 DIAGNOSIS — I10 Essential (primary) hypertension: Secondary | ICD-10-CM

## 2013-01-21 DIAGNOSIS — E559 Vitamin D deficiency, unspecified: Secondary | ICD-10-CM

## 2013-01-21 DIAGNOSIS — G4700 Insomnia, unspecified: Secondary | ICD-10-CM

## 2013-01-21 DIAGNOSIS — R5383 Other fatigue: Secondary | ICD-10-CM

## 2013-01-21 DIAGNOSIS — M79609 Pain in unspecified limb: Secondary | ICD-10-CM

## 2013-01-21 DIAGNOSIS — J309 Allergic rhinitis, unspecified: Secondary | ICD-10-CM

## 2013-01-21 MED ORDER — TEMAZEPAM 30 MG PO CAPS: 30.0000 mg | ORAL_CAPSULE | Freq: Every evening | ORAL | Status: DC | PRN

## 2013-01-21 MED ORDER — IBUPROFEN 800 MG PO TABS: 800.0000 mg | ORAL_TABLET | Freq: Three times a day (TID) | ORAL | Status: DC | PRN

## 2013-01-21 MED ORDER — CALCIUM CARBONATE-VITAMIN D 500-200 MG-UNIT PO TABS: 1.0000 | ORAL_TABLET | Freq: Two times a day (BID) | ORAL | Status: DC

## 2013-01-21 NOTE — Progress Notes (Signed)
  Subjective:    Patient ID: Lori Sims, female    DOB: 11-07-1960, 52 y.o.   MRN: [id number]  HPI  The PT is here for follow up and re-evaluation of chronic medical conditions, medication management and review of any available recent lab and radiology data.  Preventive health is updated, specifically  Cancer screening and Immunization.   Had gyne exam approx 2 to 3 months ago, has cyst on ovary which is being followed and also had a biopsy and a rectal exam, has 6 month f/u visit Started on progesterone to attempt to shrink cyst on ovary by gyne. The PT denies any adverse reactions to current medications since the last visit. States zolpidem is not as effective as restoril and wants this back Lost a close friend last month and still grieving, denies depression symptoms. Unemployed since May, started back in the last 2 weeks       Review of Systems See HPI Denies recent fever or chills. Denies sinus pressure, nasal congestion, ear pain or sore throat. Denies chest congestion, productive cough or wheezing. Denies chest pains, palpitations and leg swelling Denies abdominal pain, nausea, vomiting,diarrhea or constipation.   Denies dysuria, frequency, hesitancy or incontinence.  Denies headaches, seizures, numbness, or tingling. Denies depression or  anxiety  Denies skin break down or rash.        Objective:   Physical Exam  Patient alert and oriented and in no cardiopulmonary distress.  HEENT: No facial asymmetry, EOMI, no sinus tenderness,  oropharynx pink and moist.  Neck supple no adenopathy.  Chest: Clear to auscultation bilaterally.  CVS: S1, S2 no murmurs, no S3.  ABD: Soft non tender. Bowel sounds normal.  Ext: No edema  MS: Adequate ROM spine, hips and knees.Limitation in right upper extremity mobility due to pain, also spasm of righ shoulder muscles  Skin: Intact, no ulcerations or rash noted.  Psych: Good eye contact, normal affect. Memory intact not  anxious or depressed appearing.  CNS: CN 2-12 intact, power, tone and sensation normal throughout.       Assessment & Plan:

## 2013-01-21 NOTE — Assessment & Plan Note (Addendum)
Chronic , unchanged, updated lab needed  Improved sleep will help also regular exercise

## 2013-01-21 NOTE — Patient Instructions (Addendum)
F/u in 4 month, call if you need me before.  Flu vaccine will be available in October, call to get one  Fasting lipid, cmp , cbc, TSH and vit D this week pls, results will be sent to you on "my chart"  It is important that you exercise regularly at least 30 minutes 5 times a week. If you develop chest pain, have severe difficulty breathing, or feel very tired, stop exercising immediately and seek medical attention   I am sorry about your recent loss.  Please schedule your mammogram  New medication restoril for sleep, Remus Loffler is discontinued  No other med changes at thsi time.  For arm pian, an xray is ordered od right arm and shoulder. Ibuprofen prescribed 3 times daily for 10 days and you are referred to physical therapy. I believe pain is due to tendinitis, muscle related since worse with movement. Call bak in 4 weeks if not resolved please

## 2013-01-21 NOTE — Assessment & Plan Note (Signed)
Controlled, no change in medication  

## 2013-01-21 NOTE — Assessment & Plan Note (Signed)
1 month history, new, anti inflammatories and Pt, if no success, then will need further eval/ortho

## 2013-01-21 NOTE — Assessment & Plan Note (Signed)
Controlled, no change in medication DASH diet and commitment to daily physical activity for a minimum of 30 minutes discussed and encouraged, as a part of hypertension management. The importance of attaining a healthy weight is also discussed.  

## 2013-01-31 ENCOUNTER — Other Ambulatory Visit: Payer: Self-pay | Admitting: Family Medicine

## 2013-01-31 DIAGNOSIS — G4700 Insomnia, unspecified: Secondary | ICD-10-CM

## 2013-01-31 DIAGNOSIS — Z139 Encounter for screening, unspecified: Secondary | ICD-10-CM

## 2013-01-31 MED ORDER — TRIAZOLAM 0.25 MG PO TABS: 0.2500 mg | ORAL_TABLET | Freq: Every evening | ORAL | Status: DC | PRN

## 2013-02-01 ENCOUNTER — Telehealth (HOSPITAL_COMMUNITY): Payer: Self-pay

## 2013-02-01 ENCOUNTER — Ambulatory Visit (HOSPITAL_COMMUNITY)
Admission: RE | Admit: 2013-02-01 | Discharge: 2013-02-01 | Disposition: A | Discharge status: Home / Self Care | Payer: Medicaid Other | Source: Ambulatory Visit | Attending: Family Medicine | Admitting: Family Medicine

## 2013-02-01 DIAGNOSIS — M79609 Pain in unspecified limb: Secondary | ICD-10-CM | POA: Insufficient documentation

## 2013-02-01 DIAGNOSIS — M25519 Pain in unspecified shoulder: Secondary | ICD-10-CM | POA: Insufficient documentation

## 2013-02-01 DIAGNOSIS — M79601 Pain in right arm: Secondary | ICD-10-CM

## 2013-02-01 LAB — TSH: TSH: 0.838 u[IU]/mL (ref 0.350–4.500)

## 2013-02-01 LAB — COMPREHENSIVE METABOLIC PANEL
Albumin: 4.5 g/dL (ref 3.5–5.2)
CO2: 27 mEq/L (ref 19–32)
Calcium: 9.6 mg/dL (ref 8.4–10.5)
Glucose, Bld: 73 mg/dL (ref 70–99)
Potassium: 3.7 mEq/L (ref 3.5–5.3)
Sodium: 137 mEq/L (ref 135–145)
Total Protein: 7.5 g/dL (ref 6.0–8.3)

## 2013-02-01 LAB — CBC WITH DIFFERENTIAL/PLATELET
Eosinophils Absolute: 0.2 10*3/uL (ref 0.0–0.7)
Lymphocytes Relative: 32 % (ref 12–46)
Lymphs Abs: 3.1 10*3/uL (ref 0.7–4.0)
Neutrophils Relative %: 61 % (ref 43–77)
Platelets: 351 10*3/uL (ref 150–400)
RBC: 3.94 MIL/uL (ref 3.87–5.11)
WBC: 9.8 10*3/uL (ref 4.0–10.5)

## 2013-02-01 LAB — LIPID PANEL: HDL: 48 mg/dL (ref >39)

## 2013-02-04 ENCOUNTER — Ambulatory Visit (HOSPITAL_COMMUNITY)
Admission: RE | Admit: 2013-02-04 | Discharge: 2013-02-04 | Disposition: A | Discharge status: Home / Self Care | Payer: Medicaid Other | Source: Ambulatory Visit | Attending: Family Medicine | Admitting: Family Medicine

## 2013-02-04 DIAGNOSIS — Z139 Encounter for screening, unspecified: Secondary | ICD-10-CM

## 2013-02-04 DIAGNOSIS — Z1231 Encounter for screening mammogram for malignant neoplasm of breast: Secondary | ICD-10-CM | POA: Insufficient documentation

## 2013-03-28 ENCOUNTER — Other Ambulatory Visit: Payer: Self-pay | Admitting: Family Medicine

## 2013-04-18 ENCOUNTER — Other Ambulatory Visit: Payer: Self-pay

## 2013-04-30 ENCOUNTER — Other Ambulatory Visit: Payer: Self-pay | Admitting: Family Medicine

## 2013-05-27 ENCOUNTER — Ambulatory Visit: Payer: Medicaid Other | Admitting: Family Medicine

## 2013-07-01 ENCOUNTER — Encounter: Payer: Self-pay | Admitting: Family Medicine

## 2013-07-01 ENCOUNTER — Ambulatory Visit (INDEPENDENT_AMBULATORY_CARE_PROVIDER_SITE_OTHER): Payer: 59 | Admitting: Family Medicine

## 2013-07-01 VITALS — BP 112/68 | HR 76 | Resp 18 | Ht 67.0 in | Wt 172.0 lb

## 2013-07-01 DIAGNOSIS — I10 Essential (primary) hypertension: Secondary | ICD-10-CM

## 2013-07-01 DIAGNOSIS — M79609 Pain in unspecified limb: Secondary | ICD-10-CM

## 2013-07-01 DIAGNOSIS — M79601 Pain in right arm: Secondary | ICD-10-CM

## 2013-07-01 DIAGNOSIS — R5381 Other malaise: Secondary | ICD-10-CM

## 2013-07-01 DIAGNOSIS — E8881 Metabolic syndrome: Secondary | ICD-10-CM

## 2013-07-01 DIAGNOSIS — E538 Deficiency of other specified B group vitamins: Secondary | ICD-10-CM

## 2013-07-01 DIAGNOSIS — R5383 Other fatigue: Secondary | ICD-10-CM

## 2013-07-01 DIAGNOSIS — E785 Hyperlipidemia, unspecified: Secondary | ICD-10-CM

## 2013-07-01 DIAGNOSIS — E663 Overweight: Secondary | ICD-10-CM | POA: Insufficient documentation

## 2013-07-01 DIAGNOSIS — E049 Nontoxic goiter, unspecified: Secondary | ICD-10-CM

## 2013-07-01 DIAGNOSIS — J309 Allergic rhinitis, unspecified: Secondary | ICD-10-CM

## 2013-07-01 DIAGNOSIS — G4700 Insomnia, unspecified: Secondary | ICD-10-CM

## 2013-07-01 MED ORDER — FLUTICASONE PROPIONATE 50 MCG/ACT NA SUSP: 2.0000 | Freq: Every day | NASAL | Status: DC

## 2013-07-01 MED ORDER — PRAVASTATIN SODIUM 40 MG PO TABS
40.0000 mg | ORAL_TABLET | Freq: Every evening | ORAL | Status: DC
Start: 2013-07-01 — End: 2013-11-18

## 2013-07-01 MED ORDER — TRIAMTERENE-HCTZ 37.5-25 MG PO TABS: ORAL_TABLET | ORAL | Status: DC

## 2013-07-01 NOTE — Assessment & Plan Note (Signed)
Repeat imaging study needed, noe in 5 years will schedule pt to be informed of  the need for this also

## 2013-07-01 NOTE — Assessment & Plan Note (Signed)
The increased risk of cardiovascular disease associated with this diagnosis, and the need to consistently work on lifestyle to change this is discussed. Following  a  heart healthy diet ,commitment to 30 minutes of exercise at least 5 days per week, as well as control of blood sugar and cholesterol , and achieving a healthy weight are all the areas to be addressed .  

## 2013-07-01 NOTE — Assessment & Plan Note (Signed)
Controlled, no change in medication DASH diet and commitment to daily physical activity for a minimum of 30 minutes discussed and encouraged, as a part of hypertension management. The importance of attaining a healthy weight is also discussed.  

## 2013-07-01 NOTE — Progress Notes (Signed)
   Subjective:    Patient ID: Lori Sims, female    DOB: 08/21/1960, 53 y.o.   MRN: [id number]  HPI The PT is here for follow up and re-evaluation of chronic medical conditions, medication management and review of any available recent lab and radiology data.  Preventive health is updated, specifically  Cancer screening and Immunization.    The PT denies any adverse reactions to current medications since the last visit.  There are no new concerns.  There are no specific complaints , cutting back on xanax wants to quit, and did not use halcion regulalrly, trying to manage insomnia without medication and is successful to a great extent      Review of Systems See HPI Denies recent fever or chills. Denies sinus pressure, nasal congestion, ear pain or sore throat. Denies chest congestion, productive cough or wheezing. Denies chest pains, palpitations and leg swelling Denies abdominal pain, nausea, vomiting,diarrhea or constipation.   Denies dysuria, frequency, hesitancy or incontinence. Denies joint pain, swelling and limitation in mobility. Denies headaches, seizures, numbness, or tingling. Denies depression, anxiety or insomnia. Denies skin break down or rash.        Objective:   Physical Exam Patient alert and oriented and in no cardiopulmonary distress.  HEENT: No facial asymmetry, EOMI, no sinus tenderness,  oropharynx pink and moist.  Neck supple no adenopathy.goiter, no JVD  Chest: Clear to auscultation bilaterally.  CVS: S1, S2 no murmurs, no S3.  ABD: Soft non tender. Bowel sounds normal.  Ext: No edema  MS: Adequate ROM spine, shoulders, hips and knees.  Skin: Intact, no ulcerations or rash noted.  Psych: Good eye contact, normal affect. Memory intact not anxious or depressed appearing.  CNS: CN 2-12 intact, power, tone and sensation normal throughout.        Assessment & Plan:

## 2013-07-01 NOTE — Assessment & Plan Note (Signed)
intermittent right arm and shoulder pain with limitation in mobility, unchanged, uses ibuprofen intermittently, no interest in ortho eval at this time

## 2013-07-01 NOTE — Assessment & Plan Note (Signed)
Improved, pt weaning off of medication , sleep hygiene reviewed

## 2013-07-01 NOTE — Assessment & Plan Note (Signed)
Deteriorated. Patient re-educated about  the importance of commitment to a  minimum of 150 minutes of exercise per week. The importance of healthy food choices with portion control discussed. Encouraged to start a food diary, count calories and to consider  joining a support group. Sample diet sheets offered. Goals set by the patient for the next several months.    

## 2013-07-01 NOTE — Assessment & Plan Note (Signed)
Mild symptoms 3 weeks ago and anticipated increase in the next several months, flonase to be started daily

## 2013-07-01 NOTE — Patient Instructions (Signed)
F/u in August, call if you need me before  Fasting lipid, cmp and HBa1C mid Februaury It is important that you exercise regularly at least 30 minutes 5 times a week. If you develop chest pain, have severe difficulty breathing, or feel very tired, stop exercising immediately and seek medical attention   A healthy diet is rich in fruit, vegetables and whole grains. Poultry fish, nuts and beans are a healthy choice for protein rather then red meat. A low sodium diet and drinking 64 ounces of water daily is generally recommended. Oils and sweet should be limited. Carbohydrates especially for those who are diabetic or overweight, should be limited to 60-45 gram per meal. It is important to eat on a regular schedule, at least 3 times daily. Snacks should be primarily fruits, vegetables or nuts.   CBC, fasting lipid and cmp and TSH in august  Medications will be filled for next 7 months  Pls reconsider the flu vaccine recommended strongly

## 2013-07-05 ENCOUNTER — Ambulatory Visit (HOSPITAL_COMMUNITY)
Admission: RE | Admit: 2013-07-05 | Discharge: 2013-07-05 | Disposition: A | Discharge status: Home / Self Care | Payer: 59 | Source: Ambulatory Visit | Attending: Family Medicine | Admitting: Family Medicine

## 2013-07-05 ENCOUNTER — Ambulatory Visit (HOSPITAL_COMMUNITY): Payer: 59

## 2013-07-05 DIAGNOSIS — E041 Nontoxic single thyroid nodule: Secondary | ICD-10-CM | POA: Insufficient documentation

## 2013-07-05 DIAGNOSIS — Z09 Encounter for follow-up examination after completed treatment for conditions other than malignant neoplasm: Secondary | ICD-10-CM | POA: Insufficient documentation

## 2013-07-05 DIAGNOSIS — E049 Nontoxic goiter, unspecified: Secondary | ICD-10-CM

## 2013-07-05 DIAGNOSIS — E042 Nontoxic multinodular goiter: Secondary | ICD-10-CM | POA: Insufficient documentation

## 2013-07-07 ENCOUNTER — Other Ambulatory Visit: Payer: Self-pay | Admitting: Family Medicine

## 2013-07-07 DIAGNOSIS — E042 Nontoxic multinodular goiter: Secondary | ICD-10-CM

## 2013-11-15 LAB — HEMOGLOBIN A1C
HEMOGLOBIN A1C: 5.5 % (ref <5.7)
Mean Plasma Glucose: 111 mg/dL (ref <117)

## 2013-11-16 LAB — COMPREHENSIVE METABOLIC PANEL
ALT: 10 U/L (ref 0–35)
AST: 14 U/L (ref 0–37)
Albumin: 4.1 g/dL (ref 3.5–5.2)
Alkaline Phosphatase: 53 U/L (ref 39–117)
BUN: 16 mg/dL (ref 6–23)
CO2: 30 meq/L (ref 19–32)
CREATININE: 0.96 mg/dL (ref 0.50–1.10)
Calcium: 9.1 mg/dL (ref 8.4–10.5)
Chloride: 103 mEq/L (ref 96–112)
GLUCOSE: 73 mg/dL (ref 70–99)
POTASSIUM: 3.8 meq/L (ref 3.5–5.3)
Sodium: 141 mEq/L (ref 135–145)
Total Bilirubin: 0.4 mg/dL (ref 0.2–1.2)
Total Protein: 7 g/dL (ref 6.0–8.3)

## 2013-11-16 LAB — LIPID PANEL
CHOL/HDL RATIO: 4.1 ratio
CHOLESTEROL: 216 mg/dL — AB (ref 0–200)
HDL: 53 mg/dL (ref >39)
LDL Cholesterol: 151 mg/dL — ABNORMAL HIGH (ref 0–99)
TRIGLYCERIDES: 58 mg/dL (ref <150)
VLDL: 12 mg/dL (ref 0–40)

## 2013-11-18 ENCOUNTER — Other Ambulatory Visit: Payer: Self-pay

## 2013-11-18 DIAGNOSIS — E785 Hyperlipidemia, unspecified: Secondary | ICD-10-CM

## 2013-11-18 MED ORDER — PRAVASTATIN SODIUM 40 MG PO TABS: 40.0000 mg | ORAL_TABLET | Freq: Every evening | ORAL | Status: DC

## 2013-12-11 ENCOUNTER — Other Ambulatory Visit: Payer: Self-pay | Admitting: Family Medicine

## 2014-01-06 ENCOUNTER — Other Ambulatory Visit (HOSPITAL_COMMUNITY): Payer: Self-pay | Admitting: "Endocrinology

## 2014-01-06 DIAGNOSIS — E042 Nontoxic multinodular goiter: Secondary | ICD-10-CM

## 2014-01-13 ENCOUNTER — Other Ambulatory Visit: Payer: Self-pay | Admitting: Family Medicine

## 2014-01-13 DIAGNOSIS — Z1231 Encounter for screening mammogram for malignant neoplasm of breast: Secondary | ICD-10-CM

## 2014-01-15 ENCOUNTER — Encounter: Payer: Self-pay | Admitting: Family Medicine

## 2014-01-15 ENCOUNTER — Ambulatory Visit (INDEPENDENT_AMBULATORY_CARE_PROVIDER_SITE_OTHER): Payer: 59 | Admitting: Family Medicine

## 2014-01-15 VITALS — BP 120/74 | HR 78 | Resp 16 | Ht 67.0 in | Wt 176.8 lb

## 2014-01-15 DIAGNOSIS — R5381 Other malaise: Secondary | ICD-10-CM

## 2014-01-15 DIAGNOSIS — R5383 Other fatigue: Secondary | ICD-10-CM

## 2014-01-15 DIAGNOSIS — I10 Essential (primary) hypertension: Secondary | ICD-10-CM

## 2014-01-15 DIAGNOSIS — J309 Allergic rhinitis, unspecified: Secondary | ICD-10-CM

## 2014-01-15 DIAGNOSIS — E8881 Metabolic syndrome: Secondary | ICD-10-CM

## 2014-01-15 DIAGNOSIS — E785 Hyperlipidemia, unspecified: Secondary | ICD-10-CM

## 2014-01-15 DIAGNOSIS — E663 Overweight: Secondary | ICD-10-CM

## 2014-01-15 MED ORDER — TRIAMTERENE-HCTZ 37.5-25 MG PO TABS: ORAL_TABLET | ORAL | Status: DC

## 2014-01-15 MED ORDER — PRAVASTATIN SODIUM 80 MG PO TABS: 80.0000 mg | ORAL_TABLET | Freq: Every day | ORAL | Status: DC

## 2014-01-15 NOTE — Assessment & Plan Note (Signed)
Controlled, no change in medication DASH diet and commitment to daily physical activity for a minimum of 30 minutes discussed and encouraged, as a part of hypertension management. The importance of attaining a healthy weight is also discussed.  

## 2014-01-15 NOTE — Assessment & Plan Note (Signed)
Uncontrolled Hyperlipidemia:Low fat diet discussed and encouraged.   Dose increase in pravstatin

## 2014-01-15 NOTE — Assessment & Plan Note (Signed)
Controlled, no change in medication  

## 2014-01-15 NOTE — Patient Instructions (Signed)
F/u in 6 month, call if you need me before  Pls reconsider flu vaccine, call if you decide to get this  You will get 3 stool cards which need to be returned in the next 2 weeks for screening for colon cancer  Pls reduce fat in diet, dose increase in pravachol, you may take two 40mg  tabs daily till done BP is excellent  Happy that you feel well, and have been more regular with exercise  Weight loss goal of 5 pounds  CBC, fasting lipid, cmp , TSH in 6 month

## 2014-01-15 NOTE — Assessment & Plan Note (Signed)
Deteriorated. Patient re-educated about  the importance of commitment to a  minimum of 150 minutes of exercise per week. The importance of healthy food choices with portion control discussed. Encouraged to start a food diary, count calories and to consider  joining a support group. Sample diet sheets offered. Goals set by the patient for the next several months.    

## 2014-01-15 NOTE — Assessment & Plan Note (Signed)
The increased risk of cardiovascular disease associated with this diagnosis, and the need to consistently work on lifestyle to change this is discussed. Following  a  heart healthy diet ,commitment to 30 minutes of exercise at least 5 days per week, as well as control of blood sugar and cholesterol , and achieving a healthy weight are all the areas to be addressed .  

## 2014-01-15 NOTE — Progress Notes (Signed)
   Subjective:    Patient ID: Lori Sims, female    DOB: 12/19/1960, 52 y.o.   MRN: [id number]  HPI The PT is here for follow up and re-evaluation of chronic medical conditions, medication management and review of any available recent lab and radiology data.  Preventive health is updated, specifically  Cancer screening and Immunization.  No interest in flu vaccine, re  Education done. Refuses rectal, opting for stool cards  The PT denies any adverse reactions to current medications since the last visit.  There are no new concerns.  There are no specific complaints       Review of Systems See HPI Denies recent fever or chills. Denies sinus pressure, nasal congestion, ear pain or sore throat. Denies chest congestion, productive cough or wheezing. Denies chest pains, palpitations and leg swelling Denies abdominal pain, nausea, vomiting,diarrhea or constipation.   Denies dysuria, frequency, hesitancy or incontinence. Denies joint pain, swelling and limitation in mobility. Denies headaches, seizures, numbness, or tingling. Denies depression, anxiety or insomnia.Dealing with insomnia naturally with improvement Denies skin break down or rash.        Objective:   Physical Exam  BP 120/74  Pulse 78  Resp 16  Ht 5\' 7"  (1.702 m)  Wt 176 lb 12.8 oz (80.196 kg)  BMI 27.68 kg/m2  SpO2 97% Patient alert and oriented and in no cardiopulmonary distress.  HEENT: No facial asymmetry, EOMI,   oropharynx pink and moist.  Neck supple no JVD, no mass.  Chest: Clear to auscultation bilaterally.  CVS: S1, S2 no murmurs, no S3.Regular rate.  Abdomen ; soft.   Ext: No edema  MS: Adequate ROM spine, shoulders, hips and knees.  Skin: Intact, no ulcerations or rash noted.  Psych: Good eye contact, normal affect. Memory intact not anxious or depressed appearing.  CNS: CN 2-12 intact, power,  normal throughout.no focal deficits noted.       Assessment & Plan:   HYPERTENSION Controlled, no change in medication  DASH diet and commitment to daily physical activity for a minimum of 30 minutes discussed and encouraged, as a part of hypertension management. The importance of attaining a healthy weight is also discussed.   Other and unspecified hyperlipidemia Uncontrolled Hyperlipidemia:Low fat diet discussed and encouraged.   Dose increase in pravstatin  ALLERGIC RHINITIS CAUSE UNSPECIFIED Controlled, no change in medication   Overweight (BMI 25.0-29.9) Deteriorated. Patient re-educated about  the importance of commitment to a  minimum of 150 minutes of exercise per week. The importance of healthy food choices with portion control discussed. Encouraged to start a food diary, count calories and to consider  joining a support group. Sample diet sheets offered. Goals set by the patient for the next several months.     Metabolic syndrome X The increased risk of cardiovascular disease associated with this diagnosis, and the need to consistently work on lifestyle to change this is discussed. Following  a  heart healthy diet ,commitment to 30 minutes of exercise at least 5 days per week, as well as control of blood sugar and cholesterol , and achieving a healthy weight are all the areas to be addressed .

## 2014-02-03 ENCOUNTER — Ambulatory Visit (HOSPITAL_COMMUNITY): Payer: 59

## 2014-02-14 ENCOUNTER — Ambulatory Visit (HOSPITAL_COMMUNITY)
Admission: RE | Admit: 2014-02-14 | Discharge: 2014-02-14 | Disposition: A | Discharge status: Home / Self Care | Payer: 59 | Source: Ambulatory Visit | Attending: "Endocrinology | Admitting: "Endocrinology

## 2014-02-14 DIAGNOSIS — E042 Nontoxic multinodular goiter: Secondary | ICD-10-CM | POA: Diagnosis present

## 2014-02-24 ENCOUNTER — Ambulatory Visit (HOSPITAL_COMMUNITY)
Admission: RE | Admit: 2014-02-24 | Discharge: 2014-02-24 | Disposition: A | Discharge status: Home / Self Care | Payer: 59 | Source: Ambulatory Visit | Attending: Family Medicine | Admitting: Family Medicine

## 2014-02-24 DIAGNOSIS — Z1231 Encounter for screening mammogram for malignant neoplasm of breast: Secondary | ICD-10-CM | POA: Diagnosis present

## 2014-06-11 ENCOUNTER — Telehealth: Payer: Self-pay | Admitting: *Deleted

## 2014-06-11 NOTE — Telephone Encounter (Signed)
Pt called requesting something to help her sleep. Please advise [phone]

## 2014-06-12 ENCOUNTER — Other Ambulatory Visit: Payer: Self-pay | Admitting: Family Medicine

## 2014-06-12 MED ORDER — TEMAZEPAM 30 MG PO CAPS: 30.0000 mg | ORAL_CAPSULE | Freq: Every evening | ORAL | Status: DC | PRN

## 2014-06-12 NOTE — Telephone Encounter (Signed)
Med sent and patient left a message on phone

## 2014-06-12 NOTE — Telephone Encounter (Signed)
Med sent.

## 2014-06-12 NOTE — Telephone Encounter (Signed)
Will refill x 2 months what she used in the past, pls let her know

## 2014-06-12 NOTE — Telephone Encounter (Signed)
States she has been having trouble sleeping again since last week. Was on sleeping med before but wanted to wean herself off but now needs something again. Can't stay asleep. Uses [facility] [place]. Please advise

## 2014-07-17 ENCOUNTER — Encounter: Payer: Self-pay | Admitting: Gastroenterology

## 2014-07-17 ENCOUNTER — Ambulatory Visit (INDEPENDENT_AMBULATORY_CARE_PROVIDER_SITE_OTHER): Payer: BLUE CROSS/BLUE SHIELD | Admitting: Family Medicine

## 2014-07-17 ENCOUNTER — Encounter: Payer: Self-pay | Admitting: Family Medicine

## 2014-07-17 VITALS — BP 122/80 | HR 64 | Resp 16 | Ht 67.0 in | Wt 184.0 lb

## 2014-07-17 DIAGNOSIS — R32 Unspecified urinary incontinence: Secondary | ICD-10-CM | POA: Insufficient documentation

## 2014-07-17 DIAGNOSIS — I10 Essential (primary) hypertension: Secondary | ICD-10-CM

## 2014-07-17 DIAGNOSIS — E559 Vitamin D deficiency, unspecified: Secondary | ICD-10-CM

## 2014-07-17 DIAGNOSIS — E785 Hyperlipidemia, unspecified: Secondary | ICD-10-CM

## 2014-07-17 DIAGNOSIS — M25521 Pain in right elbow: Secondary | ICD-10-CM | POA: Insufficient documentation

## 2014-07-17 DIAGNOSIS — N3946 Mixed incontinence: Secondary | ICD-10-CM

## 2014-07-17 DIAGNOSIS — K219 Gastro-esophageal reflux disease without esophagitis: Secondary | ICD-10-CM

## 2014-07-17 DIAGNOSIS — R1310 Dysphagia, unspecified: Secondary | ICD-10-CM

## 2014-07-17 DIAGNOSIS — N951 Menopausal and female climacteric states: Secondary | ICD-10-CM

## 2014-07-17 MED ORDER — TEMAZEPAM 30 MG PO CAPS: 30.0000 mg | ORAL_CAPSULE | Freq: Every evening | ORAL | Status: DC | PRN

## 2014-07-17 MED ORDER — FLUTICASONE PROPIONATE 50 MCG/ACT NA SUSP: 2.0000 | Freq: Every day | NASAL | Status: DC

## 2014-07-17 MED ORDER — ESOMEPRAZOLE MAGNESIUM 40 MG PO PACK: 40.0000 mg | PACK | Freq: Every day | ORAL | Status: DC

## 2014-07-17 MED ORDER — PRAVASTATIN SODIUM 80 MG PO TABS: 80.0000 mg | ORAL_TABLET | Freq: Every day | ORAL | Status: DC

## 2014-07-17 MED ORDER — SOLIFENACIN SUCCINATE 5 MG PO TABS: ORAL_TABLET | ORAL | Status: DC

## 2014-07-17 MED ORDER — TRAMADOL HCL 50 MG PO TABS: ORAL_TABLET | ORAL | Status: DC

## 2014-07-17 MED ORDER — TRIAMTERENE-HCTZ 37.5-25 MG PO TABS: ORAL_TABLET | ORAL | Status: DC

## 2014-07-17 MED ORDER — VENLAFAXINE HCL ER 37.5 MG PO CP24: 37.5000 mg | ORAL_CAPSULE | Freq: Every day | ORAL | Status: DC

## 2014-07-17 NOTE — Assessment & Plan Note (Signed)
New symptoms of reflux with dysphagia, which have started in the past 2 to 3 months Start nexium , redduce/ discontinue caffeine , refer to GI

## 2014-07-17 NOTE — Assessment & Plan Note (Signed)
Controlled, no change in medication DASH diet and commitment to daily physical activity for a minimum of 30 minutes discussed and encouraged, as a part of hypertension management. The importance of attaining a healthy weight is also discussed.  

## 2014-07-17 NOTE — Assessment & Plan Note (Signed)
New onset and disabling Start effexor, lifestylwe changes to lessen symptoms

## 2014-07-17 NOTE — Assessment & Plan Note (Signed)
Progressively worsening, reviewed behavioral techniques, trial of vesicare

## 2014-07-17 NOTE — Progress Notes (Signed)
   Subjective:    Patient ID: Lori Sims, female    DOB: 11-16-1960, 54 y.o.   MRN: [id number]  HPI The PT is here for follow up and re-evaluation of chronic medical conditions, medication management and review of any available recent lab and radiology data.  Preventive health is updated, specifically  Cancer screening and Immunization.    The PT denies any adverse reactions to current medications since the last visit.  Right elbow pain x 4 month  Food sticking when she swallows with reflux symptoms x 2 months Urinary inccontinenece worsening x  3 months Hot flashes x 1 week       Review of Systems See HPI Denies recent fever or chills.c/o fatigue and poor sleep Denies sinus pressure, nasal congestion, ear pain or sore throat. Denies chest congestion, productive cough or wheezing. Denies chest pains, palpitations and leg swelling Denies abdominal pain, nausea, vomiting,diarrhea or constipation.   Denies dysuria, frequency, hesitancy or incontinence. y. Denies headaches, seizures, numbness, or tingling. Denies depression, anxiety  Denies skin break down or rash.        Objective:   Physical Exam  BP 122/80 mmHg  Pulse 64  Resp 16  Ht 5\' 7"  (1.702 m)  Wt 184 lb (83.462 kg)  BMI 28.81 kg/m2  SpO2 100% Patient alert and oriented and in no cardiopulmonary distress.  HEENT: No facial asymmetry, EOMI,   oropharynx pink and moist.  Neck supple no JVD, no mass.  Chest: Clear to auscultation bilaterally.  CVS: S1, S2 no murmurs, no S3.Regular rate.  ABD: Soft no localized tenderness or  guarding   Ext: No edema  MS: Adequate ROM spine, shoulders, hips and knees.  Skin: Intact, no ulcerations or rash noted.  Psych: Good eye contact, normal affect. Memory intact not anxious or depressed appearing.  CNS: CN 2-12 intact, power,  normal throughout.no focal deficits noted.       Assessment & Plan:  Essential hypertension Controlled, no change in  medication DASH diet and commitment to daily physical activity for a minimum of 30 minutes discussed and encouraged, as a part of hypertension management. The importance of attaining a healthy weight is also discussed.    GERD (gastroesophageal reflux disease) New symptoms of reflux with dysphagia, which have started in the past 2 to 3 months Start nexium , redduce/ discontinue caffeine , refer to GI    Hot flashes, menopausal New onset and disabling Start effexor, lifestylwe changes to lessen symptoms   Right elbow pain Progressively worsening , 4 month history, seen in urgent care , no relief, will refer to ortho for definitive management   Urinary incontinence Progressively worsening, reviewed behavioral techniques, trial of vesicare

## 2014-07-17 NOTE — Assessment & Plan Note (Signed)
Progressively worsening , 4 month history, seen in urgent care , no relief, will refer to ortho for definitive management

## 2014-07-17 NOTE — Patient Instructions (Addendum)
F/u in 4 month, call if you need me before  Fastign cbc, cmp , lipid, TSH , Vit D as soon as possible  New for hot flashes is effexor  New trial for incontinence is vesicare, use as needed  New for reflux is nexium and you are referred to gastroenterologist due to difficulty swallowing  You are referred to Dr Aline Brochure for elbow pain  New for pain at night in right elbow is tramadol x

## 2014-07-18 ENCOUNTER — Telehealth: Payer: Self-pay

## 2014-07-18 ENCOUNTER — Other Ambulatory Visit: Payer: Self-pay

## 2014-07-18 MED ORDER — PREDNISONE 5 MG PO TABS: 5.0000 mg | ORAL_TABLET | Freq: Two times a day (BID) | ORAL | Status: AC

## 2014-07-18 MED ORDER — PREDNISONE 5 MG PO TABS: 5.0000 mg | ORAL_TABLET | Freq: Two times a day (BID) | ORAL | Status: DC

## 2014-07-18 NOTE — Telephone Encounter (Signed)
Tramadol made her vomit multiple times, much worse than the prednisone. Wants the prednisone sent in instead to [facility]

## 2014-07-18 NOTE — Telephone Encounter (Signed)
Med sent, tramadol intolerance noted, pls let her know

## 2014-07-24 ENCOUNTER — Telehealth: Payer: Self-pay | Admitting: Family Medicine

## 2014-07-24 DIAGNOSIS — N3946 Mixed incontinence: Secondary | ICD-10-CM

## 2014-07-24 DIAGNOSIS — E785 Hyperlipidemia, unspecified: Secondary | ICD-10-CM

## 2014-07-24 MED ORDER — FLUTICASONE PROPIONATE 50 MCG/ACT NA SUSP
2.0000 | Freq: Every day | NASAL | Status: DC
Start: 2014-07-24 — End: 2015-11-05

## 2014-07-24 MED ORDER — SOLIFENACIN SUCCINATE 5 MG PO TABS: ORAL_TABLET | ORAL | Status: DC

## 2014-07-24 MED ORDER — TEMAZEPAM 30 MG PO CAPS: 30.0000 mg | ORAL_CAPSULE | Freq: Every evening | ORAL | Status: DC | PRN

## 2014-07-24 MED ORDER — TRIAMTERENE-HCTZ 37.5-25 MG PO TABS: ORAL_TABLET | ORAL | Status: DC

## 2014-07-24 MED ORDER — PRAVASTATIN SODIUM 80 MG PO TABS: 80.0000 mg | ORAL_TABLET | Freq: Every day | ORAL | Status: DC

## 2014-07-24 NOTE — Telephone Encounter (Signed)
Resent

## 2014-08-05 ENCOUNTER — Ambulatory Visit: Payer: BLUE CROSS/BLUE SHIELD | Admitting: Orthopedic Surgery

## 2014-08-12 ENCOUNTER — Ambulatory Visit: Payer: BLUE CROSS/BLUE SHIELD | Admitting: Gastroenterology

## 2014-08-14 ENCOUNTER — Ambulatory Visit (INDEPENDENT_AMBULATORY_CARE_PROVIDER_SITE_OTHER): Payer: BLUE CROSS/BLUE SHIELD | Admitting: Orthopedic Surgery

## 2014-08-14 ENCOUNTER — Encounter: Payer: Self-pay | Admitting: Orthopedic Surgery

## 2014-08-14 ENCOUNTER — Other Ambulatory Visit: Payer: Self-pay | Admitting: Family Medicine

## 2014-08-14 ENCOUNTER — Ambulatory Visit (INDEPENDENT_AMBULATORY_CARE_PROVIDER_SITE_OTHER): Payer: BLUE CROSS/BLUE SHIELD

## 2014-08-14 VITALS — BP 118/66 | Ht 67.0 in | Wt 184.0 lb

## 2014-08-14 DIAGNOSIS — M7711 Lateral epicondylitis, right elbow: Secondary | ICD-10-CM

## 2014-08-14 DIAGNOSIS — M25521 Pain in right elbow: Secondary | ICD-10-CM

## 2014-08-14 LAB — CBC WITH DIFFERENTIAL/PLATELET
BASOS ABS: 0 10*3/uL (ref 0.0–0.1)
Basophils Relative: 0 % (ref 0–1)
Eosinophils Absolute: 0.2 10*3/uL (ref 0.0–0.7)
Eosinophils Relative: 3 % (ref 0–5)
HCT: 31.3 % — ABNORMAL LOW (ref 36.0–46.0)
Hemoglobin: 10.7 g/dL — ABNORMAL LOW (ref 12.0–15.0)
LYMPHS PCT: 37 % (ref 12–46)
Lymphs Abs: 2.3 10*3/uL (ref 0.7–4.0)
MCH: 30.7 pg (ref 26.0–34.0)
MCHC: 34.2 g/dL (ref 30.0–36.0)
MCV: 89.9 fL (ref 78.0–100.0)
MPV: 10.1 fL (ref 8.6–12.4)
Monocytes Absolute: 0.3 10*3/uL (ref 0.1–1.0)
Monocytes Relative: 5 % (ref 3–12)
NEUTROS ABS: 3.4 10*3/uL (ref 1.7–7.7)
NEUTROS PCT: 55 % (ref 43–77)
PLATELETS: 268 10*3/uL (ref 150–400)
RBC: 3.48 MIL/uL — AB (ref 3.87–5.11)
RDW: 13.3 % (ref 11.5–15.5)
WBC: 6.1 10*3/uL (ref 4.0–10.5)

## 2014-08-14 LAB — LIPID PANEL
CHOLESTEROL: 160 mg/dL (ref 0–200)
HDL: 64 mg/dL (ref >=46)
LDL Cholesterol: 78 mg/dL (ref 0–99)
TRIGLYCERIDES: 92 mg/dL (ref <150)
Total CHOL/HDL Ratio: 2.5 Ratio
VLDL: 18 mg/dL (ref 0–40)

## 2014-08-14 LAB — TSH: TSH: 0.926 u[IU]/mL (ref 0.350–4.500)

## 2014-08-14 LAB — COMPREHENSIVE METABOLIC PANEL
ALT: 21 U/L (ref 0–35)
AST: 20 U/L (ref 0–37)
Albumin: 3.9 g/dL (ref 3.5–5.2)
Alkaline Phosphatase: 55 U/L (ref 39–117)
BUN: 14 mg/dL (ref 6–23)
CHLORIDE: 103 meq/L (ref 96–112)
CO2: 30 mEq/L (ref 19–32)
Calcium: 9.1 mg/dL (ref 8.4–10.5)
Creat: 1.07 mg/dL (ref 0.50–1.10)
GLUCOSE: 88 mg/dL (ref 70–99)
POTASSIUM: 3.3 meq/L — AB (ref 3.5–5.3)
Sodium: 143 mEq/L (ref 135–145)
Total Bilirubin: 0.3 mg/dL (ref 0.2–1.2)
Total Protein: 6.4 g/dL (ref 6.0–8.3)

## 2014-08-14 NOTE — Progress Notes (Signed)
Patient ID: Lori Sims, female   DOB: 07-Jul-1960, 54 y.o.   MRN: [id number]  Chief Complaint  Patient presents with  . Elbow Pain    Right elbow pain, no known injury    HPI Lori Sims is a 54 y.o. female.  Who presents for evaluation of her right elbow with several weeks of pain she is already had this for about 3-4 months she went to see the urgent care doctor as well as her primary care doctor and she was given a combination of various drugs including naproxen prednisone a tennis elbow brace recommendations for ice and she also took some Tylenol she still has discomfort. 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  And this has resulted in sharp aching pain which is become constant 8 out of 10 over the lateral portion of the elbow. HPI  Review of Systems System review seasonal allergy joint pain and back pain constipation no numbness or tingling  Past Medical History  Diagnosis Date  . Hypertension     No past surgical history on file.  No family history on file.  Social History History  Substance Use Topics  . Smoking status: Never Smoker   . Smokeless tobacco: Not on file  . Alcohol Use: Not on file    Allergies  Allergen Reactions  . Penicillins   . Tramadol Nausea And Vomiting    Current Outpatient Prescriptions  Medication Sig Dispense Refill  . esomeprazole (NEXIUM) 40 MG packet Take 40 mg by mouth daily before breakfast. 30 each 1  . fluticasone (FLONASE) 50 MCG/ACT nasal spray Place 2 sprays into both nostrils daily. 16 g 5  . pravastatin (PRAVACHOL) 80 MG tablet Take 1 tablet (80 mg total) by mouth daily. 30 tablet 5  . temazepam (RESTORIL) 30 MG capsule Take 1 capsule (30 mg total) by mouth at bedtime as needed for sleep. 30 capsule 3  . triamterene-hydrochlorothiazide (MAXZIDE-25) 37.5-25 MG per tablet TAKE ONE TABLET BY MOUTH ONCE DAILY 30 tablet 5  . calcium-vitamin D (OSCAL 500/200 D-3) 500-200  MG-UNIT per tablet Take 1 tablet by mouth 2 (two) times daily. 180 tablet 3  . cyanocobalamin 1000 MCG tablet Take 100 mcg by mouth daily.      . solifenacin (VESICARE) 5 MG tablet One tablet once daily,. As needed, for incontinenece (Patient not taking: Reported on 08/14/2014) 30 tablet 5  . venlafaxine XR (EFFEXOR-XR) 37.5 MG 24 hr capsule Take 1 capsule (37.5 mg total) by mouth daily with breakfast. (Patient not taking: Reported on 08/14/2014) 30 capsule 5   No current facility-administered medications for this visit.       Physical Exam Blood pressure 118/66, height 5\' 7"  (1.702 m), weight 184 lb (83.462 kg). Physical Exam The patient is well developed well nourished and well groomed. Orientation to person place and time is normal  Mood is pleasant. Ambulatory status normal and noncontributory Inspection reveals mild tenderness over the lateral epicondyles remaining elbow nontender full range of motion elbow is stable to valgus stress strength is normal skin is intact she has normal sensation and pulses no lymphadenopathy is a positive wrist extension resisted test for lateral pain  X-ray in the office today interpreted as negative  Data Reviewed X-ray as stated  Assessment    Encounter Diagnoses  Name Primary?  . Right elbow pain Yes  . Lateral epicondylitis, right         Plan    Recommend right  elbow injection with a combination of home exercises and 800 mg of ibuprofen twice a day for 14 days  Follow up in 5 weeks.  Procedure note injection for tennis elbow RIGHT  Diagnosis tennis elbow RT   Anesthesia ethyl chloride was used Alcohol use is clean the skin  After we obtained verbal consent and timeout a 25-gauge needle was used to inject 40 mg of Depo-Medrol and 3 cc of 1% lidocaine.  There were no complications and a sterile bandage was applied.

## 2014-08-15 LAB — VITAMIN D 25 HYDROXY (VIT D DEFICIENCY, FRACTURES): VIT D 25 HYDROXY: 25 ng/mL — AB (ref 30–100)

## 2014-08-19 LAB — FERRITIN: Ferritin: 33 ng/mL (ref 10–291)

## 2014-08-19 LAB — IRON: IRON: 84 ug/dL (ref 42–145)

## 2014-08-27 ENCOUNTER — Ambulatory Visit (INDEPENDENT_AMBULATORY_CARE_PROVIDER_SITE_OTHER): Payer: BLUE CROSS/BLUE SHIELD | Admitting: Gastroenterology

## 2014-08-27 ENCOUNTER — Encounter: Payer: Self-pay | Admitting: Gastroenterology

## 2014-08-27 ENCOUNTER — Other Ambulatory Visit: Payer: Self-pay

## 2014-08-27 VITALS — BP 108/69 | HR 66 | Temp 98.0°F | Ht 66.0 in | Wt 181.8 lb

## 2014-08-27 DIAGNOSIS — D649 Anemia, unspecified: Secondary | ICD-10-CM | POA: Insufficient documentation

## 2014-08-27 DIAGNOSIS — D520 Dietary folate deficiency anemia: Secondary | ICD-10-CM

## 2014-08-27 DIAGNOSIS — R1314 Dysphagia, pharyngoesophageal phase: Secondary | ICD-10-CM

## 2014-08-27 MED ORDER — PEG-KCL-NACL-NASULF-NA ASC-C 100 G PO SOLR: 1.0000 | ORAL | Status: DC

## 2014-08-27 NOTE — Assessment & Plan Note (Signed)
54 year old female with new onset of symptoms 4-5 months ago in the setting of intermittent GERD; some improvement noted with Nexium. Notes "strangling" with eating and drinking, but no true esophageal dysphagia per report. May have occult cervical web, stricture, ring. Unable to exclude motility disorder. Doubt occult malignancy. Recommend EGD with possible dilation as appropriate. As of note, normocytic anemia noted (see anemia). EGD also for evaluation of any etiology for possible evolving IDA.   Proceed with upper endoscopy and dilation in the near future with Dr. Oneida Alar. The risks, benefits, and alternatives have been discussed in detail with patient. They have stated understanding and desire to proceed.  Continue Nexium once daily.

## 2014-08-27 NOTE — Assessment & Plan Note (Signed)
Upon review of chart, history of chronic, normocytic anemia. Recent iron and ferritin added on to labs, showing iron 84 (improved from 3 years ago), but ferritin on the lower end of normal at 33, down from 3 years ago. Hgb actually 10.7, down 2 grams from a year ago. No overt signs of GI bleeding. Hemoccult status unknown. With last colonoscopy in 2010, would recommend early interval colonoscopy at time of EGD to assess for any occult etiology for possible evolving IDA.   Proceed with colonoscopy with Dr. Oneida Alar in the near future. The risks, benefits, and alternatives have been discussed in detail with the patient. They state understanding and desire to proceed.

## 2014-08-27 NOTE — Progress Notes (Signed)
cc'ed to pcp °

## 2014-08-27 NOTE — Progress Notes (Signed)
Primary Care Physician:  Tula Nakayama, MD Primary Gastroenterologist:  Dr. Oneida Alar   Chief Complaint  Patient presents with  . Dysphagia    4-5 months    HPI:   Lori Sims is a 54 y.o. female presenting today at the request of Dr. Moshe Cipro secondary to dysphagia.   Symptom onset about 4-5 months ago. Will be eating, drinking, gets strangled. Corn, string beans, veggies may cause issues. Sometimes fruits. No odynophagia. Severe reflux occasionally. Nexium 40 mg daily. Since starting Nexium, noted improvement in symptoms of difficulty with eating. Now mainly drinking causes issues. Started last month. No N/V. No abdominal pain.   Notes history of mild, intermittent constipation. However, lately has not had any issues. No hematochezia. No unexplained weight loss or lack of appetite. Last colonoscopy in 2010 normal by Dr. Laural Golden.   Upon review of chart, history of chronic, normocytic anemia. Recent iron and ferritin added on to labs, showing iron 84 (improved from 3 years ago), but ferritin on the lower end of normal at 33, down from 3 years ago. Hgb actually 10.7, down 2 grams from a year ago.   Past Medical History  Diagnosis Date  . Hypertension   . Hypercholesterolemia   . GERD (gastroesophageal reflux disease)     Past Surgical History  Procedure Laterality Date  . Colonoscopy  2010    Dr. Laural Golden: normal except external hemorrhoids  . Fibroid tumor right breast    . Fibroid tumors left breast    . Ovarian cyst removal    . Ovarian cyst drainage    . Cesarean section      Current Outpatient Prescriptions  Medication Sig Dispense Refill  . esomeprazole (NEXIUM) 40 MG packet Take 40 mg by mouth daily before breakfast. 30 each 1  . fluticasone (FLONASE) 50 MCG/ACT nasal spray Place 2 sprays into both nostrils daily. 16 g 5  . Multiple Vitamin (MULTIVITAMIN) tablet Take 1 tablet by mouth daily.    . pravastatin (PRAVACHOL) 80 MG tablet Take 1 tablet (80 mg  total) by mouth daily. 30 tablet 5  . temazepam (RESTORIL) 30 MG capsule Take 1 capsule (30 mg total) by mouth at bedtime as needed for sleep. 30 capsule 3  . triamterene-hydrochlorothiazide (MAXZIDE-25) 37.5-25 MG per tablet TAKE ONE TABLET BY MOUTH ONCE DAILY 30 tablet 5   No current facility-administered medications for this visit.    Allergies as of 08/27/2014 - Review Complete 08/27/2014  Allergen Reaction Noted  . Penicillins  04/28/2008  . Tramadol Nausea And Vomiting 07/18/2014    Family History  Problem Relation Age of Onset  . Colon cancer Neg Hx     History   Social History  . Marital Status: Divorced    Spouse Name: N/A  . Number of Children: N/A  . Years of Education: N/A   Occupational History  . [employer]    Social History Main Topics  . Smoking status: Never Smoker   . Smokeless tobacco: Not on file  . Alcohol Use: 0.0 oz/week    0 Standard drinks or equivalent per week     Comment: rare wine  . Drug Use: No  . Sexual Activity: Not on file   Other Topics Concern  . Not on file   Social History Narrative    Review of Systems: Gen: Denies any fever, chills, fatigue, weight loss, lack of appetite.  CV: Denies chest pain, heart palpitations, peripheral edema, syncope.  Resp: +DOE GI: see  HPI GU : Denies urinary burning, urinary frequency, urinary hesitancy MS: tendonitis right arm Derm: Denies rash, itching, dry skin Psych: Denies depression, anxiety, memory loss, and confusion Heme: Denies bruising, bleeding, and enlarged lymph nodes.  Physical Exam: BP 108/69 mmHg  Pulse 66  Temp(Src) 98 F (36.7 C) (Oral)  Ht 5\' 6"  (1.676 m)  Wt 181 lb 12.8 oz (82.464 kg)  BMI 29.36 kg/m2  LMP 08/26/2014 General:   Alert and oriented. Pleasant and cooperative. Well-nourished and well-developed.  Head:  Normocephalic and atraumatic. Eyes:  Without icterus, sclera clear and conjunctiva pink.  Ears:  Normal auditory acuity. Nose:  No deformity, discharge,  or  lesions. Mouth:  No deformity or lesions, oral mucosa pink.  Lungs:  Clear to auscultation bilaterally. No wheezes, rales, or rhonchi. No distress.  Heart:  S1, S2 present without murmurs appreciated.  Abdomen:  +BS, soft, non-tender and non-distended. No HSM noted. No guarding or rebound. No masses appreciated.  Rectal:  Deferred  Msk:  Symmetrical without gross deformities. Normal posture. Pulses:  Normal pulses noted. Extremities:  Without clubbing or edema. Neurologic:  Alert and  oriented x4;  grossly normal neurologically. Skin:  Intact without significant lesions or rashes. Psych:  Alert and cooperative. Normal mood and affect.   Lab Results  Component Value Date   WBC 6.1 08/14/2014   HGB 10.7* 08/14/2014   HCT 31.3* 08/14/2014   MCV 89.9 08/14/2014   PLT 268 08/14/2014   Lab Results  Component Value Date   IRON 84 08/14/2014   TIBC 309 12/02/2010   FERRITIN 33 08/14/2014

## 2014-08-27 NOTE — Patient Instructions (Signed)
Continue taking Nexium once each morning, 30 minutes before breakfast.   We have scheduled you for an upper endoscopy and possible dilation with Dr. Oneida Alar.

## 2014-09-12 ENCOUNTER — Encounter (HOSPITAL_COMMUNITY)
Admission: RE | Disposition: A | Discharge status: Home / Self Care | Payer: Self-pay | Source: Ambulatory Visit | Attending: Gastroenterology

## 2014-09-12 ENCOUNTER — Encounter (HOSPITAL_COMMUNITY): Payer: Self-pay | Admitting: *Deleted

## 2014-09-12 ENCOUNTER — Ambulatory Visit (HOSPITAL_COMMUNITY)
Admission: RE | Admit: 2014-09-12 | Discharge: 2014-09-12 | Disposition: A | Discharge status: Home / Self Care | Payer: BLUE CROSS/BLUE SHIELD | Source: Ambulatory Visit | Attending: Gastroenterology | Admitting: Gastroenterology

## 2014-09-12 DIAGNOSIS — K2970 Gastritis, unspecified, without bleeding: Secondary | ICD-10-CM

## 2014-09-12 DIAGNOSIS — D120 Benign neoplasm of cecum: Secondary | ICD-10-CM | POA: Diagnosis not present

## 2014-09-12 DIAGNOSIS — Z7951 Long term (current) use of inhaled steroids: Secondary | ICD-10-CM | POA: Insufficient documentation

## 2014-09-12 DIAGNOSIS — D649 Anemia, unspecified: Secondary | ICD-10-CM | POA: Diagnosis not present

## 2014-09-12 DIAGNOSIS — R1314 Dysphagia, pharyngoesophageal phase: Secondary | ICD-10-CM | POA: Diagnosis not present

## 2014-09-12 DIAGNOSIS — K644 Residual hemorrhoidal skin tags: Secondary | ICD-10-CM | POA: Diagnosis not present

## 2014-09-12 DIAGNOSIS — I10 Essential (primary) hypertension: Secondary | ICD-10-CM | POA: Insufficient documentation

## 2014-09-12 DIAGNOSIS — K2960 Other gastritis without bleeding: Secondary | ICD-10-CM | POA: Insufficient documentation

## 2014-09-12 DIAGNOSIS — E780 Pure hypercholesterolemia: Secondary | ICD-10-CM | POA: Diagnosis not present

## 2014-09-12 DIAGNOSIS — K317 Polyp of stomach and duodenum: Secondary | ICD-10-CM | POA: Diagnosis not present

## 2014-09-12 DIAGNOSIS — D520 Dietary folate deficiency anemia: Secondary | ICD-10-CM

## 2014-09-12 DIAGNOSIS — R1310 Dysphagia, unspecified: Secondary | ICD-10-CM | POA: Diagnosis present

## 2014-09-12 DIAGNOSIS — Z79899 Other long term (current) drug therapy: Secondary | ICD-10-CM | POA: Insufficient documentation

## 2014-09-12 DIAGNOSIS — K219 Gastro-esophageal reflux disease without esophagitis: Secondary | ICD-10-CM | POA: Insufficient documentation

## 2014-09-12 DIAGNOSIS — K621 Rectal polyp: Secondary | ICD-10-CM

## 2014-09-12 HISTORY — PX: COLONOSCOPY: SHX5424

## 2014-09-12 HISTORY — PX: ESOPHAGEAL DILATION: SHX303

## 2014-09-12 HISTORY — PX: ESOPHAGOGASTRODUODENOSCOPY: SHX5428

## 2014-09-12 SURGERY — COLONOSCOPY
Anesthesia: Moderate Sedation

## 2014-09-12 MED ORDER — SODIUM CHLORIDE 0.9 % IV SOLN
INTRAVENOUS | Status: DC
  Administered 2014-09-12: 10:00:00 via INTRAVENOUS

## 2014-09-12 MED ORDER — MIDAZOLAM HCL 5 MG/5ML IJ SOLN
INTRAMUSCULAR | Status: AC
  Filled 2014-09-12: qty 10

## 2014-09-12 MED ORDER — ONDANSETRON HCL 4 MG/2ML IJ SOLN
INTRAMUSCULAR | Status: DC | PRN
  Administered 2014-09-12: 4 mg via INTRAVENOUS

## 2014-09-12 MED ORDER — ONDANSETRON HCL 4 MG/2ML IJ SOLN
INTRAMUSCULAR | Status: AC
  Filled 2014-09-12: qty 2

## 2014-09-12 MED ORDER — MINERAL OIL PO OIL
TOPICAL_OIL | ORAL | Status: AC
  Filled 2014-09-12: qty 30

## 2014-09-12 MED ORDER — MIDAZOLAM HCL 5 MG/5ML IJ SOLN
INTRAMUSCULAR | Status: DC | PRN
  Administered 2014-09-12 (×2): 1 mg via INTRAVENOUS
  Administered 2014-09-12: 2 mg via INTRAVENOUS
  Administered 2014-09-12: 1 mg via INTRAVENOUS
  Administered 2014-09-12: 2 mg via INTRAVENOUS
  Administered 2014-09-12: 1 mg via INTRAVENOUS
  Administered 2014-09-12: 2 mg via INTRAVENOUS

## 2014-09-12 MED ORDER — MEPERIDINE HCL 100 MG/ML IJ SOLN
INTRAMUSCULAR | Status: DC | PRN
  Administered 2014-09-12 (×4): 25 mg via INTRAVENOUS

## 2014-09-12 MED ORDER — LIDOCAINE VISCOUS 2 % MT SOLN
OROMUCOSAL | Status: AC
  Filled 2014-09-12: qty 15

## 2014-09-12 MED ORDER — MEPERIDINE HCL 100 MG/ML IJ SOLN
INTRAMUSCULAR | Status: AC
  Filled 2014-09-12: qty 2

## 2014-09-12 MED ORDER — STERILE WATER FOR IRRIGATION IR SOLN
Status: DC | PRN
  Administered 2014-09-12: 11:00:00

## 2014-09-12 NOTE — Discharge Instructions (Signed)
You had 3 polyps removed. YOU HAVE A mild GASTRITIS AND SMALL STOMACH POLYPS. I STRETCHED YOUR ESOPHAGUS DUE TO YOUR PROBLEMS SWALLOWING. I BIOPSIED YOUR STOMACH AND SMALL BOWEL.   FOLLOW A HIGH FIBER DIET. AVOID ITEMS THAT CAUSE BLOATING. SEE INFO BELOW.  YOUR BIOPSY RESULTS WILL BE AVAILABLE IN MY CHART AFTER APR 5 AND MY OFFICE WILL CONTACT YOU IN 10-14 DAYS WITH YOUR RESULTS.   Follow up in 4 mos.  Next colonoscopy in 3-5 years.   ENDOSCOPY Care After Read the instructions outlined below and refer to this sheet in the next week. These discharge instructions provide you with general information on caring for yourself after you leave the hospital. While your treatment has been planned according to the most current medical practices available, unavoidable complications occasionally occur. If you have any problems or questions after discharge, call DR. Jazilyn Siegenthaler, [phone].  ACTIVITY  You may resume your regular activity, but move at a slower pace for the next 24 hours.   Take frequent rest periods for the next 24 hours.   Walking will help get rid of the air and reduce the bloated feeling in your belly (abdomen).   No driving for 24 hours (because of the medicine (anesthesia) used during the test).   You may shower.   Do not sign any important legal documents or operate any machinery for 24 hours (because of the anesthesia used during the test).    NUTRITION  Drink plenty of fluids.   You may resume your normal diet as instructed by your doctor.   Begin with a light meal and progress to your normal diet. Heavy or fried foods are harder to digest and may make you feel sick to your stomach (nauseated).   Avoid alcoholic beverages for 24 hours or as instructed.    MEDICATIONS  You may resume your normal medications.   WHAT YOU CAN EXPECT TODAY  Some feelings of bloating in the abdomen.   Passage of more gas than usual.   Spotting of blood in your stool or on the  toilet paper  .  IF YOU HAD POLYPS REMOVED DURING THE ENDOSCOPY:  Eat a soft diet IF YOU HAVE NAUSEA, BLOATING, ABDOMINAL PAIN, OR VOMITING.    FINDING OUT THE RESULTS OF YOUR TEST Not all test results are available during your visit. DR. Oneida Alar WILL CALL YOU WITHIN 14 DAYS OF YOUR PROCEDUE WITH YOUR RESULTS. Do not assume everything is normal if you have not heard from DR. Dewan Emond, CALL HER OFFICE AT [phone].  SEEK IMMEDIATE MEDICAL ATTENTION AND CALL THE OFFICE: [phone] IF:  You have more than a spotting of blood in your stool.   Your belly is swollen (abdominal distention).   You are nauseated or vomiting.   You have a temperature over 101F.   You have abdominal pain or discomfort that is severe or gets worse throughout the day.   High-Fiber Diet A high-fiber diet changes your normal diet to include more whole grains, legumes, fruits, and vegetables. Changes in the diet involve replacing refined carbohydrates with unrefined foods. The calorie level of the diet is essentially unchanged. The Dietary Reference Intake (recommended amount) for adult males is 38 grams per day. For adult females, it is 25 grams per day. Pregnant and lactating women should consume 28 grams of fiber per day. Fiber is the intact part of a plant that is not broken down during digestion. Functional fiber is fiber that has been isolated from the plant to provide  a beneficial effect in the body. PURPOSE  Increase stool bulk.   Ease and regulate bowel movements.   Lower cholesterol.  INDICATIONS THAT YOU NEED MORE FIBER  Constipation and hemorrhoids.   Uncomplicated diverticulosis (intestine condition) and irritable bowel syndrome.   Weight management.   As a protective measure against hardening of the arteries (atherosclerosis), diabetes, and cancer.   GUIDELINES FOR INCREASING FIBER IN THE DIET  Start adding fiber to the diet slowly. A gradual increase of about 5 more grams (2 slices of  whole-wheat bread, 2 servings of most fruits or vegetables, or 1 bowl of high-fiber cereal) per day is best. Too rapid an increase in fiber may result in constipation, flatulence, and bloating.   Drink enough water and fluids to keep your urine clear or pale yellow. Water, juice, or caffeine-free drinks are recommended. Not drinking enough fluid may cause constipation.   Eat a variety of high-fiber foods rather than one type of fiber.   Try to increase your intake of fiber through using high-fiber foods rather than fiber pills or supplements that contain small amounts of fiber.   The goal is to change the types of food eaten. Do not supplement your present diet with high-fiber foods, but replace foods in your present diet.  INCLUDE A VARIETY OF FIBER SOURCES  Replace refined and processed grains with whole grains, canned fruits with fresh fruits, and incorporate other fiber sources. White rice, white breads, and most bakery goods contain little or no fiber.   Brown whole-grain rice, buckwheat oats, and many fruits and vegetables are all good sources of fiber. These include: broccoli, Brussels sprouts, cabbage, cauliflower, beets, sweet potatoes, white potatoes (skin on), carrots, tomatoes, eggplant, squash, berries, fresh fruits, and dried fruits.   Cereals appear to be the richest source of fiber. Cereal fiber is found in whole grains and bran. Bran is the fiber-rich outer coat of cereal grain, which is largely removed in refining. In whole-grain cereals, the bran remains. In breakfast cereals, the largest amount of fiber is found in those with "bran" in their names. The fiber content is sometimes indicated on the label.   You may need to include additional fruits and vegetables each day.   In baking, for 1 cup white flour, you may use the following substitutions:   1 cup whole-wheat flour minus 2 tablespoons.   1/2 cup white flour plus 1/2 cup whole-wheat flour.    Polyps, Colon  A polyp  is extra tissue that grows inside your body. Colon polyps grow in the large intestine. The large intestine, also called the colon, is part of your digestive system. It is a long, hollow tube at the end of your digestive tract where your body makes and stores stool. Most polyps are not dangerous. They are benign. This means they are not cancerous. But over time, some types of polyps can turn into cancer. Polyps that are smaller than a pea are usually not harmful. But larger polyps could someday become or may already be cancerous. To be safe, doctors remove all polyps and test them.   WHO GETS POLYPS? Anyone can get polyps, but certain people are more likely than others. You may have a greater chance of getting polyps if:  You are over 50.   You have had polyps before.   Someone in your family has had polyps.   Someone in your family has had cancer of the large intestine.   Find out if someone in your family has  had polyps. You may also be more likely to get polyps if you:   Eat a lot of fatty foods   Smoke   Drink alcohol   Do not exercise  Eat too much   TREATMENT  The caregiver will remove the polyp during sigmoidoscopy or colonoscopy.  PREVENTION There is not one sure way to prevent polyps. You might be able to lower your risk of getting them if you:  Eat more fruits and vegetables and less fatty food.   Do not smoke.   Avoid alcohol.   Exercise every day.   Lose weight if you are overweight.   Eating more calcium and folate can also lower your risk of getting polyps. Some foods that are rich in calcium are milk, cheese, and broccoli. Some foods that are rich in folate are chickpeas, kidney beans, and spinach.     Gastritis  Gastritis is an inflammation (the body's way of reacting to injury and/or infection) of the stomach. It is often caused by viral or bacterial (germ) infections. It can also be caused BY ASPIRIN, BC/GOODY POWDER'S, (IBUPROFEN) MOTRIN, OR ALEVE  (NAPROXEN), chemicals (including alcohol), SPICY FOODS, and medications. This illness may be associated with generalized malaise (feeling tired, not well), UPPER ABDOMINAL STOMACH cramps, and fever. One common bacterial cause of gastritis is an organism known as H. Pylori. This can be treated with antibiotics.

## 2014-09-12 NOTE — Progress Notes (Signed)
REVIEWED-NO ADDITIONAL RECOMMENDATIONS. 

## 2014-09-12 NOTE — Op Note (Signed)
[facility] [address] [place], [address]   COLONOSCOPY PROCEDURE REPORT  PATIENT: Lori Sims, Lori Sims  MR#: [id number] BIRTHDATE: 09/18/1960 , 63  yrs. old GENDER: female ENDOSCOPIST: Danie Binder, MD REFERRED BE:[id number] Moshe Cipro, M.D. PROCEDURE DATE:  09-16-2014 PROCEDURE:   Colonoscopy with cold biopsy polypectomy INDICATIONS:anemia, non-specific. MEDICATIONS: Zofran 4mg  IV , Demerol 75 mg IV, and Versed 8 mg IV  DESCRIPTION OF PROCEDURE:    Physical exam was performed.  Informed consent was obtained from the patient after explaining the benefits, risks, and alternatives to procedure.  The patient was connected to monitor and placed in left lateral position. Continuous oxygen was provided by nasal cannula and IV medicine administered through an indwelling cannula.  After administration of sedation and rectal exam, the patients rectum was intubated and the EC-3890Li ([id number])  colonoscope was advanced under direct visualization to the cecum.  The scope was removed slowly by carefully examining the color, texture, anatomy, and integrity mucosa on the way out.  The patient was recovered in endoscopy and discharged home in satisfactory condition.    COLON FINDINGS: Three sessile polyps ranging from 4 to 45mm in size were found in the rectum and at the cecum.  A polypectomy was performed with cold forceps.  , The colon was redundant.  Manual abdominal counter-pressure was used to reach the cecum, and Moderate sized external hemorrhoids were found.  PREP QUALITY: good.   CECAL W/D TIME: 15       minutes COMPLICATIONS: None  ENDOSCOPIC IMPRESSION: 1.   Three COLON polyps 2.   The LEFT colon IS redundant 3.   Moderate sized external hemorrhoids  RECOMMENDATIONS: FOLLOW A HIGH FIBER DIET. AWAIT BIOPSY. Follow up in 4 mos.  CONSIDER BPE IF DYSPHAGIA PERSISTS.  NEEDS REPEAT CBC/FERRITIN Next colonoscopy in 3-5 years WITH AN OVERTUBE.     eSigned:  Danie Binder, MD 09-16-2014 7:06 PM    CPT CODES: ICD CODES:  The ICD and CPT codes recommended by this software are interpretations from the data that the clinical staff has captured with the software.  The verification of the translation of this report to the ICD and CPT codes and modifiers is the sole responsibility of the health care institution and practicing physician where this report was generated.  [place]. will not be held responsible for the validity of the ICD and CPT codes included on this report.  AMA assumes no liability for data contained or not contained herein. CPT is a Designer, television/film set of the [employer].

## 2014-09-12 NOTE — Op Note (Addendum)
[facility] [address] [place], [address]   ENDOSCOPY PROCEDURE REPORT  PATIENT: Lori Sims, Lori Sims  MR#: [id number] BIRTHDATE: 03-12-1961 , 40  yrs. old GENDER: female  ENDOSCOPIST: Danie Binder, MD REFERRED PT:[id number] Moshe Cipro, M.D.  PROCEDURE DATE: 2014-09-30 PROCEDURE:   EGD w/ biopsy and EGD w/ wire guided (savary) dilation   INDICATIONS:dysphagia.   anemia. MEDICATIONS: TCS+ Demerol 25 mg IV and Versed 2 mg IV TOPICAL ANESTHETIC:   Viscous Xylocaine ASA CLASS:  DESCRIPTION OF PROCEDURE:     Physical exam was performed.  Informed consent was obtained from the patient after explaining the benefits, risks, and alternatives to the procedure.  The patient was connected to the monitor and placed in the left lateral position.  Continuous oxygen was provided by nasal cannula and IV medicine administered through an indwelling cannula.  After administration of sedation, the patients esophagus was intubated and the EC-3890Li ([id number])  endoscope was advanced under direct visualization to the second portion of the duodenum.  The scope was removed slowly by carefully examining the color, texture, anatomy, and integrity of the mucosa on the way out.  The patient was recovered in endoscopy and discharged home in satisfactory condition.   ESOPHAGUS: The mucosa of the esophagus appeared normal. Empiric dilation performed using 15-16 mm SAVARY DILATORS DUE TO POSSIBLE PROXIMAL ESOPHAGEAL WEB. RESISTANCE: MINIMAL. HEME: NONE   STOMACH: Mild non-erosive gastritis (inflammation) was found in the gastric antrum.  Multiple biopsies were performed using cold forceps.   A few small polyps were found in the gastric body.  A polypectomy was performed with a cold forceps.   DUODENUM: The duodenal mucosa showed no abnormalities in the bulb and 2nd part of the duodenum.  COMPLICATIONS: There were no immediate complications.  ENDOSCOPIC IMPRESSION: 1.   NO OBVIOUS SOURCE FOR  ANEMIA IDENTIFIED 2.   MILD Non-erosive gastritis  RECOMMENDATIONS: FOLLOW A HIGH FIBER DIET. AWAIT BIOPSY. Follow up in 4 mos. CONSIDER BPE IF DYSPHAGIA PERSISTS. NEEDS REPEAT CBC/FERRITIN Next colonoscopy in 3-5 years WITH AN OVERTUBE.  REPEAT EXAM:  eSigned:  Danie Binder, MD 2014-09-30 7:00 PM Revised: 09-30-14 7:00 PM    CPT CODES: ICD CODES:  The ICD and CPT codes recommended by this software are interpretations from the data that the clinical staff has captured with the software.  The verification of the translation of this report to the ICD and CPT codes and modifiers is the sole responsibility of the health care institution and practicing physician where this report was generated.  [place]. will not be held responsible for the validity of the ICD and CPT codes included on this report.  AMA assumes no liability for data contained or not contained herein. CPT is a Designer, television/film set of the [employer].

## 2014-09-12 NOTE — H&P (Signed)
  Primary Care Physician:  Tula Nakayama, MD Primary Gastroenterologist:  Dr. Oneida Alar  Pre-Procedure History & Physical: HPI:  Lori Sims is a 54 y.o. female here for [facility].  Past Medical History  Diagnosis Date  . Hypertension   . Hypercholesterolemia   . GERD (gastroesophageal reflux disease)     Past Surgical History  Procedure Laterality Date  . Colonoscopy  2010    Dr. Laural Golden: normal except external hemorrhoids  . Fibroid tumor right breast    . Fibroid tumors left breast    . Ovarian cyst removal    . Ovarian cyst drainage    . Cesarean section      Prior to Admission medications   Medication Sig Start Date End Date Taking? Authorizing Provider  esomeprazole (NEXIUM) 40 MG packet Take 40 mg by mouth daily before breakfast. 07/17/14  Yes Fayrene Helper, MD  Multiple Vitamin (MULTIVITAMIN) tablet Take 1 tablet by mouth daily.   Yes Historical Provider, MD  peg 3350 powder (MOVIPREP) 100 G SOLR Take 1 kit (200 g total) by mouth as directed. 08/27/14  Yes Danie Binder, MD  pravastatin (PRAVACHOL) 80 MG tablet Take 1 tablet (80 mg total) by mouth daily. 07/24/14  Yes Fayrene Helper, MD  temazepam (RESTORIL) 30 MG capsule Take 1 capsule (30 mg total) by mouth at bedtime as needed for sleep. 07/24/14  Yes Fayrene Helper, MD  triamterene-hydrochlorothiazide (MAXZIDE-25) 37.5-25 MG per tablet TAKE ONE TABLET BY MOUTH ONCE DAILY 07/24/14  Yes Fayrene Helper, MD  fluticasone [facility]) 50 MCG/ACT nasal spray Place 2 sprays into both nostrils daily. 07/24/14 07/24/15  Fayrene Helper, MD    Allergies as of 08/27/2014 - Review Complete 08/27/2014  Allergen Reaction Noted  . Penicillins  04/28/2008  . Tramadol Nausea And Vomiting 07/18/2014    Family History  Problem Relation Age of Onset  . Colon cancer Neg Hx     History   Social History  . Marital Status: Divorced    Spouse Name: N/A  . Number of Children: N/A  . Years of Education: N/A    Occupational History  . [employer]    Social History Main Topics  . Smoking status: Never Smoker   . Smokeless tobacco: Not on file  . Alcohol Use: 0.0 oz/week    0 Standard drinks or equivalent per week     Comment: rare wine  . Drug Use: No  . Sexual Activity: Not on file   Other Topics Concern  . Not on file   Social History Narrative    Review of Systems: See HPI, otherwise negative ROS   Physical Exam: BP 129/71 mmHg  Pulse 58  Temp(Src) 97.6 F (36.4 C) (Oral)  Resp 18  Ht $R'5\' 6"'vi$  (1.676 m)  Wt 181 lb (82.101 kg)  BMI 29.23 kg/m2  SpO2 99%  LMP 08/26/2014 General:   Alert,  pleasant and cooperative in NAD Head:  Normocephalic and atraumatic. Neck:  Supple; Lungs:  Clear throughout to auscultation.    Heart:  Regular rate and rhythm. Abdomen:  Soft, nontender and nondistended. Normal bowel sounds, without guarding, and without rebound.   Neurologic:  Alert and  oriented x4;  grossly normal neurologically.  Impression/Plan:   DYSPHAGIA/ANEMIA PLAN:  TCS/EGD/DIL TODAY

## 2014-09-14 ENCOUNTER — Other Ambulatory Visit: Payer: Self-pay | Admitting: Family Medicine

## 2014-09-15 ENCOUNTER — Encounter (HOSPITAL_COMMUNITY): Payer: Self-pay | Admitting: Gastroenterology

## 2014-09-23 ENCOUNTER — Ambulatory Visit: Payer: BLUE CROSS/BLUE SHIELD | Admitting: Orthopedic Surgery

## 2014-09-26 ENCOUNTER — Telehealth: Payer: Self-pay | Admitting: Gastroenterology

## 2014-09-26 NOTE — Telephone Encounter (Signed)
Called and LMOM 

## 2014-09-26 NOTE — Telephone Encounter (Signed)
Please call pt. She had simple adenomas removed.    FOLLOW A HIGH FIBER DIET. AVOID ITEMS THAT CAUSE BLOATING.   Follow up in 4 mos E30 DYSPHAGIA/ANEMIA. RECHECK CBC/FERRITIN 1 WEEK PRIOR TO NEXT OPV. PT MAY NEED CAPSULE ENDOSCOPY.  Next colonoscopy in 5 years with AN OVERTUBE.

## 2014-09-29 ENCOUNTER — Encounter: Payer: Self-pay | Admitting: Gastroenterology

## 2014-09-29 ENCOUNTER — Other Ambulatory Visit: Payer: Self-pay

## 2014-09-29 DIAGNOSIS — D509 Iron deficiency anemia, unspecified: Secondary | ICD-10-CM

## 2014-09-29 NOTE — Telephone Encounter (Signed)
Pt is aware of results. Lab orders on file for 01/21/2015.

## 2014-09-29 NOTE — Telephone Encounter (Signed)
Reminder in epic and OV made °

## 2014-09-29 NOTE — Telephone Encounter (Signed)
APPOINTMENT MADE LETTER SENT AND ON RECALL FOR TCS

## 2014-10-21 ENCOUNTER — Other Ambulatory Visit: Payer: Self-pay

## 2014-10-21 DIAGNOSIS — K219 Gastro-esophageal reflux disease without esophagitis: Secondary | ICD-10-CM

## 2014-10-21 MED ORDER — ESOMEPRAZOLE MAGNESIUM 40 MG PO CPDR: DELAYED_RELEASE_CAPSULE | ORAL | Status: DC

## 2014-10-27 ENCOUNTER — Telehealth: Payer: Self-pay | Admitting: Family Medicine

## 2014-10-28 NOTE — Telephone Encounter (Signed)
Called patient and left message for them to return call at the office   

## 2014-10-28 NOTE — Telephone Encounter (Signed)
Patient had a visit summary come over from a minute clinic visit this am

## 2014-11-01 ENCOUNTER — Emergency Department (HOSPITAL_COMMUNITY)
Admission: EM | Admit: 2014-11-01 | Discharge: 2014-11-01 | Disposition: A | Discharge status: Home / Self Care | Payer: BLUE CROSS/BLUE SHIELD | Source: Home / Self Care | Attending: Family Medicine | Admitting: Family Medicine

## 2014-11-01 ENCOUNTER — Other Ambulatory Visit (HOSPITAL_COMMUNITY)
Admission: RE | Admit: 2014-11-01 | Discharge: 2014-11-01 | Disposition: A | Discharge status: Home / Self Care | Payer: BLUE CROSS/BLUE SHIELD | Source: Ambulatory Visit | Attending: Family Medicine | Admitting: Family Medicine

## 2014-11-01 ENCOUNTER — Encounter (HOSPITAL_COMMUNITY): Payer: Self-pay | Admitting: Emergency Medicine

## 2014-11-01 DIAGNOSIS — N760 Acute vaginitis: Secondary | ICD-10-CM | POA: Insufficient documentation

## 2014-11-01 DIAGNOSIS — N939 Abnormal uterine and vaginal bleeding, unspecified: Secondary | ICD-10-CM | POA: Diagnosis not present

## 2014-11-01 DIAGNOSIS — Z113 Encounter for screening for infections with a predominantly sexual mode of transmission: Secondary | ICD-10-CM | POA: Diagnosis not present

## 2014-11-01 LAB — POCT PREGNANCY, URINE: Preg Test, Ur: NEGATIVE

## 2014-11-01 LAB — POCT URINALYSIS DIP (DEVICE)
Glucose, UA: NEGATIVE mg/dL
KETONES UR: NEGATIVE mg/dL
LEUKOCYTES UA: NEGATIVE
Nitrite: NEGATIVE
Protein, ur: 100 mg/dL — AB
Specific Gravity, Urine: 1.025 (ref 1.005–1.030)
UROBILINOGEN UA: 0.2 mg/dL (ref 0.0–1.0)
pH: 7 (ref 5.0–8.0)

## 2014-11-01 NOTE — ED Provider Notes (Signed)
CSN: [id number]     Arrival date & time 11/01/14  7425 History   First MD Initiated Contact with Patient 11/01/14 1047     Chief Complaint  Patient presents with  . Pelvic Pain  . Vaginal Bleeding   (Consider location/radiation/quality/duration/timing/severity/associated sxs/prior Treatment) HPI  6 days ago developed frequency and vaginal discharge. Getting worse. Developed vaginal bleeding over the past 2 days. Problem is intermittent. Associated w/ abdominal discomfort but denies dysuria.   Still has menstrual cycles every 2-3 mo. This cycle is much worse than normal. H/o endometriosis. Sexually active w/o condoms.    Started on Macrobid for UTI 5 days ago.   She reports 2 endometrial biopsies in the last 23 years of all shown endometriosis.   Past Medical History  Diagnosis Date  . Hypertension   . Hypercholesterolemia   . GERD (gastroesophageal reflux disease)    Past Surgical History  Procedure Laterality Date  . Colonoscopy  2010    Dr. Laural Golden: normal except external hemorrhoids  . Fibroid tumor right breast    . Fibroid tumors left breast    . Ovarian cyst removal    . Ovarian cyst drainage    . Cesarean section    . Colonoscopy N/A 09/12/2014    Procedure: COLONOSCOPY;  Surgeon: Danie Binder, MD;  Location: [facility];  Service: Endoscopy;  Laterality: N/A;  1030   . Esophagogastroduodenoscopy N/A 09/12/2014    Procedure: ESOPHAGOGASTRODUODENOSCOPY (EGD);  Surgeon: Danie Binder, MD;  Location: [facility];  Service: Endoscopy;  Laterality: N/A;  . Esophageal dilation N/A 09/12/2014    Procedure: ESOPHAGEAL DILATION;  Surgeon: Danie Binder, MD;  Location: [facility];  Service: Endoscopy;  Laterality: N/A;   Family History  Problem Relation Age of Onset  . Colon cancer Neg Hx   . Diabetes Mother   . Hypertension Father    History  Substance Use Topics  . Smoking status: Never Smoker   . Smokeless tobacco: Not on file  . Alcohol Use: 0.0 oz/week    0  Standard drinks or equivalent per week     Comment: rare wine   OB History    No data available     Review of Systems Per HPI with all other pertinent systems negative.   Allergies  Penicillins and Tramadol  Home Medications   Prior to Admission medications   Medication Sig Start Date End Date Taking? Authorizing Provider  esomeprazole (NEXIUM) 40 MG capsule TAKE ONE CAPSULE BY MOUTH EVERY DAY BEFORE BREAKFAST 10/21/14  Yes Fayrene Helper, MD  pravastatin (PRAVACHOL) 80 MG tablet Take 1 tablet (80 mg total) by mouth daily. 07/24/14  Yes Fayrene Helper, MD  temazepam (RESTORIL) 30 MG capsule Take 1 capsule (30 mg total) by mouth at bedtime as needed for sleep. 07/24/14  Yes Fayrene Helper, MD  triamterene-hydrochlorothiazide (MAXZIDE-25) 37.5-25 MG per tablet TAKE ONE TABLET BY MOUTH ONCE DAILY 07/24/14  Yes Fayrene Helper, MD  fluticasone [facility]) 50 MCG/ACT nasal spray Place 2 sprays into both nostrils daily. 07/24/14 07/24/15  Fayrene Helper, MD  Multiple Vitamin (MULTIVITAMIN) tablet Take 1 tablet by mouth daily.    Historical Provider, MD   BP 144/71 mmHg  Pulse 63  Temp(Src) 98.2 F (36.8 C) (Oral)  Resp 18  SpO2 100% Physical Exam Physical Exam  Constitutional: oriented to person, place, and time. appears well-developed and well-nourished. No distress.  HENT:  Head: Normocephalic and atraumatic.  Eyes: EOMI. PERRL.  Neck: Normal range of motion.  Cardiovascular: RRR, no m/r/g, 2+ distal pulses,  Pulmonary/Chest: Effort normal and breath sounds normal. No respiratory distress.  Abdominal: Soft. Bowel sounds are normal. NonTTP, no distension.  Musculoskeletal: Normal range of motion. Non ttp, no effusion.  Neurological: alert and oriented to person, place, and time.  Skin: Skin is warm. No rash noted. non diaphoretic.  Psychiatric: normal mood and affect. behavior is normal. Judgment and thought content normal.  GU: Vaginal walls without evidence of  lesions, no cervical motion tenderness, copious blood in the vaginal vault.  ED Course  Procedures (including critical care time) Labs Review Labs Reviewed  POCT URINALYSIS DIP (DEVICE) - Abnormal; Notable for the following:    Bilirubin Urine SMALL (*)    Hgb urine dipstick LARGE (*)    Protein, ur 100 (*)    All other components within normal limits  POCT PREGNANCY, URINE    Imaging Review No results found.   MDM   1. Vaginal bleeding    Likely excessive vaginal bleeding from several months of a missed. The patient with history of endometriosis and multiple endometrial biopsies in the last 2-3 years that of all shown endometriosis without evidence of malignancy. Patient use ibuprofen for her crampy abdominal pain. Wet prep and STD panel sent, this patient with new sexual partner and is not protecting. Patient follow-up with her PCP if she develops further more frequent vaginal bleeding.    Waldemar Dickens, MD 11/01/14 1128

## 2014-11-01 NOTE — Discharge Instructions (Signed)
The cause of your vaginal bleeding is likely from menstrual irregularity for the beginnings of menopause. We'll let you know if there is any sign of infection he seek further medical attention through her primary care's office or OB/GYN if her bleeding becomes more frequent and more severe. Please ibuprofen for abdominal crampy relief. Please continue your Macrobid for her UTI. It appears that this is working.

## 2014-11-01 NOTE — ED Notes (Signed)
Reports lower pelvic/abdominal cramping since Friday.  States "dark red vaginal bleeding started last night".   Pt is perimenopausal.  No otc pain meds tried.   Denies fever, n/v/d.

## 2014-11-03 LAB — CERVICOVAGINAL ANCILLARY ONLY
CHLAMYDIA, DNA PROBE: NEGATIVE
Neisseria Gonorrhea: NEGATIVE
Wet Prep (BD Affirm): POSITIVE — AB

## 2014-11-17 NOTE — ED Notes (Signed)
Message to Dr Barbaraann Faster

## 2014-11-20 NOTE — ED Notes (Signed)
Called to inquire about patient condition. Left detailed Vm on cell number . Called and authorized Rx to [facility], [place], [place] for Flagyl 500 mg PO, BID x 7 days. Left message on pharmacy answering machine

## 2014-12-29 ENCOUNTER — Other Ambulatory Visit: Payer: Self-pay

## 2014-12-29 DIAGNOSIS — D509 Iron deficiency anemia, unspecified: Secondary | ICD-10-CM

## 2015-01-29 ENCOUNTER — Ambulatory Visit: Payer: BLUE CROSS/BLUE SHIELD | Admitting: Gastroenterology

## 2015-01-30 ENCOUNTER — Other Ambulatory Visit: Payer: Self-pay | Admitting: Family Medicine

## 2015-02-03 ENCOUNTER — Encounter: Payer: Self-pay | Admitting: Family Medicine

## 2015-02-03 ENCOUNTER — Ambulatory Visit (INDEPENDENT_AMBULATORY_CARE_PROVIDER_SITE_OTHER): Payer: BLUE CROSS/BLUE SHIELD | Admitting: Family Medicine

## 2015-02-03 VITALS — BP 122/80 | HR 74 | Resp 16 | Ht 66.0 in | Wt 180.0 lb

## 2015-02-03 DIAGNOSIS — J309 Allergic rhinitis, unspecified: Secondary | ICD-10-CM

## 2015-02-03 DIAGNOSIS — E663 Overweight: Secondary | ICD-10-CM

## 2015-02-03 DIAGNOSIS — M25521 Pain in right elbow: Secondary | ICD-10-CM

## 2015-02-03 DIAGNOSIS — I10 Essential (primary) hypertension: Secondary | ICD-10-CM

## 2015-02-03 DIAGNOSIS — E785 Hyperlipidemia, unspecified: Secondary | ICD-10-CM

## 2015-02-03 DIAGNOSIS — E8881 Metabolic syndrome: Secondary | ICD-10-CM

## 2015-02-03 DIAGNOSIS — K219 Gastro-esophageal reflux disease without esophagitis: Secondary | ICD-10-CM

## 2015-02-03 MED ORDER — TRIAMTERENE-HCTZ 37.5-25 MG PO TABS: ORAL_TABLET | ORAL | Status: DC

## 2015-02-03 NOTE — Patient Instructions (Signed)
F/u end December, call if you need me before  Discontinue pravachol starting today  Fasting lipid, chem 7 , cBC  You need colonoscopy in 5 years bsed on report   Please schedule mammogram  It is important that you exercise regularly at least 30 minutes 5 times a week. If you develop chest pain, have severe difficulty breathing, or feel very tired, stop exercising immediately and seek medical attention    Please work on good  health habits so that your health will improve. 1. Commitment to daily physical activity for 30 to 60  minutes, if you are able to do this.  2. Commitment to wise food choices. Aim for half of your  food intake to be vegetable and fruit, one quarter starchy foods, and one quarter protein. Try to eat on a regular schedule  3 meals per day, snacking between meals should be limited to vegetables or fruits or small portions of nuts. 64 ounces of water per day is generally recommended, unless you have specific health conditions, like heart failure or kidney failure where you will need to limit fluid intake.  3. Commitment to sufficient and a  good quality of physical and mental rest daily, generally between 6 to 8 hours per day.  WITH PERSISTANCE AND PERSEVERANCE, THE IMPOSSIBLE , BECOMES THE NORM!   Marland Kitchen

## 2015-02-17 NOTE — Assessment & Plan Note (Signed)
minimal symptoms, no regular use of any medication for past 6 months

## 2015-02-17 NOTE — Assessment & Plan Note (Signed)
Increased symptoms this year, controlled  With flonase , continue daily use

## 2015-02-17 NOTE — Assessment & Plan Note (Signed)
Controlled, no change in medication DASH diet and commitment to daily physical activity for a minimum of 30 minutes discussed and encouraged, as a part of hypertension management. The importance of attaining a healthy weight is also discussed.  BP/Weight 02/03/2015 11/01/2014 09/12/2014 08/27/2014 08/14/2014 10/15/6566 06/15/7515  Systolic BP 001 749 449 675 916 384 665  Diastolic BP 80 71 61 69 66 80 74  Wt. (Lbs) 180 - 181 181.8 184 184 176.8  BMI 29.07 - 29.23 29.36 28.81 28.81 27.68

## 2015-02-17 NOTE — Assessment & Plan Note (Signed)
improved following ortho management

## 2015-02-17 NOTE — Progress Notes (Signed)
Lori Sims     MRN: [id number]      DOB: 10/13/1960   HPI Lori Sims is here for follow up and re-evaluation of chronic medical conditions, medication management and review of any available recent lab and radiology data.  Preventive health is updated, specifically  Cancer screening and Immunization.   Questions or concerns regarding consultations or procedures which the PT has had in the interim are  addressed. The PT denies any adverse reactions to current medications since the last visit.  There are no new concerns.  There are no specific complaints   ROS Denies recent fever or chills. Denies sinus pressure, nasal congestion, ear pain or sore throat. Denies chest congestion, productive cough or wheezing. Denies chest pains, palpitations and leg swelling Denies abdominal pain, nausea, vomiting,diarrhea or constipation.   Denies dysuria, frequency, hesitancy or incontinence. Denies joint pain, swelling and limitation in mobility. Denies headaches, seizures, numbness, or tingling. Denies depression, anxiety or insomnia. Denies skin break down or rash.   PE  BP 122/80 mmHg  Pulse 74  Resp 16  Ht 5\' 6"  (1.676 m)  Wt 180 lb (81.647 kg)  BMI 29.07 kg/m2  SpO2 100%  Patient alert and oriented and in no cardiopulmonary distress.  HEENT: No facial asymmetry, EOMI,   oropharynx pink and moist.  Neck supple no JVD, no mass.  Chest: Clear to auscultation bilaterally.  CVS: S1, S2 no murmurs, no S3.Regular rate.  ABD: Soft non tender.   Ext: No edema  MS: Adequate ROM spine, shoulders, hips and knees.  Skin: Intact, no ulcerations or rash noted.  Psych: Good eye contact, normal affect. Memory intact not anxious or depressed appearing.  CNS: CN 2-12 intact, power,  normal throughout.no focal deficits noted.   Assessment & Plan  Essential hypertension Controlled, no change in medication DASH diet and commitment to daily physical activity for a minimum of 30 minutes  discussed and encouraged, as a part of hypertension management. The importance of attaining a healthy weight is also discussed.  BP/Weight 02/03/2015 11/01/2014 09/12/2014 08/27/2014 08/14/2014 0/06/270 10/14/6642  Systolic BP 034 742 595 638 756 433 295  Diastolic BP 80 71 61 69 66 80 74  Wt. (Lbs) 180 - 181 181.8 184 184 176.8  BMI 29.07 - 29.23 29.36 28.81 28.81 27.68        Allergic rhinitis Increased symptoms this year, controlled  With flonase , continue daily use  GERD (gastroesophageal reflux disease) minimal symptoms, no regular use of any medication for past 6 months  Hyperlipidemia LDL goal <100 Hyperlipidemia:Low fat diet discussed and encouraged.   Lipid Panel  Lab Results  Component Value Date   CHOL 160 08/14/2014   HDL 64 08/14/2014   LDLCALC 78 08/14/2014   TRIG 92 08/14/2014   CHOLHDL 2.5 08/14/2014   Wants to d/c medicaton and repeat lab in 4 months, which is reasonable     Overweight (BMI 25.0-29.9) Unchanged Patient re-educated about  the importance of commitment to a  minimum of 150 minutes of exercise per week.  The importance of healthy food choices with portion control discussed. Encouraged to start a food diary, count calories and to consider  joining a support group. Sample diet sheets offered. Goals set by the patient for the next several months.   Weight /BMI 02/03/2015 09/12/2014 08/27/2014  WEIGHT 180 lb 181 lb 181 lb 12.8 oz  HEIGHT 5\' 6"  5\' 6"  5\' 6"   BMI 29.07 kg/m2 29.23 kg/m2 29.36 kg/m2  Current exercise per week 90 minutes.   Right elbow pain improved following ortho management

## 2015-02-17 NOTE — Assessment & Plan Note (Signed)
Hyperlipidemia:Low fat diet discussed and encouraged.   Lipid Panel  Lab Results  Component Value Date   CHOL 160 08/14/2014   HDL 64 08/14/2014   LDLCALC 78 08/14/2014   TRIG 92 08/14/2014   CHOLHDL 2.5 08/14/2014   Wants to d/c medicaton and repeat lab in 4 months, which is reasonable

## 2015-02-17 NOTE — Assessment & Plan Note (Signed)
Unchanged Patient re-educated about  the importance of commitment to a  minimum of 150 minutes of exercise per week.  The importance of healthy food choices with portion control discussed. Encouraged to start a food diary, count calories and to consider  joining a support group. Sample diet sheets offered. Goals set by the patient for the next several months.   Weight /BMI 02/03/2015 09/12/2014 08/27/2014  WEIGHT 180 lb 181 lb 181 lb 12.8 oz  HEIGHT 5\' 6"  5\' 6"  5\' 6"   BMI 29.07 kg/m2 29.23 kg/m2 29.36 kg/m2    Current exercise per week 90 minutes.

## 2015-02-25 ENCOUNTER — Other Ambulatory Visit: Payer: Self-pay | Admitting: Family Medicine

## 2015-03-03 ENCOUNTER — Other Ambulatory Visit: Payer: Self-pay | Admitting: Family Medicine

## 2015-03-03 DIAGNOSIS — Z1231 Encounter for screening mammogram for malignant neoplasm of breast: Secondary | ICD-10-CM

## 2015-03-23 ENCOUNTER — Ambulatory Visit (HOSPITAL_COMMUNITY)
Admission: RE | Admit: 2015-03-23 | Discharge: 2015-03-23 | Disposition: A | Discharge status: Home / Self Care | Payer: BLUE CROSS/BLUE SHIELD | Source: Ambulatory Visit | Attending: Family Medicine | Admitting: Family Medicine

## 2015-03-23 DIAGNOSIS — Z1231 Encounter for screening mammogram for malignant neoplasm of breast: Secondary | ICD-10-CM | POA: Insufficient documentation

## 2015-06-10 ENCOUNTER — Encounter: Payer: Self-pay | Admitting: Family Medicine

## 2015-06-10 ENCOUNTER — Ambulatory Visit (INDEPENDENT_AMBULATORY_CARE_PROVIDER_SITE_OTHER): Payer: BLUE CROSS/BLUE SHIELD | Admitting: Family Medicine

## 2015-06-10 ENCOUNTER — Other Ambulatory Visit: Payer: Self-pay | Admitting: Family Medicine

## 2015-06-10 VITALS — BP 120/78 | HR 70 | Resp 18 | Ht 66.0 in | Wt 179.0 lb

## 2015-06-10 DIAGNOSIS — L659 Nonscarring hair loss, unspecified: Secondary | ICD-10-CM | POA: Insufficient documentation

## 2015-06-10 DIAGNOSIS — E663 Overweight: Secondary | ICD-10-CM

## 2015-06-10 DIAGNOSIS — G4700 Insomnia, unspecified: Secondary | ICD-10-CM

## 2015-06-10 DIAGNOSIS — J3089 Other allergic rhinitis: Secondary | ICD-10-CM | POA: Diagnosis not present

## 2015-06-10 DIAGNOSIS — I10 Essential (primary) hypertension: Secondary | ICD-10-CM

## 2015-06-10 LAB — CBC
HEMATOCRIT: 35.5 % — AB (ref 36.0–46.0)
HEMOGLOBIN: 12 g/dL (ref 12.0–15.0)
MCH: 30.3 pg (ref 26.0–34.0)
MCHC: 33.8 g/dL (ref 30.0–36.0)
MCV: 89.6 fL (ref 78.0–100.0)
MPV: 10 fL (ref 8.6–12.4)
PLATELETS: 329 10*3/uL (ref 150–400)
RBC: 3.96 MIL/uL (ref 3.87–5.11)
RDW: 13.9 % (ref 11.5–15.5)
WBC: 7.9 10*3/uL (ref 4.0–10.5)

## 2015-06-10 MED ORDER — TRIAMTERENE-HCTZ 37.5-25 MG PO TABS: ORAL_TABLET | ORAL | Status: DC

## 2015-06-10 MED ORDER — TRAZODONE HCL 50 MG PO TABS: 25.0000 mg | ORAL_TABLET | Freq: Every day | ORAL | Status: DC

## 2015-06-10 NOTE — Patient Instructions (Signed)
F/u in 5 month, call if you need me sooner  New med for sleep and referral to dermatology re hair loss  Please work on good  health habits so that your health will improve. 1. Commitment to daily physical activity for 30 to 60  minutes, if you are able to do this.  2. Commitment to wise food choices. Aim for half of your  food intake to be vegetable and fruit, one quarter starchy foods, and one quarter protein. Try to eat on a regular schedule  3 meals per day, snacking between meals should be limited to vegetables or fruits or small portions of nuts. 64 ounces of water per day is generally recommended, unless you have specific health conditions, like heart failure or kidney failure where you will need to limit fluid intake.  3. Commitment to sufficient and a  good quality of physical and mental rest daily, generally between 6 to 8 hours per day.  WITH PERSISTANCE AND PERSEVERANCE, THE IMPOSSIBLE , BECOMES THE NORM!  Thanks for choosing [facility], we consider it a privelige to serve you.   All the best for 2017!

## 2015-06-10 NOTE — Assessment & Plan Note (Signed)
Four month h/o progressive hair loss, also is perimenopausal, no change in hair products , and no new medication. No excessive chemicals to hair, qwill get derm opinion, examined scal is healthy, no sign of fungal infection Hair loss likely related to hormonal changes, briefly mentioned rogaine but will await derm opinion

## 2015-06-10 NOTE — Assessment & Plan Note (Signed)
No current or recent flare, uses medication as needed

## 2015-06-10 NOTE — Assessment & Plan Note (Signed)
Uncontrolled, no longer responding to restoril Sleep hygiene reviewed and is good Start trazodone

## 2015-06-10 NOTE — Progress Notes (Signed)
Subjective:    Patient ID: Lori Sims, female    DOB: Aug 23, 1960, 54 y.o.   MRN: [id number]  HPI   Lori Sims     MRN: [id number]      DOB: Feb 01, 1961   HPI Lori Sims is here for follow up and re-evaluation of chronic medical conditions, medication management and review of any available recent lab and radiology data.  Preventive health is updated, specifically  Cancer screening and Immunization.   Questions or concerns regarding consultations or procedures which the PT has had in the interim are  addressed. The PT denies any adverse reactions to current medications since the last visit.  C/o ongoing hair loss with no clear trigger, this concerns her, also sleep has deteriorated and no longer responds to the restoril ROS Denies recent fever or chills. Denies sinus pressure, nasal congestion, ear pain or sore throat. Denies chest congestion, productive cough or wheezing. Denies chest pains, palpitations and leg swelling Denies abdominal pain, nausea, vomiting,diarrhea or constipation.   Denies dysuria, frequency, hesitancy or incontinence. Denies joint pain, swelling and limitation in mobility. Denies headaches, seizures, numbness, or tingling. Denies depression or  anxiety states her  Insomnia is bad, no longer responding to restoril. No new stress and negative depression screen   PE  BP 120/78 mmHg  Pulse 70  Resp 18  Ht 5\' 6"  (1.676 m)  Wt 179 lb (81.194 kg)  BMI 28.91 kg/m2  SpO2 100%  Patient alert and oriented and in no cardiopulmonary distress.  HEENT: No facial asymmetry, EOMI,   oropharynx pink and moist.  Neck supple no JVD, no mass.  Chest: Clear to auscultation bilaterally.  CVS: S1, S2 no murmurs, no S3.Regular rate.  ABD: Soft non tender.   Ext: No edema  MS: Adequate ROM spine, shoulders, hips and knees.  Skin: Intact, no ulcerations or rash noted.  Psych: Good eye contact, normal affect. Memory intact not anxious or depressed  appearing.  CNS: CN 2-12 intact, power,  normal throughout.no focal deficits noted.   Assessment & Plan   Essential hypertension Controlled, no change in medication DASH diet and commitment to daily physical activity for a minimum of 30 minutes discussed and encouraged, as a part of hypertension management. The importance of attaining a healthy weight is also discussed.  BP/Weight 06/10/2015 02/03/2015 11/01/2014 09/12/2014 08/27/2014 0000000 [id number]  Systolic BP 123456 123XX123 123456 123XX123 123XX123 123456 [id number]  Diastolic BP 78 80 71 61 69 66 80  Wt. (Lbs) 179 180 - 181 181.8 184 184  BMI 28.91 29.07 - 29.23 29.36 28.81 28.81        Alopecia Four month h/o progressive hair loss, also is perimenopausal, no change in hair products , and no new medication. No excessive chemicals to hair, qwill get derm opinion, examined scal is healthy, no sign of fungal infection Hair loss likely related to hormonal changes, briefly mentioned rogaine but will await derm opinion  Allergic rhinitis No current or recent flare, uses medication as needed  Overweight (BMI 25.0-29.9) Unchanged. Patient re-educated about  the importance of commitment to a  minimum of 150 minutes of exercise per week.  The importance of healthy food choices with portion control discussed. Encouraged to start a food diary, count calories and to consider  joining a support group. Sample diet sheets offered. Goals set by the patient for the next several months.   Weight /BMI 06/10/2015 02/03/2015 09/12/2014  WEIGHT 179 lb 180 lb 181 lb  HEIGHT 5\' 6"  5\' 6"   5\' 6"   BMI 28.91 kg/m2 29.07 kg/m2 29.23 kg/m2    Current exercise per week 60 minutes.   Insomnia Uncontrolled, no longer responding to restoril Sleep hygiene reviewed and is good Start trazodone       Review of Systems     Objective:   Physical Exam        Assessment & Plan:

## 2015-06-10 NOTE — Assessment & Plan Note (Signed)
Controlled, no change in medication DASH diet and commitment to daily physical activity for a minimum of 30 minutes discussed and encouraged, as a part of hypertension management. The importance of attaining a healthy weight is also discussed.  BP/Weight 06/10/2015 02/03/2015 11/01/2014 09/12/2014 08/27/2014 0000000 [id number]  Systolic BP 123456 123XX123 123456 123XX123 123XX123 123456 123XX123  Diastolic BP 78 80 71 61 69 66 80  Wt. (Lbs) 179 180 - 181 181.8 184 184  BMI 28.91 29.07 - 29.23 29.36 28.81 28.81

## 2015-06-10 NOTE — Assessment & Plan Note (Signed)
Unchanged. Patient re-educated about  the importance of commitment to a  minimum of 150 minutes of exercise per week.  The importance of healthy food choices with portion control discussed. Encouraged to start a food diary, count calories and to consider  joining a support group. Sample diet sheets offered. Goals set by the patient for the next several months.   Weight /BMI 06/10/2015 02/03/2015 09/12/2014  WEIGHT 179 lb 180 lb 181 lb  HEIGHT 5\' 6"  5\' 6"  5\' 6"   BMI 28.91 kg/m2 29.07 kg/m2 29.23 kg/m2    Current exercise per week 60 minutes.

## 2015-06-11 LAB — BASIC METABOLIC PANEL
BUN: 16 mg/dL (ref 7–25)
CALCIUM: 9.5 mg/dL (ref 8.6–10.4)
CHLORIDE: 102 mmol/L (ref 98–110)
CO2: 29 mmol/L (ref 20–31)
CREATININE: 1.08 mg/dL — AB (ref 0.50–1.05)
Glucose, Bld: 92 mg/dL (ref 65–99)
Potassium: 3.7 mmol/L (ref 3.5–5.3)
SODIUM: 143 mmol/L (ref 135–146)

## 2015-06-11 LAB — HEPATIC FUNCTION PANEL
ALBUMIN: 4.7 g/dL (ref 3.6–5.1)
ALK PHOS: 66 U/L (ref 33–130)
ALT: 15 U/L (ref 6–29)
AST: 23 U/L (ref 10–35)
Bilirubin, Direct: 0.1 mg/dL (ref <=0.2)
Indirect Bilirubin: 0.2 mg/dL (ref 0.2–1.2)
TOTAL PROTEIN: 7.8 g/dL (ref 6.1–8.1)
Total Bilirubin: 0.3 mg/dL (ref 0.2–1.2)

## 2015-06-11 LAB — LIPID PANEL
CHOL/HDL RATIO: 4.1 ratio (ref <=5.0)
Cholesterol: 257 mg/dL — ABNORMAL HIGH (ref 125–200)
HDL: 63 mg/dL (ref >=46)
LDL Cholesterol: 169 mg/dL — ABNORMAL HIGH (ref <130)
TRIGLYCERIDES: 125 mg/dL (ref <150)
VLDL: 25 mg/dL (ref <30)

## 2015-06-11 LAB — HIV ANTIBODY (ROUTINE TESTING W REFLEX): HIV: NONREACTIVE

## 2015-06-11 LAB — HEPATITIS C ANTIBODY: HCV AB: NEGATIVE

## 2015-06-11 NOTE — Addendum Note (Signed)
Addended by: Eual Fines on: 06/11/2015 02:00 PM   Modules accepted: Orders

## 2015-06-12 ENCOUNTER — Other Ambulatory Visit: Payer: Self-pay

## 2015-06-12 MED ORDER — ATORVASTATIN CALCIUM 20 MG PO TABS: 20.0000 mg | ORAL_TABLET | Freq: Every day | ORAL | Status: DC

## 2015-06-12 NOTE — Addendum Note (Signed)
Addended by: Eual Fines on: 06/12/2015 08:21 AM   Modules accepted: Orders

## 2015-07-22 ENCOUNTER — Encounter: Payer: Self-pay | Admitting: Family Medicine

## 2015-07-23 NOTE — Telephone Encounter (Signed)
Patient aware and will try this x 2 weeks.  She will message or call back with any further concerns.

## 2015-07-28 ENCOUNTER — Other Ambulatory Visit: Payer: Self-pay

## 2015-07-28 DIAGNOSIS — G4700 Insomnia, unspecified: Secondary | ICD-10-CM

## 2015-07-28 MED ORDER — TRAZODONE HCL 50 MG PO TABS: 25.0000 mg | ORAL_TABLET | Freq: Every day | ORAL | Status: DC

## 2015-10-31 ENCOUNTER — Other Ambulatory Visit: Payer: Self-pay | Admitting: Family Medicine

## 2015-10-31 LAB — COMPREHENSIVE METABOLIC PANEL
ALK PHOS: 73 U/L (ref 33–130)
ALT: 17 U/L (ref 6–29)
AST: 20 U/L (ref 10–35)
Albumin: 4.3 g/dL (ref 3.6–5.1)
BILIRUBIN TOTAL: 0.3 mg/dL (ref 0.2–1.2)
BUN: 16 mg/dL (ref 7–25)
CALCIUM: 9.4 mg/dL (ref 8.6–10.4)
CO2: 30 mmol/L (ref 20–31)
Chloride: 102 mmol/L (ref 98–110)
Creat: 1.14 mg/dL — ABNORMAL HIGH (ref 0.50–1.05)
Glucose, Bld: 100 mg/dL — ABNORMAL HIGH (ref 65–99)
POTASSIUM: 3.4 mmol/L — AB (ref 3.5–5.3)
Sodium: 142 mmol/L (ref 135–146)
TOTAL PROTEIN: 7.1 g/dL (ref 6.1–8.1)

## 2015-10-31 LAB — LIPID PANEL
CHOLESTEROL: 162 mg/dL (ref 125–200)
HDL: 61 mg/dL (ref >=46)
LDL CALC: 85 mg/dL (ref <130)
TRIGLYCERIDES: 80 mg/dL (ref <150)
Total CHOL/HDL Ratio: 2.7 Ratio (ref <=5.0)
VLDL: 16 mg/dL (ref <30)

## 2015-11-05 ENCOUNTER — Ambulatory Visit (INDEPENDENT_AMBULATORY_CARE_PROVIDER_SITE_OTHER): Payer: BLUE CROSS/BLUE SHIELD | Admitting: Family Medicine

## 2015-11-05 ENCOUNTER — Encounter: Payer: Self-pay | Admitting: Family Medicine

## 2015-11-05 VITALS — BP 126/78 | HR 76 | Resp 18 | Ht 66.0 in | Wt 184.0 lb

## 2015-11-05 DIAGNOSIS — G4700 Insomnia, unspecified: Secondary | ICD-10-CM | POA: Diagnosis not present

## 2015-11-05 DIAGNOSIS — M7712 Lateral epicondylitis, left elbow: Secondary | ICD-10-CM | POA: Diagnosis not present

## 2015-11-05 DIAGNOSIS — I10 Essential (primary) hypertension: Secondary | ICD-10-CM | POA: Diagnosis not present

## 2015-11-05 DIAGNOSIS — D508 Other iron deficiency anemias: Secondary | ICD-10-CM

## 2015-11-05 DIAGNOSIS — Z13220 Encounter for screening for lipoid disorders: Secondary | ICD-10-CM

## 2015-11-05 DIAGNOSIS — G478 Other sleep disorders: Secondary | ICD-10-CM | POA: Diagnosis not present

## 2015-11-05 DIAGNOSIS — G4733 Obstructive sleep apnea (adult) (pediatric): Secondary | ICD-10-CM | POA: Insufficient documentation

## 2015-11-05 DIAGNOSIS — E8881 Metabolic syndrome: Secondary | ICD-10-CM

## 2015-11-05 DIAGNOSIS — E663 Overweight: Secondary | ICD-10-CM

## 2015-11-05 DIAGNOSIS — G4730 Sleep apnea, unspecified: Secondary | ICD-10-CM

## 2015-11-05 LAB — TSH: TSH: 1.11 m[IU]/L

## 2015-11-05 MED ORDER — ZOLPIDEM TARTRATE 10 MG PO TABS: 10.0000 mg | ORAL_TABLET | Freq: Every evening | ORAL | Status: DC | PRN

## 2015-11-05 MED ORDER — RANITIDINE HCL 150 MG PO TABS: 150.0000 mg | ORAL_TABLET | Freq: Two times a day (BID) | ORAL | Status: DC

## 2015-11-05 MED ORDER — NAPROXEN 500 MG PO TABS: 500.0000 mg | ORAL_TABLET | Freq: Two times a day (BID) | ORAL | Status: DC

## 2015-11-05 MED ORDER — PREDNISONE 5 MG (21) PO TBPK: 5.0000 mg | ORAL_TABLET | ORAL | Status: DC

## 2015-11-05 NOTE — Assessment & Plan Note (Signed)
The increased risk of cardiovascular disease associated with this diagnosis, and the need to consistently work on lifestyle to change this is discussed. Following  a  heart healthy diet ,commitment to 30 minutes of exercise at least 5 days per week, as well as control of blood sugar and cholesterol , and achieving a healthy weight are all the areas to be addressed .  

## 2015-11-05 NOTE — Assessment & Plan Note (Signed)
Snoring and chronic fatigue, refer for eval for sleep apnea

## 2015-11-05 NOTE — Assessment & Plan Note (Signed)
Controlled, no change in medication DASH diet and commitment to daily physical activity for a minimum of 30 minutes discussed and encouraged, as a part of hypertension management. The importance of attaining a healthy weight is also discussed.  BP/Weight 11/05/2015 06/10/2015 02/03/2015 11/01/2014 09/12/2014 0000000 0000000  Systolic BP 123XX123 123456 123XX123 123456 123XX123 123XX123 123456  Diastolic BP 78 78 80 71 61 69 66  Wt. (Lbs) 184 179 180 - 181 181.8 184  BMI 29.71 28.91 29.07 - 29.23 29.36 28.81

## 2015-11-05 NOTE — Assessment & Plan Note (Signed)
Uncontrolled Sleep hygiene reviewed and written information offered also. Prescription sent for  medication , trial of zolpidem.

## 2015-11-05 NOTE — Patient Instructions (Signed)
F/u in 5.5 month, call if you need me sooner  Excellent blood pressure and cholesterol  Info provided on potassium rich foods  Increase plant based diet  So blood sugar normalizes  Three medications sent for left elbow pain, if persists , you will need ortho to evaluate and treat  You are referred for evaluation of snoring, and new medication sent to help with sleep  Additional lab info will be sent to you "my chart" account  It is important that you exercise regularly at least 30 minutes 5 times a week. If you develop chest pain, have severe difficulty breathing, or feel very tired, stop exercising immediately and seek medical attention    Fasting labs in 5.5 month    Insomnia Insomnia is a sleep disorder that makes it difficult to fall asleep or to stay asleep. Insomnia can cause tiredness (fatigue), low energy, difficulty concentrating, mood swings, and poor performance at work or school.  There are three different ways to classify insomnia:  Difficulty falling asleep.  Difficulty staying asleep.  Waking up too early in the morning. Any type of insomnia can be long-term (chronic) or short-term (acute). Both are common. Short-term insomnia usually lasts for three months or less. Chronic insomnia occurs at least three times a week for longer than three months. CAUSES  Insomnia may be caused by another condition, situation, or substance, such as:  Anxiety.  Certain medicines.  Gastroesophageal reflux disease (GERD) or other gastrointestinal conditions.  Asthma or other breathing conditions.  Restless legs syndrome, sleep apnea, or other sleep disorders.  Chronic pain.  Menopause. This may include hot flashes.  Stroke.  Abuse of alcohol, tobacco, or illegal drugs.  Depression.  Caffeine.   Neurological disorders, such as Alzheimer disease.  An overactive thyroid (hyperthyroidism). The cause of insomnia may not be known. RISK FACTORS Risk factors for  insomnia include:  Gender. Women are more commonly affected than men.  Age. Insomnia is more common as you get older.  Stress. This may involve your professional or personal life.  Income. Insomnia is more common in people with lower income.  Lack of exercise.   Irregular work schedule or night shifts.  Traveling between different time zones. SIGNS AND SYMPTOMS If you have insomnia, trouble falling asleep or trouble staying asleep is the main symptom. This may lead to other symptoms, such as:  Feeling fatigued.  Feeling nervous about going to sleep.  Not feeling rested in the morning.  Having trouble concentrating.  Feeling irritable, anxious, or depressed. TREATMENT  Treatment for insomnia depends on the cause. If your insomnia is caused by an underlying condition, treatment will focus on addressing the condition. Treatment may also include:   Medicines to help you sleep.  Counseling or therapy.  Lifestyle adjustments. HOME CARE INSTRUCTIONS   Take medicines only as directed by your health care provider.  Keep regular sleeping and waking hours. Avoid naps.  Keep a sleep diary to help you and your health care provider figure out what could be causing your insomnia. Include:   When you sleep.  When you wake up during the night.  How well you sleep.   How rested you feel the next day.  Any side effects of medicines you are taking.  What you eat and drink.   Make your bedroom a comfortable place where it is easy to fall asleep:  Put up shades or special blackout curtains to block light from outside.  Use a white noise machine to block noise.  Keep the temperature cool.   Exercise regularly as directed by your health care provider. Avoid exercising right before bedtime.  Use relaxation techniques to manage stress. Ask your health care provider to suggest some techniques that may work well for you. These may include:  Breathing exercises.  Routines  to release muscle tension.  Visualizing peaceful scenes.  Cut back on alcohol, caffeinated beverages, and cigarettes, especially close to bedtime. These can disrupt your sleep.  Do not overeat or eat spicy foods right before bedtime. This can lead to digestive discomfort that can make it hard for you to sleep.  Limit screen use before bedtime. This includes:  Watching TV.  Using your smartphone, tablet, and computer.  Stick to a routine. This can help you fall asleep faster. Try to do a quiet activity, brush your teeth, and go to bed at the same time each night.  Get out of bed if you are still awake after 15 minutes of trying to sleep. Keep the lights down, but try reading or doing a quiet activity. When you feel sleepy, go back to bed.  Make sure that you drive carefully. Avoid driving if you feel very sleepy.  Keep all follow-up appointments as directed by your health care provider. This is important. SEEK MEDICAL CARE IF:   You are tired throughout the day or have trouble in your daily routine due to sleepiness.  You continue to have sleep problems or your sleep problems get worse. SEEK IMMEDIATE MEDICAL CARE IF:   You have serious thoughts about hurting yourself or someone else.   This information is not intended to replace advice given to you by your health care provider. Make sure you discuss any questions you have with your health care provider.   Document Released: 05/27/2000 Document Revised: 02/18/2015 Document Reviewed: 02/28/2014 Elsevier Interactive Patient Education [employer].

## 2015-11-05 NOTE — Assessment & Plan Note (Signed)
Deteriorated. Patient re-educated about  the importance of commitment to a  minimum of 150 minutes of exercise per week.  The importance of healthy food choices with portion control discussed. Encouraged to start a food diary, count calories and to consider  joining a support group. Sample diet sheets offered. Goals set by the patient for the next several months.   Weight /BMI 11/05/2015 06/10/2015 02/03/2015  WEIGHT 184 lb 179 lb 180 lb  HEIGHT 5\' 6"  5\' 6"  5\' 6"   BMI 29.71 kg/m2 28.91 kg/m2 29.07 kg/m2    Current exercise per week 90 min

## 2015-11-05 NOTE — Progress Notes (Signed)
Subjective:    Patient ID: Lori Sims, female    DOB: 15-Oct-1960, 55 y.o.   MRN: [id number]  HPI   Lori Sims     MRN: [id number]      DOB: 1960/08/19   HPI Lori Sims is here for follow up and re-evaluation of chronic medical conditions, medication management and review of any available recent lab and radiology data.  Preventive health is updated, specifically  Cancer screening and Immunization.   Questions or concerns regarding consultations or procedures which the PT has had in the interim are  addressed. The PT denies any adverse effects to medication, but states that trazodone of no benefit so she discontinued this C/o snoring, excessive daytime sleepiness. Does not "feel well" has awakened feeling as though unable to breathe once. C/o 3 month h/o left elbow pain rated at an 8, Has had injection into right  elbow with success. C/o right knee pain, following exercise , rated at a 3.   ROS Denies recent fever or chills. Denies sinus pressure, nasal congestion, ear pain or sore throat. Denies chest congestion, productive cough or wheezing. Denies chest pains, palpitations and leg swelling Denies abdominal pain, nausea, vomiting,diarrhea or constipation.   Denies dysuria, frequency, hesitancy or incontinence.  Denies headaches, seizures, numbness, or tingling.  Denies skin break down or rash.   PE  BP 126/78 mmHg  Pulse 76  Resp 18  Ht 5\' 6"  (1.676 m)  Wt 184 lb (83.462 kg)  BMI 29.71 kg/m2  SpO2 100%  Patient alert and oriented and in no cardiopulmonary distress.  HEENT: No facial asymmetry, EOMI,   oropharynx pink and moist.  Neck supple no JVD, no mass.  Chest: Clear to auscultation bilaterally.  CVS: S1, S2 no murmurs, no S3.Regular rate.  ABD: Soft non tender.   Ext: No edema  MS: Adequate ROM spine, shoulders, hips and knees.Tender over medial aspect of left elbow  Skin: Intact, no ulcerations or rash noted.  Psych: Good eye contact, normal  affect. Memory intact not anxious or depressed appearing.  CNS: CN 2-12 intact, power,  normal throughout.no focal deficits noted.   Assessment & Plan   Essential hypertension Controlled, no change in medication DASH diet and commitment to daily physical activity for a minimum of 30 minutes discussed and encouraged, as a part of hypertension management. The importance of attaining a healthy weight is also discussed.  BP/Weight 11/05/2015 06/10/2015 02/03/2015 11/01/2014 09/12/2014 0000000 0000000  Systolic BP 123XX123 123456 123XX123 123456 123XX123 123XX123 123456  Diastolic BP 78 78 80 71 61 69 66  Wt. (Lbs) 184 179 180 - 181 181.8 184  BMI 29.71 28.91 29.07 - 29.23 29.36 28.81        Insomnia Uncontrolled Sleep hygiene reviewed and written information offered also. Prescription sent for  medication , trial of zolpidem.   Sleep disorder breathing Snoring and chronic fatigue, refer for eval for sleep apnea  Overweight (BMI 25.0-29.9) Deteriorated. Patient re-educated about  the importance of commitment to a  minimum of 150 minutes of exercise per week.  The importance of healthy food choices with portion control discussed. Encouraged to start a food diary, count calories and to consider  joining a support group. Sample diet sheets offered. Goals set by the patient for the next several months.   Weight /BMI 11/05/2015 06/10/2015 02/03/2015  WEIGHT 184 lb 179 lb 180 lb  HEIGHT 5\' 6"  5\' 6"  5\' 6"   BMI 29.71 kg/m2 28.91 kg/m2 29.07 kg/m2    Current  exercise per week 90 min   Metabolic syndrome X The increased risk of cardiovascular disease associated with this diagnosis, and the need to consistently work on lifestyle to change this is discussed. Following  a  heart healthy diet ,commitment to 30 minutes of exercise at least 5 days per week, as well as control of blood sugar and cholesterol , and achieving a healthy weight are all the areas to be addressed .   Lateral epicondylitis of left elbow 3  month history, pain rated at an 8. Oral anti inflammatories initially per pt preference, if unsuccessful will need ortho input       Review of Systems     Objective:   Physical Exam        Assessment & Plan:

## 2015-11-05 NOTE — Assessment & Plan Note (Signed)
3 month history, pain rated at an 8. Oral anti inflammatories initially per pt preference, if unsuccessful will need ortho input

## 2015-11-06 ENCOUNTER — Encounter: Payer: Self-pay | Admitting: Family Medicine

## 2015-11-06 LAB — VITAMIN D 25 HYDROXY (VIT D DEFICIENCY, FRACTURES): Vit D, 25-Hydroxy: 38 ng/mL (ref 30–100)

## 2015-11-29 ENCOUNTER — Emergency Department (HOSPITAL_COMMUNITY): Payer: BLUE CROSS/BLUE SHIELD

## 2015-11-29 ENCOUNTER — Encounter (HOSPITAL_COMMUNITY): Payer: Self-pay | Admitting: Emergency Medicine

## 2015-11-29 ENCOUNTER — Emergency Department (HOSPITAL_COMMUNITY)
Admission: EM | Admit: 2015-11-29 | Discharge: 2015-11-29 | Disposition: A | Discharge status: Home / Self Care | Payer: BLUE CROSS/BLUE SHIELD | Attending: Emergency Medicine | Admitting: Emergency Medicine

## 2015-11-29 DIAGNOSIS — M1711 Unilateral primary osteoarthritis, right knee: Secondary | ICD-10-CM | POA: Diagnosis not present

## 2015-11-29 DIAGNOSIS — M25461 Effusion, right knee: Secondary | ICD-10-CM

## 2015-11-29 DIAGNOSIS — I10 Essential (primary) hypertension: Secondary | ICD-10-CM | POA: Insufficient documentation

## 2015-11-29 DIAGNOSIS — Z79899 Other long term (current) drug therapy: Secondary | ICD-10-CM | POA: Diagnosis not present

## 2015-11-29 DIAGNOSIS — M25561 Pain in right knee: Secondary | ICD-10-CM | POA: Diagnosis present

## 2015-11-29 MED ORDER — ONDANSETRON HCL 4 MG PO TABS
4.0000 mg | ORAL_TABLET | Freq: Once | ORAL | Status: AC
  Administered 2015-11-29: 4 mg via ORAL
  Filled 2015-11-29: qty 1

## 2015-11-29 MED ORDER — DICLOFENAC SODIUM 75 MG PO TBEC: 75.0000 mg | DELAYED_RELEASE_TABLET | Freq: Two times a day (BID) | ORAL | Status: DC

## 2015-11-29 MED ORDER — HYDROCODONE-ACETAMINOPHEN 5-325 MG PO TABS
2.0000 | ORAL_TABLET | Freq: Once | ORAL | Status: AC
  Administered 2015-11-29: 2 via ORAL
  Filled 2015-11-29: qty 2

## 2015-11-29 MED ORDER — DEXAMETHASONE SODIUM PHOSPHATE 4 MG/ML IJ SOLN
8.0000 mg | Freq: Once | INTRAMUSCULAR | Status: AC
  Administered 2015-11-29: 8 mg via INTRAMUSCULAR
  Filled 2015-11-29: qty 2

## 2015-11-29 MED ORDER — KETOROLAC TROMETHAMINE 10 MG PO TABS
10.0000 mg | ORAL_TABLET | Freq: Once | ORAL | Status: AC
  Administered 2015-11-29: 10 mg via ORAL
  Filled 2015-11-29: qty 1

## 2015-11-29 MED ORDER — HYDROCODONE-ACETAMINOPHEN 5-325 MG PO TABS: 1.0000 | ORAL_TABLET | ORAL | Status: DC | PRN

## 2015-11-29 MED ORDER — DEXAMETHASONE 4 MG PO TABS: 4.0000 mg | ORAL_TABLET | Freq: Two times a day (BID) | ORAL | Status: DC

## 2015-11-29 NOTE — ED Provider Notes (Signed)
CSN: [id number]     Arrival date & time 11/29/15  1112 History  By signing my name below, I, Lori Sims, attest that this documentation has been prepared under the direction and in the presence of [employer] .  Electronically Signed: Tedra Coupe. Sheppard Coil, ED Scribe. 11/29/2015. 12:43 PM.   Chief Complaint  Patient presents with  . Knee Pain    Patient is a 55 y.o. female presenting with knee pain. The history is provided by the patient. No language interpreter was used.  Knee Pain Location:  Knee Injury: no   Knee location:  R knee Pain details:    Radiates to:  Does not radiate   Severity:  Moderate   Onset quality:  Gradual   Timing:  Constant   Progression:  Worsening Chronicity:  Recurrent Dislocation: no   Foreign body present:  No foreign bodies Prior injury to area:  No Relieved by:  NSAIDs and arthritis medications Associated symptoms: swelling   Associated symptoms: no fever    HPI Comments: Lori Sims is a 55 y.o. female who presents to the Emergency Department complaining of gradual onset, constant, worsening right knee pain. Pt says she was diagnosed with arthritis of the right knee in 2011 by Dr. Aline Sims, Lori Sims. Pt says she visited Dr. Moshe Sims, her Lori Sims, on 11/05/15 where she received Naprosyn and Prednisone for pain. She notes that medications initially relieved pain, but once medicine was completed pain returned and has gotten worse. She denies any recent injury or trauma of right knee. Pt has associated swelling of right knee. Pain is moderately relieved with use of a cane. Denies any recent knee procedures. Denies any warmth of area or fever.   Past Medical History  Diagnosis Date  . Hypertension   . Hypercholesterolemia   . GERD (gastroesophageal reflux disease)    Past Surgical History  Procedure Laterality Date  . Colonoscopy  2010    Dr. Laural Golden: normal except external hemorrhoids  . Fibroid tumor right breast    . Fibroid tumors  left breast    . Ovarian cyst removal    . Ovarian cyst drainage    . Cesarean section    . Colonoscopy N/A 09/12/2014    Procedure: COLONOSCOPY;  Surgeon: Danie Binder, MD;  Location: [facility];  Service: Endoscopy;  Laterality: N/A;  1030   . Esophagogastroduodenoscopy N/A 09/12/2014    Procedure: ESOPHAGOGASTRODUODENOSCOPY (EGD);  Surgeon: Danie Binder, MD;  Location: [facility];  Service: Endoscopy;  Laterality: N/A;  . Esophageal dilation N/A 09/12/2014    Procedure: ESOPHAGEAL DILATION;  Surgeon: Danie Binder, MD;  Location: [facility];  Service: Endoscopy;  Laterality: N/A;   Family History  Problem Relation Age of Onset  . Colon cancer Neg Hx   . Diabetes Mother   . Hypertension Father    Social History  Substance Use Topics  . Smoking status: Never Smoker   . Smokeless tobacco: None  . Alcohol Use: 0.0 oz/week    0 Standard drinks or equivalent per week     Comment: rare wine   OB History    No data available     Review of Systems  Constitutional: Negative for fever.  Musculoskeletal: Positive for myalgias, joint swelling and arthralgias.  All other systems reviewed and are negative.  Allergies  Penicillins and Tramadol  Home Medications   Prior to Admission medications   Medication Sig Start Date End Date Taking? Authorizing Provider  atorvastatin (LIPITOR) 20  MG tablet Take 1 tablet (20 mg total) by mouth daily. 06/12/15   Fayrene Helper, MD  Multiple Vitamin (MULTIVITAMIN) tablet Take 1 tablet by mouth daily.    Historical Provider, MD  naproxen (NAPROSYN) 500 MG tablet Take 1 tablet (500 mg total) by mouth 2 (two) times daily with a meal. 11/05/15   Fayrene Helper, MD  predniSONE (STERAPRED UNI-PAK 21 TAB) 5 MG (21) TBPK tablet Take 1 tablet (5 mg total) by mouth as directed. Use as directed 11/05/15   Fayrene Helper, MD  ranitidine (ZANTAC) 150 MG tablet Take 1 tablet (150 mg total) by mouth 2 (two) times daily. 11/05/15   Fayrene Helper, MD  triamterene-hydrochlorothiazide (MAXZIDE-25) 37.5-25 MG tablet TAKE ONE TABLET BY MOUTH ONCE DAILY 06/10/15   Fayrene Helper, MD  zolpidem (AMBIEN) 10 MG tablet Take 1 tablet (10 mg total) by mouth at bedtime as needed for sleep. 11/05/15 12/05/15  Fayrene Helper, MD   BP 120/56 mmHg  Pulse 60  Temp(Src) 97.8 F (36.6 C) (Oral)  Resp 18  Ht 5\' 6"  (1.676 m)  Wt 187 lb (84.823 kg)  BMI 30.20 kg/m2  SpO2 100% Physical Exam  Constitutional: She is oriented to person, place, and time. She appears well-developed and well-nourished.  HENT:  Head: Normocephalic and atraumatic.  Eyes: EOM are normal. Pupils are equal, round, and reactive to light.  Neck: Normal range of motion.  Cardiovascular: Normal rate, regular rhythm, normal heart sounds and intact distal pulses.   Dorsalis pedis 2+ bilaterally. Capillary refill less than 2 seconds.   Pulmonary/Chest: Effort normal and breath sounds normal. No respiratory distress. She has no wheezes. She has no rales.  Abdominal: Soft. Bowel sounds are normal. She exhibits no distension. There is no tenderness. There is no rebound and no guarding.  Musculoskeletal:  Small effusion of the right knee. No quadricep deformity. No deformity of patella tendon. No posterior mass. Crepitus with limited range of motion.  Neurological: She is alert and oriented to person, place, and time.  Skin: Skin is warm and dry. No rash noted.  No temperature changes of lower extremities  Psychiatric: She has a normal mood and affect. Judgment normal.  Nursing note and vitals reviewed.  ED Course  Procedures (including critical care time) DIAGNOSTIC STUDIES: Oxygen Saturation is 100% on RA, normal by my interpretation.    COORDINATION OF CARE: 12:35 PM-Discussed treatment plan which includes order of IM Decadron, Norco, Zofran, and Toradol with pt at bedside and pt agreed to plan.   Imaging Review Dg Knee Complete 4 Views Right  11/29/2015   CLINICAL DATA:  Pain and difficulty walking since 11/05/2015. EXAM: RIGHT KNEE - COMPLETE 4+ VIEW COMPARISON:  Knee MRI, 10/07/2009 FINDINGS: No fracture.  No bone lesion. Knee joint is normally spaced and aligned. There are minor marginal osteophytes. No other arthropathic change. Small joint effusion noted on the lateral view in the suprapatellar joint capsule. Soft tissues are unremarkable. IMPRESSION: 1. No fracture or bone lesion. 2. Minimal degenerative change. 3. Small joint effusion, nonspecific. Electronically Signed   By: Lajean Manes M.D.   On: 11/29/2015 11:59   I have personally reviewed and evaluated these images and lab results as part of my medical decision-making.   MDM  Vital signs stable. Xray of the right knee reveals DJD, and joint effusion. No neurovascular deficit noted. Rx for decadron, norco, and diclofenac given to the patient. Pt referred to orthopedics for additional management.  Final diagnoses:  Knee effusion, right  Primary osteoarthritis of right knee    *I have reviewed nursing notes, vital signs, and all appropriate lab and imaging results for this patient.  **I personally performed the services described in this documentation, which was scribed in my presence. The recorded information has been reviewed and is accurate.Lily Kocher, PA-C 11/30/15 [place], PA-C 11/30/15 1228  Nat Christen, MD 11/30/15 [phone]

## 2015-11-29 NOTE — ED Notes (Signed)
Pt states she has been having right knee pain "for a while" with no injury.  Has seen her pcp, but getting no better.

## 2015-11-29 NOTE — Discharge Instructions (Signed)
The x-ray of your right knee suggest degenerative changes, and a knee effusion. There no fractures or dislocations or other issues. Please use the Ace wrap on until you're seen by the orthopedic specialist. Please use Decadron and diclofenac 2 times daily with food. May use Norco for more severe pain.This medication may cause drowsiness. Please do not drink, drive, or participate in activity that requires concentration while taking this medication. Please see Dr. Moshe Cipro for assistance with pain management of this issue. Knee Effusion Knee effusion means that you have extra fluid in your knee. This can cause pain. Your knee may be more difficult to bend and move. HOME CARE  Use crutches as told by your doctor.  Wear a knee brace as told by your doctor.  Apply ice to the swollen area:  Put ice in a plastic bag.  Place a towel between your skin and the bag.  Leave the ice on for 20 minutes, 2-3 times per day.  Keep your knee raised (elevated) when you are sitting or lying down.  Take medicines only as told by your doctor.  Do any rehabilitation or strengthening exercises as told by your doctor.  Rest your knee as told by your doctor. You may start doing your normal activities again when your doctor says it is okay.  Keep all follow-up visits as told by your doctor. This is important. GET HELP IF:   You continue to have pain in your knee. GET HELP RIGHT AWAY IF:  You have increased swelling or redness of your knee.  You have severe pain in your knee.  You have a fever.   This information is not intended to replace advice given to you by your health care provider. Make sure you discuss any questions you have with your health care provider.   Document Released: 07/02/2010 Document Revised: 06/20/2014 Document Reviewed: 01/13/2014 Elsevier Interactive Patient Education 2016 Elsevier Inc.  Osteoarthritis Osteoarthritis is a disease that causes soreness and inflammation of a  joint. It occurs when the cartilage at the affected joint wears down. Cartilage acts as a cushion, covering the ends of bones where they meet to form a joint. Osteoarthritis is the most common form of arthritis. It often occurs in older people. The joints affected most often by this condition include those in the:  Ends of the fingers.  Thumbs.  Neck.  Lower back.  Knees.  Hips. CAUSES  Over time, the cartilage that covers the ends of bones begins to wear away. This causes bone to rub on bone, producing pain and stiffness in the affected joints.  RISK FACTORS Certain factors can increase your chances of having osteoarthritis, including:  Older age.  Excessive body weight.  Overuse of joints.  Previous joint injury. SIGNS AND SYMPTOMS   Pain, swelling, and stiffness in the joint.  Over time, the joint may lose its normal shape.  Small deposits of bone (osteophytes) may grow on the edges of the joint.  Bits of bone or cartilage can break off and float inside the joint space. This may cause more pain and damage. DIAGNOSIS  Your health care provider will do a physical exam and ask about your symptoms. Various tests may be ordered, such as:  X-rays of the affected joint.  Blood tests to rule out other types of arthritis. Additional tests may be used to diagnose your condition. TREATMENT  Goals of treatment are to control pain and improve joint function. Treatment plans may include:  A prescribed exercise program that  allows for rest and joint relief.  A weight control plan.  Pain relief techniques, such as:  Properly applied heat and cold.  Electric pulses delivered to nerve endings under the skin (transcutaneous electrical nerve stimulation [TENS]).  Massage.  Certain nutritional supplements.  Medicines to control pain, such as:  Acetaminophen.  Nonsteroidal anti-inflammatory drugs (NSAIDs), such as naproxen.  Narcotic or central-acting agents, such as  tramadol.  Corticosteroids. These can be given orally or as an injection.  Surgery to reposition the bones and relieve pain (osteotomy) or to remove loose pieces of bone and cartilage. Joint replacement may be needed in advanced states of osteoarthritis. HOME CARE INSTRUCTIONS   Take medicines only as directed by your health care provider.  Maintain a healthy weight. Follow your health care provider's instructions for weight control. This may include dietary instructions.  Exercise as directed. Your health care provider can recommend specific types of exercise. These may include:  Strengthening exercises. These are done to strengthen the muscles that support joints affected by arthritis. They can be performed with weights or with exercise bands to add resistance.  Aerobic activities. These are exercises, such as brisk walking or low-impact aerobics, that get your heart pumping.  Range-of-motion activities. These keep your joints limber.  Balance and agility exercises. These help you maintain daily living skills.  Rest your affected joints as directed by your health care provider.  Keep all follow-up visits as directed by your health care provider. SEEK MEDICAL CARE IF:   Your skin turns red.  You develop a rash in addition to your joint pain.  You have worsening joint pain.  You have a fever along with joint or muscle aches. SEEK IMMEDIATE MEDICAL CARE IF:  You have a significant loss of weight or appetite.  You have night sweats. [place] of Arthritis and Musculoskeletal and Skin Diseases: www.niams.SouthExposed.es  [employer] on Aging: http://kim-miller.com/  American College of Rheumatology: www.rheumatology.org   This information is not intended to replace advice given to you by your health care provider. Make sure you discuss any questions you have with your health care provider.   Document Released: 05/30/2005 Document Revised:  06/20/2014 Document Reviewed: 02/04/2013 Elsevier Interactive Patient Education [employer].

## 2016-01-04 ENCOUNTER — Other Ambulatory Visit (HOSPITAL_COMMUNITY): Payer: Self-pay | Admitting: Respiratory Therapy

## 2016-01-04 DIAGNOSIS — G4733 Obstructive sleep apnea (adult) (pediatric): Secondary | ICD-10-CM

## 2016-01-13 ENCOUNTER — Ambulatory Visit (INDEPENDENT_AMBULATORY_CARE_PROVIDER_SITE_OTHER): Payer: BLUE CROSS/BLUE SHIELD | Admitting: Orthopedic Surgery

## 2016-01-13 DIAGNOSIS — M25561 Pain in right knee: Secondary | ICD-10-CM

## 2016-01-13 DIAGNOSIS — S83241A Other tear of medial meniscus, current injury, right knee, initial encounter: Secondary | ICD-10-CM

## 2016-01-13 NOTE — Patient Instructions (Addendum)
Call l to arrange PT  Joint Injection  Care After  Refer to this sheet in the next few days. These instructions provide you with information on caring for yourself after you have had a joint injection. Your caregiver also may give you more specific instructions. Your treatment has been planned according to current medical practices, but problems sometimes occur. Call your caregiver if you have any problems or questions after your procedure.  After any type of joint injection, it is not uncommon to experience:  Soreness, swelling, or bruising around the injection site.  Mild numbness, tingling, or weakness around the injection site caused by the numbing medicine used before or with the injection. It also is possible to experience the following effects associated with the specific agent after injection:  Iodine-based contrast agents:  Allergic reaction (itching, hives, widespread redness, and swelling beyond the injection site).  Corticosteroids (These effects are rare.):  Allergic reaction.  Increased blood sugar levels (If you have diabetes and you notice that your blood sugar levels have increased, notify your caregiver).  Increased blood pressure levels.  Mood swings.  Hyaluronic acid in the use of viscosupplementation.  Temporary heat or redness.  Temporary rash and itching.  Increased fluid accumulation in the injected joint. These effects all should resolve within a day after your procedure.  HOME CARE INSTRUCTIONS  Limit yourself to light activity the day of your procedure. Avoid lifting heavy objects, bending, stooping, or twisting.  Take prescription or over-the-counter pain medication as directed by your caregiver.  You may apply ice to your injection site to reduce pain and swelling the day of your procedure. Ice may be applied 3-4 times:  Put ice in a plastic bag.  Place a towel between your skin and the bag.  Leave the ice on for no longer than 15-20 minutes each time. SEEK  IMMEDIATE MEDICAL CARE IF:  Pain and swelling get worse rather than better or extend beyond the injection site.  Numbness does not go away.  Blood or fluid continues to leak from the injection site.  You have chest pain.  You have swelling of your face or tongue.  You have trouble breathing or you become dizzy.  You develop a fever, chills, or severe tenderness at the injection site that last longer than 1 day. MAKE SURE YOU:  Understand these instructions.  Watch your condition.  Get help right away if you are not doing well or if you get worse. Document Released: 02/10/2011 Document Revised: 08/22/2011 Document Reviewed: 02/10/2011  [facility] Patient Information 2014 [place].

## 2016-01-18 NOTE — Progress Notes (Signed)
Chief Complaint  Patient presents with  . Knee Pain    ER follow up on right knee and left elbow pain.   HPI 55 year old presents for history of right knee pain. 3 months of symptoms no injury. 8 out of 10 pain. Describes pain as sharp stabbing with symptoms of catching stiffness giving way tingling in the front of the knee. Imaging studies include plain films and prior treatment includes hydrocodone and prednisone also had diclofenac and brief course of steroids. Seems to improve with heat and worse with cold. Review of Systems  Constitutional: Positive for malaise/fatigue.  HENT: Positive for congestion.   Gastrointestinal: Negative.   Genitourinary: Negative.   Musculoskeletal: Positive for joint pain and myalgias.  Endo/Heme/Allergies: Positive for environmental allergies.    Past Medical History:  Diagnosis Date  . GERD (gastroesophageal reflux disease)   . Hypercholesterolemia   . Hypertension     Past Surgical History:  Procedure Laterality Date  . CESAREAN SECTION    . COLONOSCOPY  2010   Dr. Laural Golden: normal except external hemorrhoids  . COLONOSCOPY N/A 09/12/2014   Procedure: COLONOSCOPY;  Surgeon: Danie Binder, MD;  Location: [facility];  Service: Endoscopy;  Laterality: N/A;  1030   . ESOPHAGEAL DILATION N/A 09/12/2014   Procedure: ESOPHAGEAL DILATION;  Surgeon: Danie Binder, MD;  Location: [facility];  Service: Endoscopy;  Laterality: N/A;  . ESOPHAGOGASTRODUODENOSCOPY N/A 09/12/2014   Procedure: ESOPHAGOGASTRODUODENOSCOPY (EGD);  Surgeon: Danie Binder, MD;  Location: [facility];  Service: Endoscopy;  Laterality: N/A;  . fibroid tumor right breast    . fibroid tumors left breast    . OVARIAN CYST DRAINAGE    . OVARIAN CYST REMOVAL     Family History  Problem Relation Age of Onset  . Colon cancer Neg Hx   . Diabetes Mother   . Hypertension Father    Social History  Substance Use Topics  . Smoking status: Never Smoker  . Smokeless tobacco: Not on  file  . Alcohol use 0.0 oz/week     Comment: rare wine    Current Outpatient Prescriptions:  .  atorvastatin (LIPITOR) 20 MG tablet, Take 1 tablet (20 mg total) by mouth daily., Disp: 90 tablet, Rfl: 3 .  dexamethasone (DECADRON) 4 MG tablet, Take 1 tablet (4 mg total) by mouth 2 (two) times daily with a meal., Disp: 12 tablet, Rfl: 0 .  diclofenac (VOLTAREN) 75 MG EC tablet, Take 1 tablet (75 mg total) by mouth 2 (two) times daily., Disp: 14 tablet, Rfl: 0 .  HYDROcodone-acetaminophen (NORCO/VICODIN) 5-325 MG tablet, Take 1 tablet by mouth every 4 (four) hours as needed., Disp: 15 tablet, Rfl: 0 .  Multiple Vitamin (MULTIVITAMIN) tablet, Take 1 tablet by mouth daily., Disp: , Rfl:  .  naproxen (NAPROSYN) 500 MG tablet, Take 1 tablet (500 mg total) by mouth 2 (two) times daily with a meal., Disp: 14 tablet, Rfl: 0 .  predniSONE (STERAPRED UNI-PAK 21 TAB) 5 MG (21) TBPK tablet, Take 1 tablet (5 mg total) by mouth as directed. Use as directed, Disp: 21 tablet, Rfl: 0 .  ranitidine (ZANTAC) 150 MG tablet, Take 1 tablet (150 mg total) by mouth 2 (two) times daily., Disp: 28 tablet, Rfl: 0 .  triamterene-hydrochlorothiazide (MAXZIDE-25) 37.5-25 MG tablet, TAKE ONE TABLET BY MOUTH ONCE DAILY, Disp: 90 tablet, Rfl: 1 .  zolpidem (AMBIEN) 10 MG tablet, Take 1 tablet (10 mg total) by mouth at bedtime as needed for sleep., Disp: 30  tablet, Rfl: 4  Ht (P) 5\' 6"  (1.676 m)   Physical Exam  Constitutional: She is oriented to person, place, and time. She appears well-developed and well-nourished. No distress.  Cardiovascular: Normal rate and intact distal pulses.   Neurological: She is alert and oriented to person, place, and time. She has normal reflexes. She exhibits normal muscle tone. Coordination normal.  Skin: Skin is warm and dry. No rash noted. She is not diaphoretic. No erythema. No pallor.  Psychiatric: She has a normal mood and affect. Her behavior is normal. Judgment and thought content normal.     Ortho Exam Left knee exam normal alignment range of motion stability strength skin pulses intact normal sensation  Right knee shows medial joint line tenderness some lateral joint tenderness full range of motion ligaments are stable strength is normal skin is intact pulses are normal and sensation is intact as well.  Gait shows no significant limp  ASSESSMENT: My personal interpretation of the images:  X-ray shows no significant degenerative arthritis  X-ray report   FINDINGS: No fracture.  No bone lesion.  Knee joint is normally spaced and aligned. There are minor marginal osteophytes. No other arthropathic change.  Small joint effusion noted on the lateral view in the suprapatellar joint capsule.  Soft tissues are unremarkable.  IMPRESSION: 1. No fracture or bone lesion. 2. Minimal degenerative change. 3. Small joint effusion, nonspecific.   Electronically Signed   By: Lajean Manes M.D.   On: 11/29/2015 11:59    PLAN Recommend injection follow-up in 4 weeks  Procedure note right knee injection verbal consent was obtained to inject right knee joint  Timeout was completed to confirm the site of injection  The medications used were 40 mg of Depo-Medrol and 1% lidocaine 3 cc  Anesthesia was provided by ethyl chloride and the skin was prepped with alcohol.  After cleaning the skin with alcohol a 20-gauge needle was used to inject the right knee joint. There were no complications. A sterile bandage was applied.   Arther Abbott, MD 01/18/2016 8:52 AM

## 2016-01-21 ENCOUNTER — Ambulatory Visit: Payer: BLUE CROSS/BLUE SHIELD | Attending: Neurology | Admitting: Neurology

## 2016-01-21 DIAGNOSIS — G4733 Obstructive sleep apnea (adult) (pediatric): Secondary | ICD-10-CM | POA: Diagnosis not present

## 2016-01-23 NOTE — Procedures (Signed)
[place] A. Merlene Laughter, MD     [email or web address]             NOCTURNAL POLYSOMNOGRAPHY   LOCATION: [facility]  Patient Name: Lori Sims, Lori Sims Date: 01/21/2016 Gender: Female D.O.B: 20-Jul-1960 Age (years): 6 Referring Provider: Not Available Height (inches): 66 Interpreting Physician: Phillips Odor MD, ABSM Weight (lbs): 184 RPSGT: Peak, Robert BMI: 30 MRN: [id number] Neck Size: 15.00 CLINICAL INFORMATION Sleep Study Type: NPSG Indication for sleep study: N/A Epworth Sleepiness Score: 7 SLEEP STUDY TECHNIQUE As per the AASM Manual for the Scoring of Sleep and Associated Events v2.3 (April 2016) with a hypopnea requiring 4% desaturations. The channels recorded and monitored were frontal, central and occipital EEG, electrooculogram (EOG), submentalis EMG (chin), nasal and oral airflow, thoracic and abdominal wall motion, anterior tibialis EMG, snore microphone, electrocardiogram, and pulse oximetry. MEDICATIONS Patient's medications include: N/A. Medications self-administered by patient during sleep study : No sleep medicine administered.  Current Outpatient Prescriptions:  .  atorvastatin (LIPITOR) 20 MG tablet, Take 1 tablet (20 mg total) by mouth daily., Disp: 90 tablet, Rfl: 3 .  dexamethasone (DECADRON) 4 MG tablet, Take 1 tablet (4 mg total) by mouth 2 (two) times daily with a meal., Disp: 12 tablet, Rfl: 0 .  diclofenac (VOLTAREN) 75 MG EC tablet, Take 1 tablet (75 mg total) by mouth 2 (two) times daily., Disp: 14 tablet, Rfl: 0 .  HYDROcodone-acetaminophen (NORCO/VICODIN) 5-325 MG tablet, Take 1 tablet by mouth every 4 (four) hours as needed., Disp: 15 tablet, Rfl: 0 .  Multiple Vitamin (MULTIVITAMIN) tablet, Take 1 tablet by mouth daily., Disp: , Rfl:  .  naproxen (NAPROSYN) 500 MG tablet, Take 1 tablet (500 mg total) by mouth 2 (two) times daily with a meal., Disp: 14 tablet, Rfl: 0 .  predniSONE (STERAPRED UNI-PAK 21 TAB) 5 MG (21) TBPK tablet,  Take 1 tablet (5 mg total) by mouth as directed. Use as directed, Disp: 21 tablet, Rfl: 0 .  ranitidine (ZANTAC) 150 MG tablet, Take 1 tablet (150 mg total) by mouth 2 (two) times daily., Disp: 28 tablet, Rfl: 0 .  triamterene-hydrochlorothiazide (MAXZIDE-25) 37.5-25 MG tablet, TAKE ONE TABLET BY MOUTH ONCE DAILY, Disp: 90 tablet, Rfl: 1 .  zolpidem (AMBIEN) 10 MG tablet, Take 1 tablet (10 mg total) by mouth at bedtime as needed for sleep., Disp: 30 tablet, Rfl: 4  SLEEP ARCHITECTURE The study was initiated at 9:33:30 PM and ended at 5:16:25 AM. Sleep onset time was 23.5 minutes and the sleep efficiency was 90.9%. The total sleep time was 420.9 minutes. Stage REM latency was 87.5 minutes. The patient spent 4.75% of the night in stage N1 sleep, 72.47% in stage N2 sleep, 4.40% in stage N3 and 18.38% in REM. Alpha intrusion was absent. Supine sleep was 19.99%. RESPIRATORY PARAMETERS The overall apnea/hypopnea index (AHI) was 10 per hour. There were 49 total apneas, including 7 obstructive, 42 central and 0 mixed apneas. There were 19 hypopneas and 24 RERAs. The AHI during Stage REM sleep was 15.5 per hour. AHI while supine was 16.4 per hour. The mean oxygen saturation was 96.63%. The minimum SpO2 during sleep was 87.00%. Moderate snoring was noted during this study. CARDIAC DATA The 2 lead EKG demonstrated sinus rhythm. The mean heart rate was 75.40 beats per minute. Other EKG findings include: None. LEG MOVEMENT DATA The total PLMS were 0 with a resulting PLMS index of 0.00. Associated arousal with leg movement index was 0.0.   IMPRESSIONS - Mild obstructive sleep apnea.  Consider trial of AutoPAP 8-12.   Delano Metz, MD Diplomate, American Board of Sleep Medicine.

## 2016-02-10 ENCOUNTER — Ambulatory Visit: Payer: BLUE CROSS/BLUE SHIELD | Admitting: Orthopedic Surgery

## 2016-03-17 ENCOUNTER — Other Ambulatory Visit: Payer: Self-pay | Admitting: Family Medicine

## 2016-03-17 DIAGNOSIS — Z1231 Encounter for screening mammogram for malignant neoplasm of breast: Secondary | ICD-10-CM

## 2016-03-21 ENCOUNTER — Encounter (HOSPITAL_COMMUNITY): Payer: Self-pay | Admitting: Radiology

## 2016-03-21 ENCOUNTER — Ambulatory Visit (HOSPITAL_COMMUNITY)
Admission: RE | Admit: 2016-03-21 | Discharge: 2016-03-21 | Disposition: A | Discharge status: Home / Self Care | Payer: BLUE CROSS/BLUE SHIELD | Source: Ambulatory Visit | Attending: Family Medicine | Admitting: Family Medicine

## 2016-03-21 DIAGNOSIS — Z1231 Encounter for screening mammogram for malignant neoplasm of breast: Secondary | ICD-10-CM | POA: Insufficient documentation

## 2016-04-19 ENCOUNTER — Ambulatory Visit: Payer: BLUE CROSS/BLUE SHIELD | Admitting: Family Medicine

## 2016-04-20 ENCOUNTER — Other Ambulatory Visit: Payer: Self-pay | Admitting: Family Medicine

## 2016-04-20 DIAGNOSIS — G4700 Insomnia, unspecified: Secondary | ICD-10-CM

## 2016-05-10 ENCOUNTER — Telehealth: Payer: Self-pay

## 2016-05-10 DIAGNOSIS — R7301 Impaired fasting glucose: Secondary | ICD-10-CM

## 2016-05-10 DIAGNOSIS — D649 Anemia, unspecified: Secondary | ICD-10-CM

## 2016-05-10 DIAGNOSIS — I10 Essential (primary) hypertension: Secondary | ICD-10-CM

## 2016-05-10 NOTE — Telephone Encounter (Signed)
Expired labs reordered  

## 2016-05-11 ENCOUNTER — Other Ambulatory Visit: Payer: Self-pay | Admitting: Family Medicine

## 2016-05-11 DIAGNOSIS — G4700 Insomnia, unspecified: Secondary | ICD-10-CM

## 2016-05-16 LAB — COMPREHENSIVE METABOLIC PANEL
ALBUMIN: 4 g/dL (ref 3.6–5.1)
ALT: 11 U/L (ref 6–29)
AST: 18 U/L (ref 10–35)
Alkaline Phosphatase: 75 U/L (ref 33–130)
BILIRUBIN TOTAL: 0.4 mg/dL (ref 0.2–1.2)
BUN: 13 mg/dL (ref 7–25)
CO2: 32 mmol/L — AB (ref 20–31)
CREATININE: 1.04 mg/dL (ref 0.50–1.05)
Calcium: 9.2 mg/dL (ref 8.6–10.4)
Chloride: 101 mmol/L (ref 98–110)
Glucose, Bld: 90 mg/dL (ref 65–99)
Potassium: 3.2 mmol/L — ABNORMAL LOW (ref 3.5–5.3)
SODIUM: 142 mmol/L (ref 135–146)
TOTAL PROTEIN: 6.8 g/dL (ref 6.1–8.1)

## 2016-05-16 LAB — CBC
HCT: 34 % — ABNORMAL LOW (ref 35.0–45.0)
HEMOGLOBIN: 11.1 g/dL — AB (ref 11.7–15.5)
MCH: 29.9 pg (ref 27.0–33.0)
MCHC: 32.6 g/dL (ref 32.0–36.0)
MCV: 91.6 fL (ref 80.0–100.0)
MPV: 9.9 fL (ref 7.5–12.5)
Platelets: 310 10*3/uL (ref 140–400)
RBC: 3.71 MIL/uL — AB (ref 3.80–5.10)
RDW: 13 % (ref 11.0–15.0)
WBC: 8.4 10*3/uL (ref 3.8–10.8)

## 2016-05-16 LAB — LIPID PANEL
CHOLESTEROL: 166 mg/dL (ref <200)
HDL: 55 mg/dL (ref >50)
LDL Cholesterol: 88 mg/dL (ref <100)
Total CHOL/HDL Ratio: 3 Ratio (ref <5.0)
Triglycerides: 115 mg/dL (ref <150)
VLDL: 23 mg/dL (ref <30)

## 2016-05-16 LAB — HEMOGLOBIN A1C
Hgb A1c MFr Bld: 5.4 % (ref <5.7)
MEAN PLASMA GLUCOSE: 108 mg/dL

## 2016-05-18 ENCOUNTER — Encounter: Payer: Self-pay | Admitting: Family Medicine

## 2016-05-18 ENCOUNTER — Ambulatory Visit (INDEPENDENT_AMBULATORY_CARE_PROVIDER_SITE_OTHER): Payer: BLUE CROSS/BLUE SHIELD | Admitting: Family Medicine

## 2016-05-18 VITALS — BP 126/78 | HR 74 | Temp 98.7°F | Resp 17 | Ht 66.0 in | Wt 182.0 lb

## 2016-05-18 DIAGNOSIS — E876 Hypokalemia: Secondary | ICD-10-CM | POA: Diagnosis not present

## 2016-05-18 DIAGNOSIS — E784 Other hyperlipidemia: Secondary | ICD-10-CM

## 2016-05-18 DIAGNOSIS — J3089 Other allergic rhinitis: Secondary | ICD-10-CM

## 2016-05-18 DIAGNOSIS — M25562 Pain in left knee: Secondary | ICD-10-CM

## 2016-05-18 DIAGNOSIS — E663 Overweight: Secondary | ICD-10-CM

## 2016-05-18 DIAGNOSIS — I10 Essential (primary) hypertension: Secondary | ICD-10-CM | POA: Diagnosis not present

## 2016-05-18 DIAGNOSIS — M25561 Pain in right knee: Secondary | ICD-10-CM

## 2016-05-18 DIAGNOSIS — F5101 Primary insomnia: Secondary | ICD-10-CM

## 2016-05-18 DIAGNOSIS — G4733 Obstructive sleep apnea (adult) (pediatric): Secondary | ICD-10-CM

## 2016-05-18 DIAGNOSIS — E7849 Other hyperlipidemia: Secondary | ICD-10-CM

## 2016-05-18 MED ORDER — IBUPROFEN 800 MG PO TABS: ORAL_TABLET | ORAL | 2 refills | Status: DC

## 2016-05-18 MED ORDER — TRIAMTERENE-HCTZ 37.5-25 MG PO TABS: ORAL_TABLET | ORAL | 1 refills | Status: DC

## 2016-05-18 NOTE — Assessment & Plan Note (Signed)
Controlled with as needed medication 

## 2016-05-18 NOTE — Assessment & Plan Note (Signed)
.  cvon1 DASH diet and commitment to daily physical activity for a minimum of 30 minutes discussed and encouraged, as a part of hypertension management. The importance of attaining a healthy weight is also discussed.  BP/Weight 05/18/2016 11/29/2015 11/05/2015 06/10/2015 02/03/2015 [id number]  Systolic BP 123XX123 [id number] [id number] [id number]  Diastolic BP 78 59 78 78 80 71 61  Wt. (Lbs) 182.04 187 184 179 180 - 181  BMI 29.38 30.2 29.71 28.91 29.07 - 29.23

## 2016-05-18 NOTE — Assessment & Plan Note (Signed)
.   Hyperlipidemia:Low fat diet discussed and encouraged.   Lipid Panel  Lab Results  Component Value Date   CHOL 166 05/10/2016   HDL 55 05/10/2016   LDLCALC 88 05/10/2016   TRIG 115 05/10/2016   CHOLHDL 3.0 05/10/2016   Reduce lipitor to 3 times weekly, attempting to wean off of medication Updated lab needed at/ before next visit.

## 2016-05-18 NOTE — Assessment & Plan Note (Signed)
Improved Patient re-educated about  the importance of commitment to a  minimum of 150 minutes of exercise per week.  The importance of healthy food choices with portion control discussed. Encouraged to start a food diary, count calories and to consider  joining a support group. Sample diet sheets offered. Goals set by the patient for the next several months.   Weight /BMI 05/18/2016 11/29/2015 11/05/2015  WEIGHT 182 lb 0.6 oz 187 lb 184 lb  HEIGHT 5\' 6"  5\' 6"  5\' 6"   BMI 29.38 kg/m2 30.2 kg/m2 29.71 kg/m2

## 2016-05-18 NOTE — Progress Notes (Signed)
Lori Sims     MRN: [id number]      DOB: 25-Mar-1961   HPI Lori Sims is here for follow up and re-evaluation of chronic medical conditions, medication management and review of any available recent lab and radiology data.  Preventive health is updated, specifically  Cancer screening and Immunization.   Questions or concerns regarding consultations or procedures which the PT has had in the interim are  addressed. The PT states she wants to get off all meds as feels they are causing hair loss , also  C/o uncontrolled knee pain, requests ibuprofen for as needed use will taper to lowest effective dose, recently moved and has been over exerting herself Dx with sleep apnea, but intolerant of cPAP, also states zolpidem not effective in keeping her asleep Wants to reduce lipitor dose/ frequency in hope she can d/c med  ROS Denies recent fever or chills. Denies sinus pressure, nasal congestion, ear pain or sore throat. Denies chest congestion, productive cough or wheezing. Denies chest pains, palpitations and leg swelling Denies abdominal pain, nausea, vomiting,diarrhea or constipation.   Denies dysuria, frequency, hesitancy or incontinence. . Denies headaches, seizures, numbness, or tingling. Denies depression, anxiety  Denies skin break down or rash.   PE  BP 126/78 (BP Location: Left Arm, Patient Position: Sitting, Cuff Size: Normal)   Pulse 74   Temp 98.7 F (37.1 C) (Oral)   Resp 17   Ht 5\' 6"  (1.676 m)   Wt 182 lb 0.6 oz (82.6 kg)   LMP 08/26/2014   SpO2 100%   BMI 29.38 kg/m   Patient alert and oriented and in no cardiopulmonary distress.  HEENT: No facial asymmetry, EOMI,   oropharynx pink and moist.  Neck supple no JVD, no mass.  Chest: Clear to auscultation bilaterally.  CVS: S1, S2 no murmurs, no S3.Regular rate.  ABD: Soft non tender.   Ext: No edema  MS: Adequate ROM spine, shoulders, hips and reduced in  knees.  Skin: Intact, no ulcerations or rash  noted.  Psych: Good eye contact, normal affect. Memory intact not anxious or depressed appearing.  CNS: CN 2-12 intact, power,  normal throughout.no focal deficits noted.   Assessment & Plan   Essential hypertension .cvon1 DASH diet and commitment to daily physical activity for a minimum of 30 minutes discussed and encouraged, as a part of hypertension management. The importance of attaining a healthy weight is also discussed.  BP/Weight 05/18/2016 11/29/2015 11/05/2015 06/10/2015 02/03/2015 [id number]  Systolic BP 123XX123 [id number] [id number]  Diastolic BP 78 59 78 78 80 71 61  Wt. (Lbs) 182.04 187 184 179 180 - 181  BMI 29.38 30.2 29.71 28.91 29.07 - 29.23       Allergic rhinitis Controlled with as needed medication  Insomnia Not well controlled Sleep hygiene reviewed and written information offered also. Prescription sent for  medication needed.   Overweight (BMI 25.0-29.9) Improved Patient re-educated about  the importance of commitment to a  minimum of 150 minutes of exercise per week.  The importance of healthy food choices with portion control discussed. Encouraged to start a food diary, count calories and to consider  joining a support group. Sample diet sheets offered. Goals set by the patient for the next several months.   Weight /BMI 05/18/2016 11/29/2015 11/05/2015  WEIGHT 182 lb 0.6 oz 187 lb 184 lb  HEIGHT 5\' 6"  5\' 6"  5\' 6"   BMI 29.38 kg/m2 30.2 kg/m2 29.71 kg/m2  Obstructive sleep apnea Unable to tolerate CPAP, encouraged to continue to work with Pre scriber for equipment she can use  Hyperlipidemia . Hyperlipidemia:Low fat diet discussed and encouraged.   Lipid Panel  Lab Results  Component Value Date   CHOL 166 05/10/2016   HDL 55 05/10/2016   LDLCALC 88 05/10/2016   TRIG 115 05/10/2016   CHOLHDL 3.0 05/10/2016   Reduce lipitor to 3 times weekly, attempting to wean off of medication Updated lab needed at/ before next  visit.     KNEE PAIN, RIGHT Increased knee pain x 1 month, ibuprofen prescribed for judicious use

## 2016-05-18 NOTE — Assessment & Plan Note (Signed)
Not well controlled Sleep hygiene reviewed and written information offered also. Prescription sent for  medication needed.  

## 2016-05-18 NOTE — Assessment & Plan Note (Signed)
Increased knee pain x 1 month, ibuprofen prescribed for judicious use

## 2016-05-18 NOTE — Patient Instructions (Addendum)
F/u in 5 month, call if you need me sooner  Please try to increase intake of foods rich in potassium as this is slightly low  Ibuprofen sent in for knee pain, oK to take twice daily for 3 to 5 days, then one daily if needed, lowest effective dose is safest  Non fast che,m 7 in end January / early Feb  Change to increased intake of plants , commit to 30 mins exercise daily to improve overall health  Reduce lipitor to 3 times per week as we discussed    Fasting lipid, cmp in 5 months  Thank you  for choosing [facility]. We consider it a privelige to serve you.  Delivering excellent health care in a caring and  compassionate way is our goal.  Partnering with you,  so that together we can achieve this goal is our strategy.

## 2016-05-18 NOTE — Assessment & Plan Note (Signed)
Unable to tolerate CPAP, encouraged to continue to work with Pre scriber for equipment she can use

## 2016-05-19 ENCOUNTER — Other Ambulatory Visit: Payer: Self-pay

## 2016-05-19 MED ORDER — IBUPROFEN 800 MG PO TABS: ORAL_TABLET | ORAL | 2 refills | Status: DC

## 2016-05-20 ENCOUNTER — Other Ambulatory Visit: Payer: Self-pay

## 2016-07-09 LAB — BASIC METABOLIC PANEL
BUN: 14 mg/dL (ref 7–25)
CHLORIDE: 104 mmol/L (ref 98–110)
CO2: 32 mmol/L — ABNORMAL HIGH (ref 20–31)
Calcium: 9.3 mg/dL (ref 8.6–10.4)
Creat: 1.13 mg/dL — ABNORMAL HIGH (ref 0.50–1.05)
Glucose, Bld: 87 mg/dL (ref 65–99)
Potassium: 3.4 mmol/L — ABNORMAL LOW (ref 3.5–5.3)
Sodium: 144 mmol/L (ref 135–146)

## 2016-07-10 ENCOUNTER — Encounter: Payer: Self-pay | Admitting: Family Medicine

## 2016-09-23 DIAGNOSIS — G4733 Obstructive sleep apnea (adult) (pediatric): Secondary | ICD-10-CM | POA: Diagnosis not present

## 2016-10-08 DIAGNOSIS — E876 Hypokalemia: Secondary | ICD-10-CM | POA: Diagnosis not present

## 2016-10-08 DIAGNOSIS — I10 Essential (primary) hypertension: Secondary | ICD-10-CM | POA: Diagnosis not present

## 2016-10-08 LAB — LIPID PANEL
CHOL/HDL RATIO: 2.7 ratio (ref <5.0)
CHOLESTEROL: 144 mg/dL (ref <200)
HDL: 53 mg/dL (ref >50)
LDL Cholesterol: 72 mg/dL (ref <100)
TRIGLYCERIDES: 95 mg/dL (ref <150)
VLDL: 19 mg/dL (ref <30)

## 2016-10-08 LAB — COMPREHENSIVE METABOLIC PANEL
ALBUMIN: 3.8 g/dL (ref 3.6–5.1)
ALT: 15 U/L (ref 6–29)
AST: 20 U/L (ref 10–35)
Alkaline Phosphatase: 68 U/L (ref 33–130)
BILIRUBIN TOTAL: 0.3 mg/dL (ref 0.2–1.2)
BUN: 15 mg/dL (ref 7–25)
CALCIUM: 9 mg/dL (ref 8.6–10.4)
CHLORIDE: 102 mmol/L (ref 98–110)
CO2: 31 mmol/L (ref 20–31)
Creat: 1.13 mg/dL — ABNORMAL HIGH (ref 0.50–1.05)
Glucose, Bld: 87 mg/dL (ref 65–99)
POTASSIUM: 3.2 mmol/L — AB (ref 3.5–5.3)
Sodium: 141 mmol/L (ref 135–146)
Total Protein: 6.6 g/dL (ref 6.1–8.1)

## 2016-10-18 ENCOUNTER — Ambulatory Visit (INDEPENDENT_AMBULATORY_CARE_PROVIDER_SITE_OTHER): Payer: BLUE CROSS/BLUE SHIELD | Admitting: Family Medicine

## 2016-10-18 ENCOUNTER — Encounter: Payer: Self-pay | Admitting: Family Medicine

## 2016-10-18 VITALS — BP 120/70 | HR 62 | Resp 16 | Ht 66.0 in | Wt 190.0 lb

## 2016-10-18 DIAGNOSIS — I10 Essential (primary) hypertension: Secondary | ICD-10-CM | POA: Diagnosis not present

## 2016-10-18 DIAGNOSIS — E663 Overweight: Secondary | ICD-10-CM

## 2016-10-18 DIAGNOSIS — F5104 Psychophysiologic insomnia: Secondary | ICD-10-CM

## 2016-10-18 DIAGNOSIS — E784 Other hyperlipidemia: Secondary | ICD-10-CM

## 2016-10-18 DIAGNOSIS — F32A Depression, unspecified: Secondary | ICD-10-CM

## 2016-10-18 DIAGNOSIS — G4733 Obstructive sleep apnea (adult) (pediatric): Secondary | ICD-10-CM

## 2016-10-18 DIAGNOSIS — F329 Major depressive disorder, single episode, unspecified: Secondary | ICD-10-CM

## 2016-10-18 DIAGNOSIS — E7849 Other hyperlipidemia: Secondary | ICD-10-CM

## 2016-10-18 MED ORDER — FLUOXETINE HCL 20 MG PO TABS: 20.0000 mg | ORAL_TABLET | Freq: Every day | ORAL | 3 refills | Status: DC

## 2016-10-18 MED ORDER — TRIAZOLAM 0.125 MG PO TABS: 0.1250 mg | ORAL_TABLET | Freq: Every evening | ORAL | 2 refills | Status: DC | PRN

## 2016-10-18 NOTE — Patient Instructions (Addendum)
f/u in 2.5 month, call if you need me before  New medication prescribed for blood pressure, amlodipine one daily  Please commit to daily exercise  30 minutes  All snacks need to be plant based  , halcion one at night  New medication , fluoxetine one daily for depression   Thank you  for choosing [facility]. We consider it a privelige to serve you.  Delivering excellent health care in a caring and  compassionate way is our goal.  Partnering with you,  so that together we can achieve this goal is our strategy.   New medication sent in for help with sleep

## 2016-10-20 ENCOUNTER — Encounter: Payer: Self-pay | Admitting: Family Medicine

## 2016-10-21 ENCOUNTER — Telehealth: Payer: Self-pay

## 2016-10-21 ENCOUNTER — Other Ambulatory Visit: Payer: Self-pay

## 2016-10-21 MED ORDER — AMLODIPINE BESYLATE 5 MG PO TABS: 5.0000 mg | ORAL_TABLET | Freq: Every day | ORAL | 5 refills | Status: DC

## 2016-10-21 NOTE — Assessment & Plan Note (Signed)
Hyperlipidemia:Low fat diet discussed and encouraged.   Lipid Panel  Lab Results  Component Value Date   CHOL 144 10/08/2016   HDL 53 10/08/2016   LDLCALC 72 10/08/2016   TRIG 95 10/08/2016   CHOLHDL 2.7 10/08/2016    Taking medication twice weekly , advised her that she may discontinue the statin and follow plant based diet

## 2016-10-21 NOTE — Assessment & Plan Note (Signed)
Not suicidal or homicidal Start fluoxetine and commitment to regular exercise, review in 8 weeks No psychotherapy at this time

## 2016-10-21 NOTE — Assessment & Plan Note (Signed)
Deteriorated. Patient re-educated about  the importance of commitment to a  minimum of 150 minutes of exercise per week.  The importance of healthy food choices with portion control discussed. Encouraged to start a food diary, count calories and to consider  joining a support group. Sample diet sheets offered. Goals set by the patient for the next several months.   Weight /BMI 10/18/2016 05/18/2016 11/29/2015  WEIGHT 190 lb 182 lb 0.6 oz 187 lb  HEIGHT 5\' 6"  5\' 6"  5\' 6"   BMI 30.67 kg/m2 29.38 kg/m2 30.2 kg/m2

## 2016-10-21 NOTE — Assessment & Plan Note (Signed)
Sleep hygiene reviewed, no success with multiple agents, some of which is related to her depression. Trial of halcion

## 2016-10-21 NOTE — Telephone Encounter (Signed)
pls send in the script which I just printed for 5mg  amlodipine dose and let pt know that I DO apologize for the oversight Thanks

## 2016-10-21 NOTE — Progress Notes (Signed)
Lori Sims     MRN: [id number]      DOB: Dec 24, 1960   HPI Lori Sims is here for follow up and re-evaluation of chronic medical conditions, medication management and review of any available recent lab and radiology data.  Preventive health is updated, specifically  Cancer screening and Immunization.   c/o fatigue and lack of sleep c/o weight gain and hair loss and dry mouth, she attributes the latter 2 to maxzide and wants to change to another BP medication Not exercising and eating all day to stay awake Though good things are happening in her life, she says that she is depressed, has been on medication in the past, is not suicidal or homicidal , but needs help ROS Denies recent fever or chills. Denies sinus pressure, nasal congestion, ear pain or sore throat. Denies chest congestion, productive cough or wheezing. Denies chest pains, palpitations and leg swelling Denies abdominal pain, nausea, vomiting,diarrhea or constipation.   Denies dysuria, frequency, hesitancy or incontinence. Denies joint pain, swelling and limitation in mobility. Denies headaches, seizures, numbness, or tingling.  Denies skin break down or rash.   PE  BP 120/70   Pulse 62   Resp 16   Ht 5\' 6"  (1.676 m)   Wt 190 lb (86.2 kg)   LMP 08/26/2014   SpO2 100%   BMI 30.67 kg/m   Patient alert and oriented and in no cardiopulmonary distress.  HEENT: No facial asymmetry, EOMI,   oropharynx pink and moist.  Neck supple no JVD, no mass.  Chest: Clear to auscultation bilaterally.  CVS: S1, S2 no murmurs, no S3.Regular rate.  ABD: Soft non tender.   Ext: No edema  MS: Adequate ROM spine, shoulders, hips and knees.  Skin: Intact, no ulcerations or rash noted.  Psych: Good eye contact, flat  affect. Memory intact not anxious or depressed appearing.  CNS: CN 2-12 intact, power,  normal throughout.no focal deficits noted.   Assessment & Plan  Essential hypertension Reports intolerance to maxzide  with hair loss and dry mouth, will change to amlodipine 5 mg, f/iu in 2 monht DASH diet and commitment to daily physical activity for a minimum of 30 minutes discussed and encouraged, as a part of hypertension management. The importance of attaining a healthy weight is also discussed.  BP/Weight 10/18/2016 05/18/2016 11/29/2015 11/05/2015 06/10/2015 02/03/2015 5/88/5027  Systolic BP 741 287 867 672 094 709 628  Diastolic BP 70 78 59 78 78 80 71  Wt. (Lbs) 190 182.04 187 184 179 180 -  BMI 30.67 29.38 30.2 29.71 28.91 29.07 -       Depression Not suicidal or homicidal Start fluoxetine and commitment to regular exercise, review in 8 weeks No psychotherapy at this time  Insomnia Sleep hygiene reviewed, no success with multiple agents, some of which is related to her depression. Trial of halcion  Overweight (BMI 25.0-29.9) Deteriorated. Patient re-educated about  the importance of commitment to a  minimum of 150 minutes of exercise per week.  The importance of healthy food choices with portion control discussed. Encouraged to start a food diary, count calories and to consider  joining a support group. Sample diet sheets offered. Goals set by the patient for the next several months.   Weight /BMI 10/18/2016 05/18/2016 11/29/2015  WEIGHT 190 lb 182 lb 0.6 oz 187 lb  HEIGHT 5\' 6"  5\' 6"  5\' 6"   BMI 30.67 kg/m2 29.38 kg/m2 30.2 kg/m2      Obstructive sleep apnea States unable to keep  CPAP mask on regularly and does not think that it is beneficial, but she will continue to work with it  Hyperlipidemia Hyperlipidemia:Low fat diet discussed and encouraged.   Lipid Panel  Lab Results  Component Value Date   CHOL 144 10/08/2016   HDL 53 10/08/2016   LDLCALC 72 10/08/2016   TRIG 95 10/08/2016   CHOLHDL 2.7 10/08/2016    Taking medication twice weekly , advised her that she may discontinue the statin and follow plant based diet

## 2016-10-21 NOTE — Telephone Encounter (Signed)
Med sent and message sent back to patient

## 2016-10-21 NOTE — Telephone Encounter (Signed)
The AVS said new BP med amlodipine was being sent but nothing was. What strength and directions and I will send.

## 2016-10-21 NOTE — Assessment & Plan Note (Signed)
Reports intolerance to maxzide with hair loss and dry mouth, will change to amlodipine 5 mg, f/iu in 2 monht DASH diet and commitment to daily physical activity for a minimum of 30 minutes discussed and encouraged, as a part of hypertension management. The importance of attaining a healthy weight is also discussed.  BP/Weight 10/18/2016 05/18/2016 11/29/2015 11/05/2015 06/10/2015 02/03/2015 8/41/2820  Systolic BP 813 887 195 974 718 550 158  Diastolic BP 70 78 59 78 78 80 71  Wt. (Lbs) 190 182.04 187 184 179 180 -  BMI 30.67 29.38 30.2 29.71 28.91 29.07 -

## 2016-10-21 NOTE — Assessment & Plan Note (Signed)
States unable to keep CPAP mask on regularly and does not think that it is beneficial, but she will continue to work with it

## 2016-10-23 DIAGNOSIS — G4733 Obstructive sleep apnea (adult) (pediatric): Secondary | ICD-10-CM | POA: Diagnosis not present

## 2016-11-17 ENCOUNTER — Encounter: Payer: Self-pay | Admitting: Family Medicine

## 2016-11-18 ENCOUNTER — Other Ambulatory Visit: Payer: Self-pay | Admitting: Family Medicine

## 2016-11-18 MED ORDER — TRIAMTERENE-HCTZ 37.5-25 MG PO TABS: 1.0000 | ORAL_TABLET | Freq: Every day | ORAL | 3 refills | Status: DC

## 2016-11-23 DIAGNOSIS — G4733 Obstructive sleep apnea (adult) (pediatric): Secondary | ICD-10-CM | POA: Diagnosis not present

## 2016-12-23 ENCOUNTER — Encounter: Payer: Self-pay | Admitting: Family Medicine

## 2016-12-28 ENCOUNTER — Other Ambulatory Visit: Payer: Self-pay | Admitting: Family Medicine

## 2017-01-05 ENCOUNTER — Ambulatory Visit: Payer: BLUE CROSS/BLUE SHIELD | Admitting: Family Medicine

## 2017-01-26 ENCOUNTER — Other Ambulatory Visit: Payer: Self-pay | Admitting: Family Medicine

## 2017-01-26 DIAGNOSIS — M25531 Pain in right wrist: Secondary | ICD-10-CM | POA: Diagnosis not present

## 2017-01-26 DIAGNOSIS — M25562 Pain in left knee: Secondary | ICD-10-CM | POA: Diagnosis not present

## 2017-01-26 DIAGNOSIS — G4700 Insomnia, unspecified: Secondary | ICD-10-CM

## 2017-01-26 MED ORDER — ZOLPIDEM TARTRATE 10 MG PO TABS: 10.0000 mg | ORAL_TABLET | Freq: Every evening | ORAL | 0 refills | Status: DC | PRN

## 2017-02-06 ENCOUNTER — Other Ambulatory Visit: Payer: Self-pay | Admitting: Family Medicine

## 2017-02-20 ENCOUNTER — Other Ambulatory Visit: Payer: Self-pay | Admitting: Family Medicine

## 2017-02-20 DIAGNOSIS — Z1231 Encounter for screening mammogram for malignant neoplasm of breast: Secondary | ICD-10-CM

## 2017-02-25 ENCOUNTER — Other Ambulatory Visit: Payer: Self-pay | Admitting: Family Medicine

## 2017-02-25 DIAGNOSIS — G4700 Insomnia, unspecified: Secondary | ICD-10-CM

## 2017-02-27 NOTE — Telephone Encounter (Signed)
Seen 5 8 18 

## 2017-03-27 ENCOUNTER — Other Ambulatory Visit: Payer: Self-pay | Admitting: Family Medicine

## 2017-03-27 ENCOUNTER — Ambulatory Visit (HOSPITAL_COMMUNITY)
Admission: RE | Admit: 2017-03-27 | Discharge: 2017-03-27 | Disposition: A | Discharge status: Home / Self Care | Payer: BLUE CROSS/BLUE SHIELD | Source: Ambulatory Visit | Attending: Family Medicine | Admitting: Family Medicine

## 2017-03-27 DIAGNOSIS — Z1231 Encounter for screening mammogram for malignant neoplasm of breast: Secondary | ICD-10-CM

## 2017-03-27 DIAGNOSIS — G4700 Insomnia, unspecified: Secondary | ICD-10-CM

## 2017-03-28 ENCOUNTER — Other Ambulatory Visit: Payer: Self-pay | Admitting: *Deleted

## 2017-03-28 DIAGNOSIS — G4700 Insomnia, unspecified: Secondary | ICD-10-CM

## 2017-03-28 MED ORDER — ZOLPIDEM TARTRATE 10 MG PO TABS: 10.0000 mg | ORAL_TABLET | Freq: Every evening | ORAL | 0 refills | Status: DC | PRN

## 2017-03-29 ENCOUNTER — Encounter: Payer: Self-pay | Admitting: Family Medicine

## 2017-03-29 ENCOUNTER — Other Ambulatory Visit: Payer: Self-pay | Admitting: Family Medicine

## 2017-03-29 DIAGNOSIS — G4700 Insomnia, unspecified: Secondary | ICD-10-CM

## 2017-03-30 ENCOUNTER — Telehealth: Payer: Self-pay

## 2017-03-30 MED ORDER — ZOLPIDEM TARTRATE 10 MG PO TABS: 10.0000 mg | ORAL_TABLET | Freq: Every evening | ORAL | 0 refills | Status: DC | PRN

## 2017-04-03 ENCOUNTER — Ambulatory Visit (HOSPITAL_COMMUNITY)
Admission: EM | Admit: 2017-04-03 | Discharge: 2017-04-03 | Disposition: A | Discharge status: Home / Self Care | Payer: BLUE CROSS/BLUE SHIELD | Attending: Nurse Practitioner | Admitting: Nurse Practitioner

## 2017-04-03 ENCOUNTER — Encounter: Payer: Self-pay | Admitting: Family Medicine

## 2017-04-03 ENCOUNTER — Other Ambulatory Visit: Payer: Self-pay | Admitting: Family Medicine

## 2017-04-03 ENCOUNTER — Encounter (HOSPITAL_COMMUNITY): Payer: Self-pay | Admitting: Emergency Medicine

## 2017-04-03 DIAGNOSIS — G4700 Insomnia, unspecified: Secondary | ICD-10-CM

## 2017-04-03 DIAGNOSIS — B9789 Other viral agents as the cause of diseases classified elsewhere: Secondary | ICD-10-CM | POA: Diagnosis not present

## 2017-04-03 DIAGNOSIS — J069 Acute upper respiratory infection, unspecified: Secondary | ICD-10-CM | POA: Diagnosis not present

## 2017-04-03 MED ORDER — ZOLPIDEM TARTRATE 10 MG PO TABS: 10.0000 mg | ORAL_TABLET | Freq: Every evening | ORAL | 0 refills | Status: DC | PRN

## 2017-04-03 MED ORDER — IBUPROFEN 800 MG PO TABS: 800.0000 mg | ORAL_TABLET | Freq: Three times a day (TID) | ORAL | 0 refills | Status: DC | PRN

## 2017-04-03 MED ORDER — BENZONATATE 100 MG PO CAPS: 100.0000 mg | ORAL_CAPSULE | Freq: Three times a day (TID) | ORAL | 0 refills | Status: DC

## 2017-04-03 NOTE — ED Triage Notes (Signed)
Pt c/o cough, chest congestion, chills, runny nose.

## 2017-04-03 NOTE — ED Provider Notes (Signed)
[place]    CSN: [id number] Arrival date & time: 04/03/17  1752     History   Chief Complaint Chief Complaint  Patient presents with  . Cough  . Nasal Congestion    HPI Persephanie Laatsch is a 56 y.o. female.   Subjective:   Zenya Hickam is a 56 y.o. female who presents for evaluation of symptoms of a URI. Symptoms include chest congestion, chest pain during cough, dry cough, headache, myalgias, nasal congestion and sore throat. Onset of symptoms was 1 month after receiving the flu vaccine and has been unchanged since that time. She is drinking plenty of fluids. Evaluation to date: none. Treatment to date: Over-the-counter therapies with some relief.  The following portions of the patient's history were reviewed and updated as appropriate: allergies, current medications, past family history, past medical history, past social history, past surgical history and problem list.          Past Medical History:  Diagnosis Date  . GERD (gastroesophageal reflux disease)   . Hypercholesterolemia   . Hypertension     Patient Active Problem List   Diagnosis Date Noted  . Lateral epicondylitis of left elbow 11/05/2015  . Obstructive sleep apnea 11/05/2015  . Alopecia 06/10/2015  . Insomnia 06/10/2015  . Dysphagia, pharyngoesophageal phase 08/27/2014  . Anemia 08/27/2014  . Hot flashes, menopausal 07/17/2014  . GERD (gastroesophageal reflux disease) 07/17/2014  . Overweight (BMI 25.0-29.9) 07/01/2013  . Metabolic syndrome X 67/67/2094  . Vitamin D deficiency 12/02/2010  . Hyperlipidemia 02/05/2010  . KNEE PAIN, RIGHT 01/27/2010  . Depression 04/02/2009  . B12 DEFICIENCY 07/10/2008  . Essential hypertension 07/06/2008  . GOITER, UNSPECIFIED 07/02/2008  . ENDOMETRIOSIS 07/02/2008  . IRON DEFIC ANEMIA [place] DIET IRON INTAKE 04/28/2008  . Allergic rhinitis 04/28/2008    Past Surgical History:  Procedure Laterality Date  . CESAREAN SECTION    . COLONOSCOPY   2010   Dr. Laural Golden: normal except external hemorrhoids  . COLONOSCOPY N/A 09/12/2014   Procedure: COLONOSCOPY;  Surgeon: Danie Binder, MD;  Location: [facility];  Service: Endoscopy;  Laterality: N/A;  1030   . ESOPHAGEAL DILATION N/A 09/12/2014   Procedure: ESOPHAGEAL DILATION;  Surgeon: Danie Binder, MD;  Location: [facility];  Service: Endoscopy;  Laterality: N/A;  . ESOPHAGOGASTRODUODENOSCOPY N/A 09/12/2014   Procedure: ESOPHAGOGASTRODUODENOSCOPY (EGD);  Surgeon: Danie Binder, MD;  Location: [facility];  Service: Endoscopy;  Laterality: N/A;  . fibroid tumor right breast    . fibroid tumors left breast    . OVARIAN CYST DRAINAGE    . OVARIAN CYST REMOVAL      OB History    No data available       Home Medications    Prior to Admission medications   Medication Sig Start Date End Date Taking? Authorizing Provider  atorvastatin (LIPITOR) 20 MG tablet TAKE 1 TABLET BY MOUTH EVERY DAY 02/06/17  Yes Fayrene Helper, MD  Cholecalciferol (VITAMIN D3) 10000 units TABS Take by mouth. 125 mcg daily   Yes [provider]  ibuprofen (ADVIL,MOTRIN) 800 MG tablet One tablet once daily as needed, for knee pain. May take up to two tablets in 24 hours for flare 05/19/16  Yes Fayrene Helper, MD  Potassium 99 MG TABS Take by mouth. 1 tab daily   Yes [provider]  triamterene-hydrochlorothiazide (MAXZIDE-25) 37.5-25 MG tablet Take 1 tablet by mouth daily. 11/18/16  Yes Fayrene Helper, MD  zolpidem [facility]) 10  MG tablet Take 1 tablet (10 mg total) by mouth at bedtime as needed. for sleep 03/30/17  Yes Fayrene Helper, MD  FLUoxetine (PROZAC) 20 MG tablet Take 1 tablet (20 mg total) by mouth daily. 10/18/16   Fayrene Helper, MD  Multiple Vitamin (MULTIVITAMIN) tablet Take 1 tablet by mouth daily.    [provider]    Family History Family History  Problem Relation Age of Onset  . Diabetes Mother   . Hypertension Father   . Colon cancer Neg  Hx     Social History Social History  Substance Use Topics  . Smoking status: Never Smoker  . Smokeless tobacco: Never Used  . Alcohol use 0.0 oz/week     Comment: rare wine     Allergies   Penicillins and Tramadol   Review of Systems Review of Systems  Constitutional: Negative for chills and fever.  HENT: Positive for congestion and sore throat.   Respiratory: Negative for shortness of breath.   Cardiovascular: Positive for chest pain.  Gastrointestinal: Positive for nausea.  Musculoskeletal: Positive for myalgias.  Neurological: Positive for headaches.  All other systems reviewed and are negative.    Physical Exam Triage Vital Signs ED Triage Vitals [04/03/17 1820]  Enc Vitals Group     BP (!) 126/57     Pulse Rate 64     Resp 18     Temp 98.4 F (36.9 C)     Temp Source Oral     SpO2 100 %     Weight      Height      Head Circumference      Peak Flow      Pain Score      Pain Loc      Pain Edu?      Excl. in [place]?    No data found.   Updated Vital Signs BP (!) 126/57   Pulse 64   Temp 98.4 F (36.9 C) (Oral)   Resp 18   LMP 08/26/2014   SpO2 100%   Visual Acuity Right Eye Distance:   Left Eye Distance:   Bilateral Distance:    Right Eye Near:   Left Eye Near:    Bilateral Near:     Physical Exam  Constitutional: She is oriented to person, place, and time. She appears well-developed and well-nourished.  HENT:  Head: Normocephalic.  Right Ear: External ear normal.  Left Ear: External ear normal.  Mouth/Throat: Oropharynx is clear and moist. No oropharyngeal exudate.  Eyes: Pupils are equal, round, and reactive to light. Conjunctivae and EOM are normal.  Neck: Normal range of motion. Neck supple.  Cardiovascular: Normal rate and regular rhythm.   Pulmonary/Chest: Effort normal and breath sounds normal.  Musculoskeletal: Normal range of motion.  Lymphadenopathy:    She has no cervical adenopathy.  Neurological: She is alert and  oriented to person, place, and time.  Skin: Skin is warm and dry.  Psychiatric: She has a normal mood and affect.     UC Treatments / Results  Labs (all labs ordered are listed, but only abnormal results are displayed) Labs Reviewed - No data to display  EKG  EKG Interpretation None       Radiology No results found.  Procedures Procedures (including critical care time)  Medications Ordered in UC Medications - No data to display   Initial Impression / Assessment and Plan / UC Course  I have reviewed the triage vital signs and the nursing  notes.  Pertinent labs & imaging results that were available during my care of the patient were reviewed by me and considered in my medical decision making (see chart for details).     56 year old African-American female with history of hypertension and hyperlipidemia presents with a one-month history of URI type symptoms. Course has been unchanged since onset. She has tried several over-the-counter therapies with some relief. The patient also endorses insomnia and is out of her prescribed Ambien. She has an appointment with her PCP scheduled for next month. Will provide antitussives  and a one-week refill of her Ambien. Strict return precautions advised.   Discussed diagnosis and treatment with patient. All questions have been answered and all concerns have been addressed. The patient verbalized understanding and had no further questions   Final Clinical Impressions(s) / UC Diagnoses   Final diagnoses:  Viral URI with cough    New Prescriptions New Prescriptions   No medications on file     Controlled Substance Prescriptions [place] Controlled Substance Registry consulted? Not Applicable   Enrique Sack, [place] 04/03/17 [phone]

## 2017-04-11 ENCOUNTER — Telehealth: Payer: Self-pay | Admitting: Family Medicine

## 2017-04-11 NOTE — Telephone Encounter (Signed)
Tried to call patient and let her know we can not release her medication to anyone because she does not have a DPR. Her phone # is not correct in our system.

## 2017-04-12 ENCOUNTER — Telehealth: Payer: Self-pay | Admitting: Family Medicine

## 2017-04-12 NOTE — Telephone Encounter (Signed)
Message left giving auth

## 2017-04-12 NOTE — Telephone Encounter (Signed)
Larene Beach, pharmacist at [facility], left message on nurse line regarding patient's Lori Sims. She wants to know if she is to fill medication since patient got a 7 days supply elsewhere.   [phone]

## 2017-04-20 NOTE — Telephone Encounter (Signed)
rx done for ambien 10 18 18

## 2017-04-20 NOTE — Telephone Encounter (Signed)
Error

## 2017-05-11 ENCOUNTER — Ambulatory Visit (INDEPENDENT_AMBULATORY_CARE_PROVIDER_SITE_OTHER): Payer: BLUE CROSS/BLUE SHIELD | Admitting: Family Medicine

## 2017-05-11 ENCOUNTER — Encounter: Payer: Self-pay | Admitting: Family Medicine

## 2017-05-11 VITALS — BP 134/82 | HR 84 | Resp 16 | Ht 66.0 in | Wt 191.0 lb

## 2017-05-11 DIAGNOSIS — E559 Vitamin D deficiency, unspecified: Secondary | ICD-10-CM | POA: Diagnosis not present

## 2017-05-11 DIAGNOSIS — L659 Nonscarring hair loss, unspecified: Secondary | ICD-10-CM | POA: Diagnosis not present

## 2017-05-11 DIAGNOSIS — E663 Overweight: Secondary | ICD-10-CM | POA: Diagnosis not present

## 2017-05-11 DIAGNOSIS — G4733 Obstructive sleep apnea (adult) (pediatric): Secondary | ICD-10-CM | POA: Diagnosis not present

## 2017-05-11 DIAGNOSIS — I10 Essential (primary) hypertension: Secondary | ICD-10-CM

## 2017-05-11 DIAGNOSIS — E7849 Other hyperlipidemia: Secondary | ICD-10-CM | POA: Diagnosis not present

## 2017-05-11 DIAGNOSIS — E8881 Metabolic syndrome: Secondary | ICD-10-CM

## 2017-05-11 DIAGNOSIS — F5104 Psychophysiologic insomnia: Secondary | ICD-10-CM | POA: Diagnosis not present

## 2017-05-11 DIAGNOSIS — J3089 Other allergic rhinitis: Secondary | ICD-10-CM | POA: Diagnosis not present

## 2017-05-11 MED ORDER — ATORVASTATIN CALCIUM 20 MG PO TABS: 20.0000 mg | ORAL_TABLET | Freq: Every day | ORAL | 1 refills | Status: DC

## 2017-05-11 MED ORDER — TRIAMTERENE-HCTZ 37.5-25 MG PO TABS: 1.0000 | ORAL_TABLET | Freq: Every day | ORAL | 1 refills | Status: DC

## 2017-05-11 MED ORDER — ZOLPIDEM TARTRATE 10 MG PO TABS: 10.0000 mg | ORAL_TABLET | Freq: Every evening | ORAL | 1 refills | Status: DC | PRN

## 2017-05-11 MED ORDER — FLUOXETINE HCL 20 MG PO TABS: 20.0000 mg | ORAL_TABLET | Freq: Every day | ORAL | 1 refills | Status: DC

## 2017-05-11 NOTE — Patient Instructions (Addendum)
F/u in 6 months, call if you need me before  No medication changes  We will prescribe 6 months of medication  Fasting CBC, lipid, cmp and EGFr, TSH and vit D as soon as possible  ( [facility])   Please return the 3 stool cards  As soon as possible this is screening for colon cancer   It is important that you exercise regularly at least 30 minutes 5 times a week. If you develop chest pain, have severe difficulty breathing, or feel very tired, stop exercising immediately and seek medical attention \Please work on good  health habits so that your health will improve. 1. Commitment to daily physical activity for 30 to 60  minutes, if you are able to do this.  2. Commitment to wise food choices. Aim for half of your  food intake to be vegetable and fruit, one quarter starchy foods, and one quarter protein. Try to eat on a regular schedule  3 meals per day, snacking between meals should be limited to vegetables or fruits or small portions of nuts. 64 ounces of water per day is generally recommended, unless you have specific health conditions, like heart failure or kidney failure where you will need to limit fluid intake.  3. Commitment to sufficient and a  good quality of physical and mental rest daily, generally between 6 to 8 hours per day.  WITH PERSISTANCE AND PERSEVERANCE, THE IMPOSSIBLE , BECOMES THE NORM!

## 2017-05-20 NOTE — Assessment & Plan Note (Signed)
Updated lab needed at/ before next visit.   

## 2017-05-20 NOTE — Assessment & Plan Note (Signed)
Deteriorated. Patient re-educated about  the importance of commitment to a  minimum of 150 minutes of exercise per week.  The importance of healthy food choices with portion control discussed. Encouraged to start a food diary, count calories and to consider  joining a support group. Sample diet sheets offered. Goals set by the patient for the next several months.   Weight /BMI 05/11/2017 10/18/2016 05/18/2016  WEIGHT 191 lb 190 lb 182 lb 0.6 oz  HEIGHT 5\' 6"  5\' 6"  5\' 6"   BMI 30.83 kg/m2 30.67 kg/m2 29.38 kg/m2

## 2017-05-20 NOTE — Assessment & Plan Note (Signed)
Improved hair growth, currently hair is unprocessed, however, she reports ongoing hair loss with her BP medication but has resolved to remain on her current medication

## 2017-05-20 NOTE — Assessment & Plan Note (Signed)
Reports difficulty using CPAP and is for the most part non compliant at this time, no pconming appointmnet for re evaluation to my knowledge

## 2017-05-20 NOTE — Assessment & Plan Note (Signed)
No current symptoms

## 2017-05-20 NOTE — Assessment & Plan Note (Signed)
Sleep hygiene reviewed and written information offered also. Prescription sent for  medication needed.  

## 2017-05-20 NOTE — Progress Notes (Signed)
Lori Sims     MRN: [id number]      DOB: May 12, 1961   HPI Ms. Lori Sims is here for follow up and re-evaluation of chronic medical conditions, medication management and review of any available recent lab and radiology data.  Preventive health is updated, specifically  Cancer screening and Immunization.   The PT denies any adverse reactions to current medications since the last visit.  There are no new concerns.  There are no specific complaints  She was evaluated in the ED on 10/22  And dx with viral URI, denies any current complaints  ROS Denies recent fever or chills. Denies sinus pressure, nasal congestion, ear pain or sore throat. Denies chest congestion, productive cough or wheezing. Denies chest pains, palpitations and leg swelling Denies abdominal pain, nausea, vomiting,diarrhea or constipation.   Denies dysuria, frequency, hesitancy or incontinence. Denies joint pain, swelling and limitation in mobility. Denies headaches, seizures, numbness, or tingling. Denies depression, anxiety or insomnia. Denies skin break down or rash.   PE  BP 134/82   Pulse 84   Resp 16   Ht 5\' 6"  (1.676 m)   Wt 191 lb (86.6 kg)   LMP 08/26/2014   SpO2 98%   BMI 30.83 kg/m   Patient alert and oriented and in no cardiopulmonary distress.  HEENT: No facial asymmetry, EOMI,   oropharynx pink and moist.  Neck supple no JVD, no mass.  Chest: Clear to auscultation bilaterally.  CVS: S1, S2 no murmurs, no S3.Regular rate.  ABD: Soft non tender.   Ext: No edema  MS: Adequate ROM spine, shoulders, hips and knees.  Skin: Intact, no ulcerations or rash noted.  Psych: Good eye contact, normal affect. Memory intact not anxious or depressed appearing.  CNS: CN 2-12 intact, power,  normal throughout.no focal deficits noted.   Assessment & Plan  Essential hypertension Controlled, no change in medication DASH diet and commitment to daily physical activity for a minimum of 30 minutes  discussed and encouraged, as a part of hypertension management. The importance of attaining a healthy weight is also discussed.  BP/Weight 05/11/2017 04/03/2017 10/18/2016 05/18/2016 11/29/2015 11/05/2015 37/03/6268  Systolic BP 485 462 703 500 938 182 993  Diastolic BP 82 57 70 78 59 78 78  Wt. (Lbs) 191 - 190 182.04 187 184 179  BMI 30.83 - 30.67 29.38 30.2 29.71 28.91       Hyperlipidemia Hyperlipidemia:Low fat diet discussed and encouraged.   Lipid Panel  Lab Results  Component Value Date   CHOL 144 10/08/2016   HDL 53 10/08/2016   LDLCALC 72 10/08/2016   TRIG 95 10/08/2016   CHOLHDL 2.7 10/08/2016   Updated lab needed at/ before next visit. Controlled, no change in medication    Insomnia Sleep hygiene reviewed and written information offered also. Prescription sent for  medication needed.   Obstructive sleep apnea Reports difficulty using CPAP and is for the most part non compliant at this time, no pconming appointmnet for re evaluation to my knowledge  Overweight (BMI 25.0-29.9) Deteriorated. Patient re-educated about  the importance of commitment to a  minimum of 150 minutes of exercise per week.  The importance of healthy food choices with portion control discussed. Encouraged to start a food diary, count calories and to consider  joining a support group. Sample diet sheets offered. Goals set by the patient for the next several months.   Weight /BMI 05/11/2017 10/18/2016 05/18/2016  WEIGHT 191 lb 190 lb 182 lb 0.6 oz  HEIGHT  5\' 6"  5\' 6"  5\' 6"   BMI 30.83 kg/m2 30.67 kg/m2 29.38 kg/m2      Vitamin D deficiency Updated lab needed at/ before next visit.   Allergic rhinitis No current symptoms  Alopecia Improved hair growth, currently hair is unprocessed, however, she reports ongoing hair loss with her BP medication but has resolved to remain on her current medication

## 2017-05-20 NOTE — Assessment & Plan Note (Signed)
Hyperlipidemia:Low fat diet discussed and encouraged.   Lipid Panel  Lab Results  Component Value Date   CHOL 144 10/08/2016   HDL 53 10/08/2016   LDLCALC 72 10/08/2016   TRIG 95 10/08/2016   CHOLHDL 2.7 10/08/2016   Updated lab needed at/ before next visit. Controlled, no change in medication

## 2017-05-20 NOTE — Assessment & Plan Note (Signed)
Controlled, no change in medication DASH diet and commitment to daily physical activity for a minimum of 30 minutes discussed and encouraged, as a part of hypertension management. The importance of attaining a healthy weight is also discussed.  BP/Weight 05/11/2017 04/03/2017 10/18/2016 05/18/2016 11/29/2015 11/05/2015 82/88/3374  Systolic BP 451 460 479 987 215 872 761  Diastolic BP 82 57 70 78 59 78 78  Wt. (Lbs) 191 - 190 182.04 187 184 179  BMI 30.83 - 30.67 29.38 30.2 29.71 28.91

## 2017-06-05 DIAGNOSIS — I10 Essential (primary) hypertension: Secondary | ICD-10-CM | POA: Diagnosis not present

## 2017-06-05 DIAGNOSIS — E7849 Other hyperlipidemia: Secondary | ICD-10-CM | POA: Diagnosis not present

## 2017-06-05 DIAGNOSIS — E559 Vitamin D deficiency, unspecified: Secondary | ICD-10-CM | POA: Diagnosis not present

## 2017-06-06 ENCOUNTER — Encounter: Payer: Self-pay | Admitting: Family Medicine

## 2017-06-06 LAB — COMPLETE METABOLIC PANEL WITH GFR
AG Ratio: 1.6 (calc) (ref 1.0–2.5)
ALBUMIN MSPROF: 4.4 g/dL (ref 3.6–5.1)
ALT: 15 U/L (ref 6–29)
AST: 18 U/L (ref 10–35)
Alkaline phosphatase (APISO): 81 U/L (ref 33–130)
BUN/Creatinine Ratio: 16 (calc) (ref 6–22)
BUN: 18 mg/dL (ref 7–25)
CALCIUM: 9.5 mg/dL (ref 8.6–10.4)
CO2: 32 mmol/L (ref 20–32)
CREATININE: 1.16 mg/dL — AB (ref 0.50–1.05)
Chloride: 103 mmol/L (ref 98–110)
GFR, EST AFRICAN AMERICAN: 61 mL/min/{1.73_m2} (ref >=60)
GFR, EST NON AFRICAN AMERICAN: 52 mL/min/{1.73_m2} — AB (ref >=60)
GLOBULIN: 2.8 g/dL (ref 1.9–3.7)
Glucose, Bld: 96 mg/dL (ref 65–99)
Potassium: 3.3 mmol/L — ABNORMAL LOW (ref 3.5–5.3)
SODIUM: 144 mmol/L (ref 135–146)
TOTAL PROTEIN: 7.2 g/dL (ref 6.1–8.1)
Total Bilirubin: 0.4 mg/dL (ref 0.2–1.2)

## 2017-06-06 LAB — LIPID PANEL
CHOL/HDL RATIO: 2.9 (calc) (ref <5.0)
CHOLESTEROL: 179 mg/dL (ref <200)
HDL: 62 mg/dL (ref >50)
LDL Cholesterol (Calc): 98 mg/dL (calc)
Non-HDL Cholesterol (Calc): 117 mg/dL (calc) (ref <130)
Triglycerides: 92 mg/dL (ref <150)

## 2017-06-06 LAB — CBC
HCT: 34.7 % — ABNORMAL LOW (ref 35.0–45.0)
Hemoglobin: 11.8 g/dL (ref 11.7–15.5)
MCH: 30.3 pg (ref 27.0–33.0)
MCHC: 34 g/dL (ref 32.0–36.0)
MCV: 89.2 fL (ref 80.0–100.0)
MPV: 10.3 fL (ref 7.5–12.5)
PLATELETS: 313 10*3/uL (ref 140–400)
RBC: 3.89 10*6/uL (ref 3.80–5.10)
RDW: 12.7 % (ref 11.0–15.0)
WBC: 7 10*3/uL (ref 3.8–10.8)

## 2017-06-06 LAB — VITAMIN D 25 HYDROXY (VIT D DEFICIENCY, FRACTURES): VIT D 25 HYDROXY: 49 ng/mL (ref 30–100)

## 2017-06-06 LAB — TSH: TSH: 1.38 mIU/L (ref 0.40–4.50)

## 2017-06-19 IMAGING — DX DG KNEE COMPLETE 4+V*R*
4 series · 4 of 4 positions shown · non-contrast
Comparison: Knee MRI, 10/07/2009

CLINICAL DATA: Pain and difficulty walking since 11/05/2015.

EXAM:
RIGHT KNEE - COMPLETE 4+ VIEW

[knee ap]
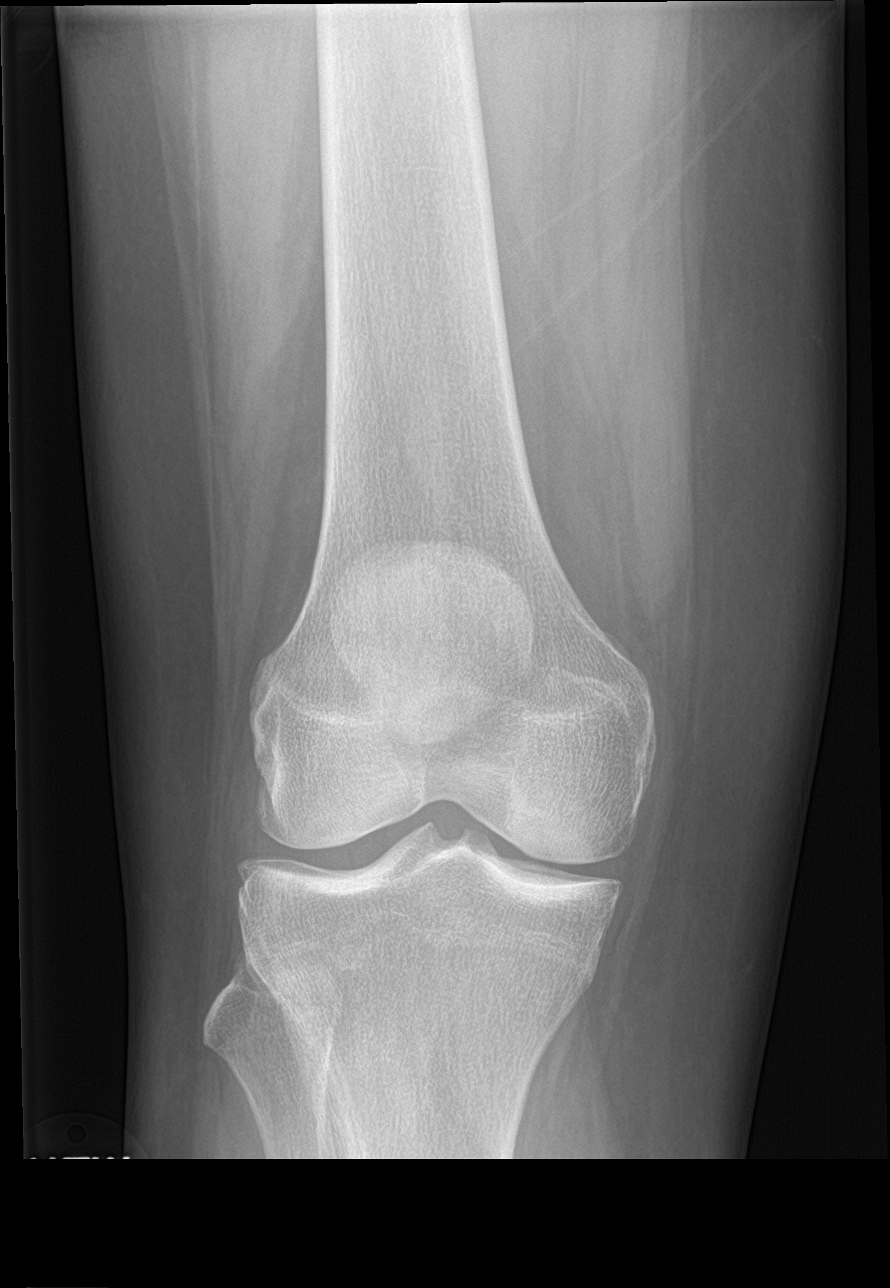

[tunnel]
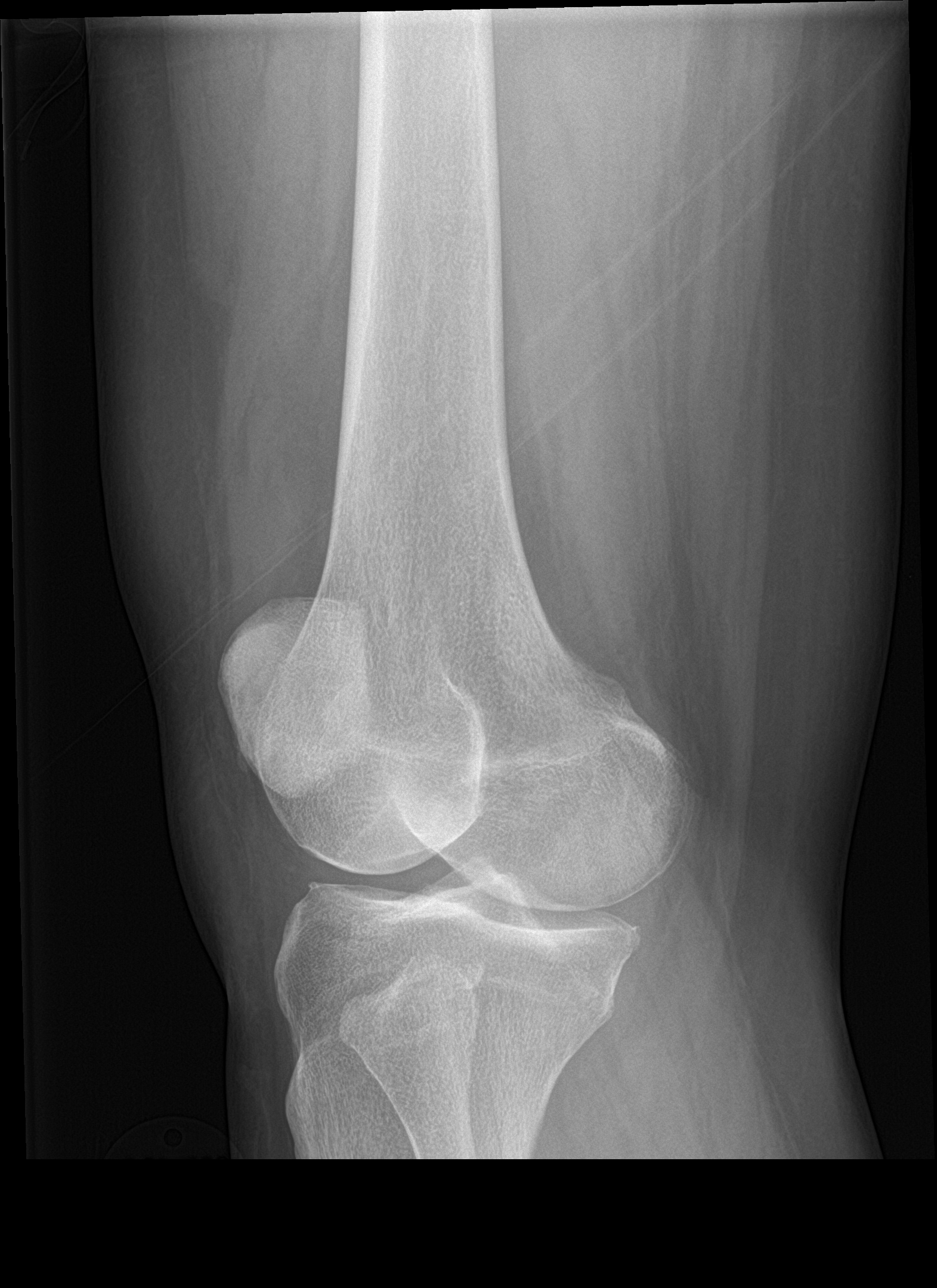

[knee lat]
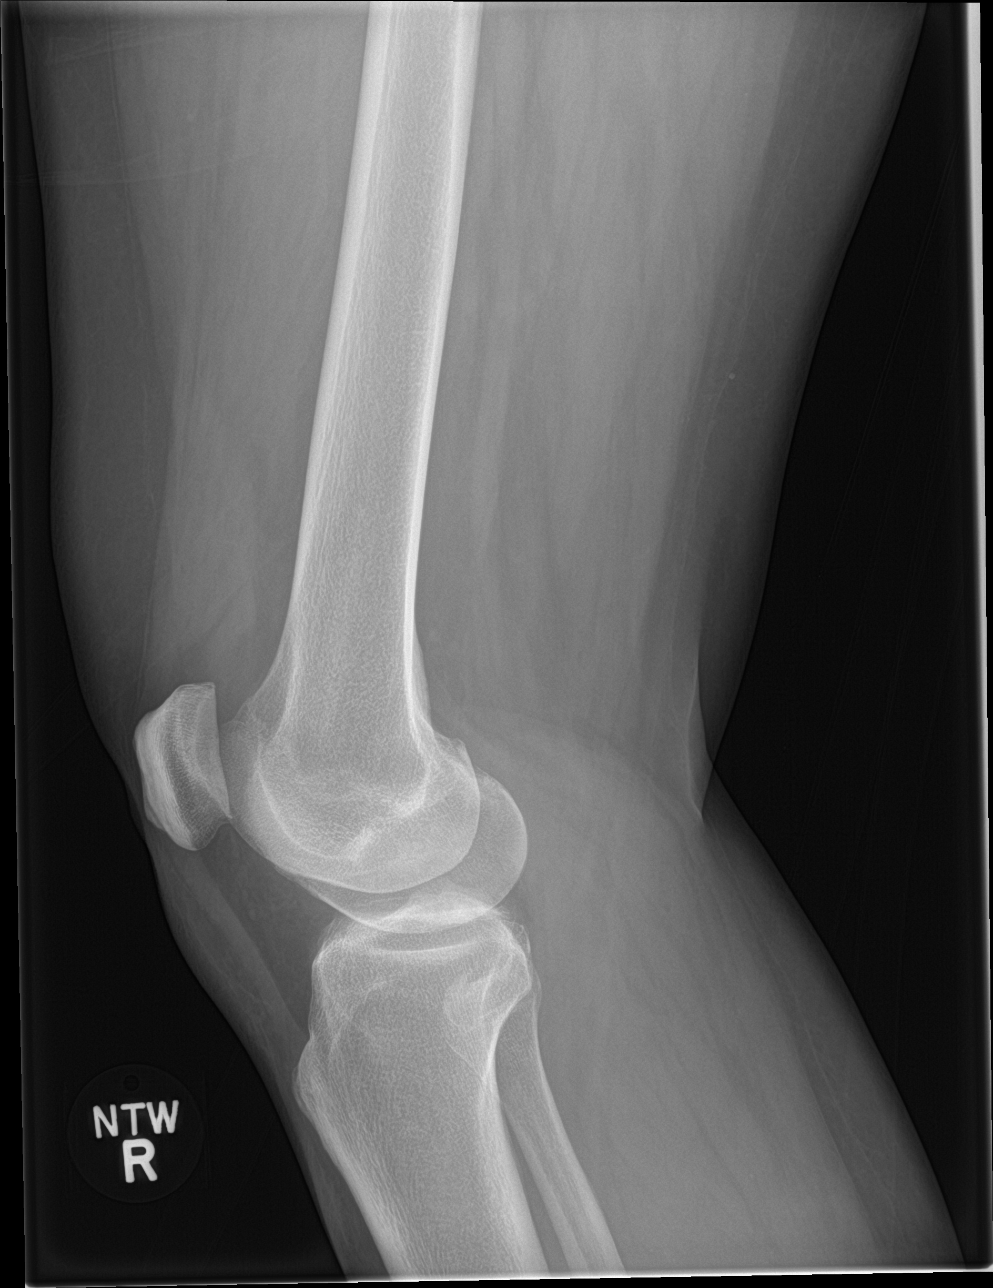

[knee sunrise]
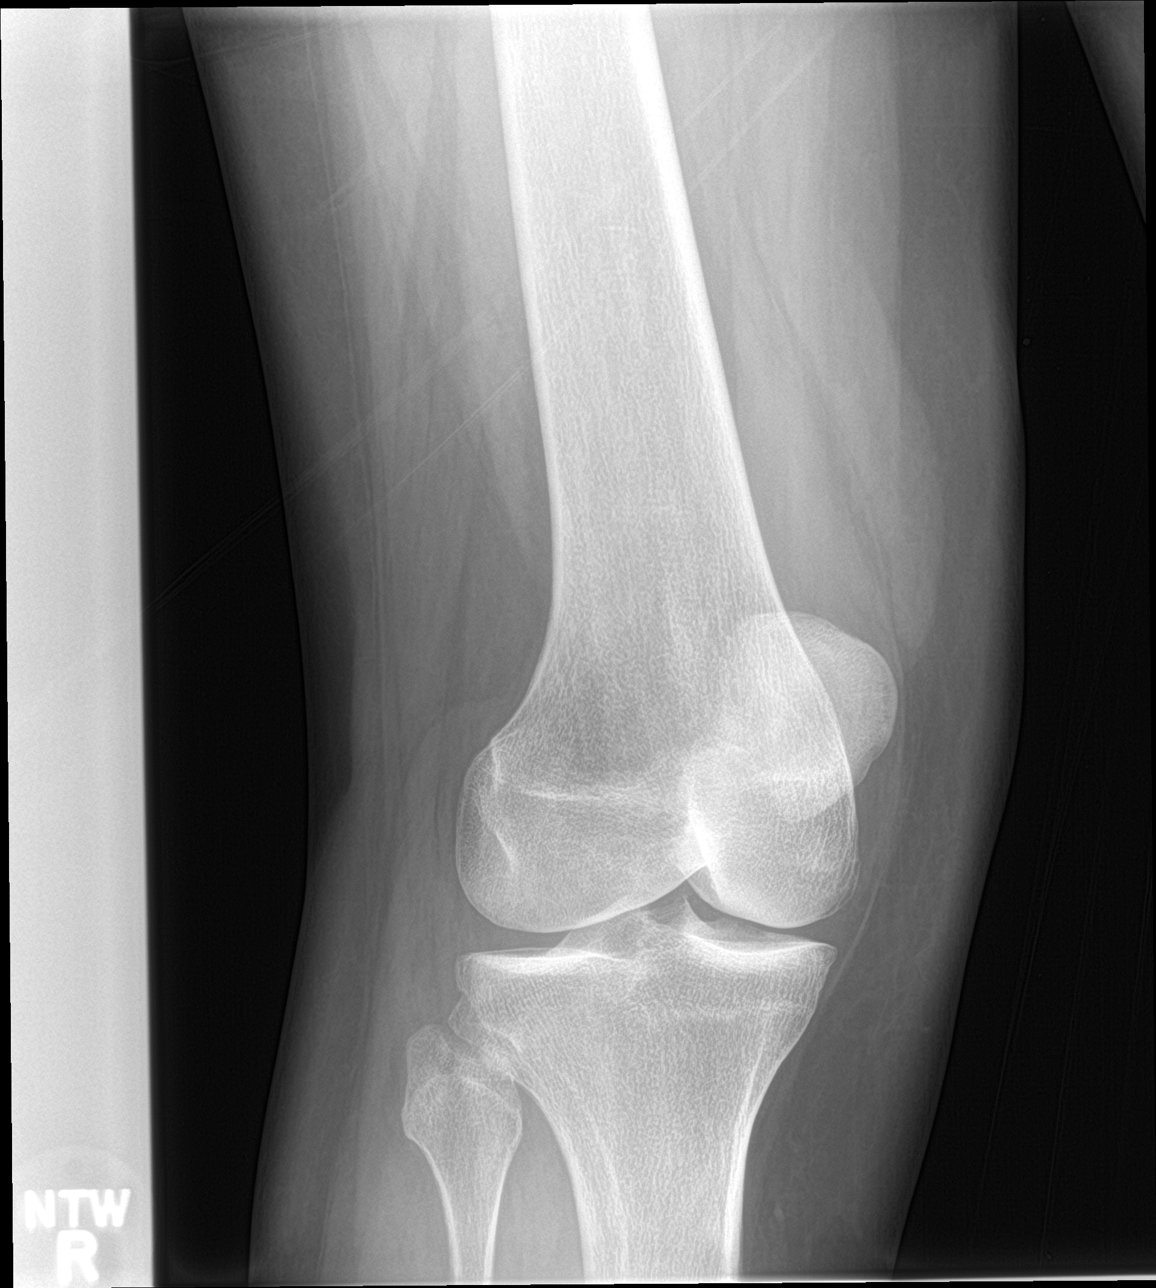

[4 of 4 positions shown; findings below may reference images not displayed]

FINDINGS: No fracture.  No bone lesion.

Knee joint is normally spaced and aligned. There are minor marginal
osteophytes. No other arthropathic change.

Small joint effusion noted on the lateral view in the suprapatellar
joint capsule.

Soft tissues are unremarkable.
IMPRESSION: 1. No fracture or bone lesion.
2. Minimal degenerative change.
3. Small joint effusion, nonspecific.

## 2017-08-02 ENCOUNTER — Encounter: Payer: Self-pay | Admitting: Family Medicine

## 2017-08-03 ENCOUNTER — Other Ambulatory Visit: Payer: Self-pay

## 2017-08-03 MED ORDER — FLUTICASONE PROPIONATE 50 MCG/ACT NA SUSP: 2.0000 | Freq: Every day | NASAL | 5 refills | Status: DC

## 2017-08-03 MED ORDER — LORATADINE 10 MG PO TABS: 10.0000 mg | ORAL_TABLET | Freq: Every day | ORAL | 5 refills | Status: DC

## 2017-11-09 ENCOUNTER — Encounter: Payer: Self-pay | Admitting: Family Medicine

## 2017-11-09 ENCOUNTER — Other Ambulatory Visit: Payer: Self-pay

## 2017-11-09 ENCOUNTER — Ambulatory Visit (INDEPENDENT_AMBULATORY_CARE_PROVIDER_SITE_OTHER): Payer: BLUE CROSS/BLUE SHIELD | Admitting: Family Medicine

## 2017-11-09 VITALS — BP 120/80 | HR 70 | Ht 66.0 in | Wt 189.0 lb

## 2017-11-09 DIAGNOSIS — E669 Obesity, unspecified: Secondary | ICD-10-CM | POA: Diagnosis not present

## 2017-11-09 DIAGNOSIS — F5104 Psychophysiologic insomnia: Secondary | ICD-10-CM

## 2017-11-09 DIAGNOSIS — I10 Essential (primary) hypertension: Secondary | ICD-10-CM | POA: Diagnosis not present

## 2017-11-09 DIAGNOSIS — G4733 Obstructive sleep apnea (adult) (pediatric): Secondary | ICD-10-CM

## 2017-11-09 DIAGNOSIS — E782 Mixed hyperlipidemia: Secondary | ICD-10-CM | POA: Diagnosis not present

## 2017-11-09 DIAGNOSIS — E66811 Obesity, class 1: Secondary | ICD-10-CM

## 2017-11-09 MED ORDER — ZOLPIDEM TARTRATE 10 MG PO TABS: ORAL_TABLET | ORAL | 5 refills | Status: DC

## 2017-11-09 MED ORDER — ZOLPIDEM TARTRATE 10 MG PO TABS: 10.0000 mg | ORAL_TABLET | Freq: Every day | ORAL | 5 refills | Status: DC

## 2017-11-09 NOTE — Patient Instructions (Addendum)
F/U in January, call if you need me before  Please consider and let me know if you want cardiology tpo work with your blood pressure management due to intolerance of your medications that you have tried , current causing poor sleep and hair loss  Please schedule  your well woman exam with pap this is past due  Mammogram is due Oct 16 or after please schedule  Fasting lipid, cmp and EGFR June 25 or after  Please continue to reduce fried and fatty foods , sweets and carbs. Consciously try to eat more fruit and vegetable and drink water

## 2017-11-12 ENCOUNTER — Encounter: Payer: Self-pay | Admitting: Family Medicine

## 2017-11-12 MED ORDER — TRIAMTERENE-HCTZ 37.5-25 MG PO TABS: 1.0000 | ORAL_TABLET | Freq: Every day | ORAL | 2 refills | Status: DC

## 2017-11-12 MED ORDER — ATORVASTATIN CALCIUM 20 MG PO TABS: ORAL_TABLET | ORAL | 3 refills | Status: DC

## 2017-11-12 NOTE — Assessment & Plan Note (Signed)
Improved  Patient re-educated about  the importance of commitment to a  minimum of 150 minutes of exercise per week.   Weight /BMI 11/09/2017 05/11/2017 10/18/2016  WEIGHT 189 lb 191 lb 190 lb  HEIGHT 5\' 6"  5\' 6"  5\' 6"   BMI 30.51 kg/m2 30.83 kg/m2 30.67 kg/m2

## 2017-11-12 NOTE — Assessment & Plan Note (Signed)
Poor control, reports 4 to 5 hrs sleep. Sleep hygiene reviewed and written information offered also. Prescription sent for  medication needed.

## 2017-11-12 NOTE — Assessment & Plan Note (Signed)
Controlled , will continue current medication despite the fact that pt reports adverse s/e of hair loss and insomnia. She will call back for cardipology management when she decides .Regular exercise and weight loss strongly encouraged DASH diet and commitment to daily physical activity for a minimum of 30 minutes discussed and encouraged, as a part of hypertension management. The importance of attaining a healthy weight is also discussed.  BP/Weight 11/09/2017 05/11/2017 04/03/2017 10/18/2016 05/18/2016 11/29/2015 5/42/7062  Systolic BP 376 283 151 761 607 371 062  Diastolic BP 80 82 57 70 78 59 78  Wt. (Lbs) 189 191 - 190 182.04 187 184  BMI 30.51 30.83 - 30.67 29.38 30.2 29.71

## 2017-11-12 NOTE — Progress Notes (Signed)
Lori Sims     MRN: [id number]      DOB: 1960-06-16   HPI Lori Sims is here for follow up and re-evaluation of chronic medical conditions, medication management and review of any available recent lab and radiology data.  Preventive health is updated, specifically  Cancer screening and Immunization.  Needs pap and will schedule her appt There are no new concerns.  Continues to complain that her BP medication is contributing to her symptom of insomnia and hair loss. She has tried other meds, clonidine and amlodipine an has not been able to tolerate these either , is willing to consider cardiology evaluation and BP management and will get back in touch Reports zolpidem , though only affording 4 hours of sleep is the best she has been able to get since being on the bP medication and she will stay with it Has attempted weight loss to help BP but finds this very hard, no regular exercise, probably 2 times per week, no energy. Not using CPAP most of the time, reports recntly waking up snoring, states mostof the time she sleeps in a recliner and not in her bedroom  ROS Denies recent fever or chills. Denies sinus pressure, nasal congestion, ear pain or sore throat. Denies chest congestion, productive cough or wheezing. Denies chest pains, palpitations and leg swelling Denies abdominal pain, nausea, vomiting,diarrhea or constipation.   Denies dysuria, frequency, hesitancy or incontinence. Denies joint pain, swelling and limitation in mobility. Denies headaches, seizures, numbness, or tingling.  Denies skin break down or rash.   PE  BP 120/80   Pulse 70   Ht 5\' 6"  (1.676 m)   Wt 189 lb (85.7 kg)   LMP 08/26/2014   SpO2 98%   BMI 30.51 kg/m   Patient alert and oriented and in no cardiopulmonary distress.  HEENT: No facial asymmetry, EOMI,   oropharynx pink and moist.  Neck supple .  Chest: Clear to auscultation bilaterally.  CVS: S1, S2 no murmurs, no S3.Regular rate.  ABD: Soft  non tender.   Ext: No edema  MS: Adequate ROM spine, shoulders, hips and knees.  Skin: Intact, no ulcerations or rash noted.  Psych: Good eye contact, normal affect. Memory intact not anxious or depressed appearing.  CNS: CN 2-12 intact, .no focal deficits noted.   Assessment & Plan  Essential hypertension Controlled , will continue current medication despite the fact that pt reports adverse s/e of hair loss and insomnia. She will call back for cardipology management when she decides .Regular exercise and weight loss strongly encouraged DASH diet and commitment to daily physical activity for a minimum of 30 minutes discussed and encouraged, as a part of hypertension management. The importance of attaining a healthy weight is also discussed.  BP/Weight 11/09/2017 05/11/2017 04/03/2017 10/18/2016 05/18/2016 11/29/2015 1/76/1607  Systolic BP 371 062 694 854 627 035 009  Diastolic BP 80 82 57 70 78 59 78  Wt. (Lbs) 189 191 - 190 182.04 187 184  BMI 30.51 30.83 - 30.67 29.38 30.2 29.71       Hyperlipidemia Hyperlipidemia:Low fat diet discussed and encouraged.   Lipid Panel  Lab Results  Component Value Date   CHOL 179 06/05/2017   HDL 62 06/05/2017   LDLCALC 98 06/05/2017   TRIG 92 06/05/2017   CHOLHDL 2.9 06/05/2017  Updated lab needed at/ before next visit. Controlled when last tested     Obesity (BMI 30.0-34.9) Improved  Patient re-educated about  the importance of commitment to a  minimum of 150 minutes of exercise per week.   Weight /BMI 11/09/2017 05/11/2017 10/18/2016  WEIGHT 189 lb 191 lb 190 lb  HEIGHT 5\' 6"  5\' 6"  5\' 6"   BMI 30.51 kg/m2 30.83 kg/m2 30.67 kg/m2      Obstructive sleep apnea More regular compliance is encouraged   Insomnia Poor control, reports 4 to 5 hrs sleep. Sleep hygiene reviewed and written information offered also. Prescription sent for  medication needed.

## 2017-11-12 NOTE — Assessment & Plan Note (Signed)
More regular compliance is encouraged

## 2017-11-12 NOTE — Assessment & Plan Note (Signed)
Hyperlipidemia:Low fat diet discussed and encouraged.   Lipid Panel  Lab Results  Component Value Date   CHOL 179 06/05/2017   HDL 62 06/05/2017   LDLCALC 98 06/05/2017   TRIG 92 06/05/2017   CHOLHDL 2.9 06/05/2017  Updated lab needed at/ before next visit. Controlled when last tested

## 2017-11-27 ENCOUNTER — Encounter: Payer: Self-pay | Admitting: Family Medicine

## 2017-12-06 ENCOUNTER — Encounter: Payer: Self-pay | Admitting: Family Medicine

## 2017-12-07 ENCOUNTER — Other Ambulatory Visit: Payer: Self-pay | Admitting: Family Medicine

## 2017-12-07 DIAGNOSIS — I10 Essential (primary) hypertension: Secondary | ICD-10-CM

## 2017-12-29 ENCOUNTER — Encounter: Payer: Self-pay | Admitting: Family Medicine

## 2017-12-29 DIAGNOSIS — E782 Mixed hyperlipidemia: Secondary | ICD-10-CM | POA: Diagnosis not present

## 2017-12-29 DIAGNOSIS — I10 Essential (primary) hypertension: Secondary | ICD-10-CM | POA: Diagnosis not present

## 2017-12-29 LAB — COMPLETE METABOLIC PANEL WITH GFR
AG RATIO: 1.3 (calc) (ref 1.0–2.5)
ALBUMIN MSPROF: 4 g/dL (ref 3.6–5.1)
ALT: 18 U/L (ref 6–29)
AST: 19 U/L (ref 10–35)
Alkaline phosphatase (APISO): 84 U/L (ref 33–130)
BUN: 14 mg/dL (ref 7–25)
CALCIUM: 9.3 mg/dL (ref 8.6–10.4)
CO2: 32 mmol/L (ref 20–32)
CREATININE: 0.99 mg/dL (ref 0.50–1.05)
Chloride: 103 mmol/L (ref 98–110)
GFR, EST AFRICAN AMERICAN: 73 mL/min/{1.73_m2} (ref >=60)
GFR, EST NON AFRICAN AMERICAN: 63 mL/min/{1.73_m2} (ref >=60)
GLOBULIN: 3 g/dL (ref 1.9–3.7)
Glucose, Bld: 90 mg/dL (ref 65–99)
Potassium: 3.5 mmol/L (ref 3.5–5.3)
SODIUM: 142 mmol/L (ref 135–146)
TOTAL PROTEIN: 7 g/dL (ref 6.1–8.1)
Total Bilirubin: 0.4 mg/dL (ref 0.2–1.2)

## 2017-12-29 LAB — LIPID PANEL
CHOLESTEROL: 169 mg/dL (ref <200)
HDL: 52 mg/dL (ref >50)
LDL Cholesterol (Calc): 98 mg/dL (calc)
Non-HDL Cholesterol (Calc): 117 mg/dL (calc) (ref <130)
Total CHOL/HDL Ratio: 3.3 (calc) (ref <5.0)
Triglycerides: 99 mg/dL (ref <150)

## 2018-01-09 ENCOUNTER — Encounter: Payer: Self-pay | Admitting: Cardiology

## 2018-01-18 ENCOUNTER — Ambulatory Visit: Payer: BLUE CROSS/BLUE SHIELD | Admitting: Cardiology

## 2018-01-29 ENCOUNTER — Encounter: Payer: Self-pay | Admitting: Cardiology

## 2018-01-29 ENCOUNTER — Ambulatory Visit: Payer: BLUE CROSS/BLUE SHIELD | Admitting: Cardiology

## 2018-01-29 VITALS — BP 128/72 | HR 86 | Ht 66.0 in | Wt 184.0 lb

## 2018-01-29 DIAGNOSIS — I10 Essential (primary) hypertension: Secondary | ICD-10-CM | POA: Diagnosis not present

## 2018-01-29 DIAGNOSIS — Z79899 Other long term (current) drug therapy: Secondary | ICD-10-CM

## 2018-01-29 MED ORDER — LISINOPRIL 20 MG PO TABS: 20.0000 mg | ORAL_TABLET | Freq: Every day | ORAL | 3 refills | Status: DC

## 2018-01-29 NOTE — Progress Notes (Signed)
Clinical Summary Lori Sims is a 57 y.o.female seen as new consult, referred by Dr Moshe Cipro for HTN.   1. HTN - notes indicate she has felt her bp meds have contributed to insomnia and ahir loss.  - has not been able to tolerate clonidine or amlodopine in the past. Norvasc caused leg swelling, chest pain. She does not recall the side effects to clonidien.  - currently on maxide only. - has had some chronic issues with hypokalemia, on chronic replacement.     Past Medical History:  Diagnosis Date  . GERD (gastroesophageal reflux disease)   . Hypercholesterolemia   . Hypertension      Allergies  Allergen Reactions  . Penicillins   . Tramadol Nausea And Vomiting     Current Outpatient Medications  Medication Sig Dispense Refill  . atorvastatin (LIPITOR) 20 MG tablet One tablet by mouth three times weekly 36 tablet 3  . fluticasone (FLONASE) 50 MCG/ACT nasal spray Place 2 sprays into both nostrils daily. 16 g 5  . loratadine (CLARITIN) 10 MG tablet Take 1 tablet (10 mg total) by mouth daily. 30 tablet 5  . Multiple Vitamin (MULTIVITAMIN) tablet Take 1 tablet by mouth daily.    . Potassium 99 MG TABS Take by mouth. 1 tab daily    . triamterene-hydrochlorothiazide (MAXZIDE-25) 37.5-25 MG tablet Take 1 tablet by mouth daily. 90 tablet 2  . zolpidem (AMBIEN) 10 MG tablet Take 1 tablet (10 mg total) by mouth at bedtime. 30 tablet 5   No current facility-administered medications for this visit.      Past Surgical History:  Procedure Laterality Date  . CESAREAN SECTION    . COLONOSCOPY  2010   Dr. Laural Golden: normal except external hemorrhoids  . COLONOSCOPY N/A 09/12/2014   Procedure: COLONOSCOPY;  Surgeon: Danie Binder, MD;  Location: [facility];  Service: Endoscopy;  Laterality: N/A;  1030   . ESOPHAGEAL DILATION N/A 09/12/2014   Procedure: ESOPHAGEAL DILATION;  Surgeon: Danie Binder, MD;  Location: [facility];  Service: Endoscopy;  Laterality: N/A;  .  ESOPHAGOGASTRODUODENOSCOPY N/A 09/12/2014   Procedure: ESOPHAGOGASTRODUODENOSCOPY (EGD);  Surgeon: Danie Binder, MD;  Location: [facility];  Service: Endoscopy;  Laterality: N/A;  . fibroid tumor right breast    . fibroid tumors left breast    . OVARIAN CYST DRAINAGE    . OVARIAN CYST REMOVAL       Allergies  Allergen Reactions  . Penicillins   . Tramadol Nausea And Vomiting      Family History  Problem Relation Age of Onset  . Diabetes Mother   . Hypertension Father   . Colon cancer Neg Hx      Social History Ms. Deland reports that she has never smoked. She has never used smokeless tobacco. Ms. Mcmackin reports that she drinks alcohol.   Review of Systems CONSTITUTIONAL: No weight loss, fever, chills, weakness or fatigue.  HEENT: Eyes: No visual loss, blurred vision, double vision or yellow sclerae.No hearing loss, sneezing, congestion, runny nose or sore throat.  SKIN: No rash or itching.  CARDIOVASCULAR: no chest pain, no palpitations.  RESPIRATORY: No shortness of breath, cough or sputum.  GASTROINTESTINAL: No anorexia, nausea, vomiting or diarrhea. No abdominal pain or blood.  GENITOURINARY: No burning on urination, no polyuria NEUROLOGICAL: No headache, dizziness, syncope, paralysis, ataxia, numbness or tingling in the extremities. No change in bowel or bladder control.  MUSCULOSKELETAL: No muscle, back pain, joint pain or stiffness.  LYMPHATICS: No enlarged nodes. No history of splenectomy.  PSYCHIATRIC: No history of depression or anxiety.  ENDOCRINOLOGIC: No reports of sweating, cold or heat intolerance. No polyuria or polydipsia.  Marland Kitchen   Physical Examination Vitals:   01/29/18 0842 01/29/18 0843  BP: 124/72 128/72  Pulse: 86   SpO2: 98%    Vitals:   01/29/18 0842  Weight: 184 lb (83.5 kg)  Height: 5\' 6"  (1.676 m)    Gen: resting comfortably, no acute distress HEENT: no scleral icterus, pupils equal round and reactive, no palptable cervical  adenopathy,  CV: RRR, no m/r/g, no jvd Resp: Clear to auscultation bilaterally GI: abdomen is soft, non-tender, non-distended, normal bowel sounds, no hepatosplenomegaly MSK: extremities are warm, no edema.  Skin: warm, no rash Neuro:  no focal deficits Psych: appropriate affect     Assessment and Plan  1. HTN - controlled, however appears to be having side effects on maxide with insomnia and hair loss. Also some issues with chronic low K. Side effects on norvasc and clonidine previously - we will d/c maxzide and potassium, start lisinopril 20mg  daily. Check BMET/Mg in 2 weeks. She will submit bp log in 2 weeks  EKG today shows SR, nonspecific ST/T changes   F/u pending. If tolerated lisinopril and stable labs can f/u just as needed.    Arnoldo Lenis, M.D.

## 2018-01-29 NOTE — Patient Instructions (Addendum)
Medication Instructions:  STOP MAXZIDE STOP POTASSIUM  START LISINOPRIL 20 MG DAILY   Labwork:2 WEEKS  BMET MAGNESIUM   Testing/Procedures: NONE  Follow-Up: Your physician recommends that you schedule a follow-up appointment in: PENDING    Any Other Special Instructions Will Be Listed Below (If Applicable).  PLEASE KEEP A BLOOD PRESSURE LOG FOR TWO WEEKS, PLEASE CALL WITH READINGS, FAX OR DROP THEM OFF FOR DR. BRANCH TO REVIEW.   [phone]    If you need a refill on your cardiac medications before your next appointment, please call your pharmacy.   DASH Eating Plan DASH stands for "Dietary Approaches to Stop Hypertension." The DASH eating plan is a healthy eating plan that has been shown to reduce high blood pressure (hypertension). It may also reduce your risk for type 2 diabetes, heart disease, and stroke. The DASH eating plan may also help with weight loss. What are tips for following this plan? General guidelines  Avoid eating more than 2,300 mg (milligrams) of salt (sodium) a day. If you have hypertension, you may need to reduce your sodium intake to 1,500 mg a day.  Limit alcohol intake to no more than 1 drink a day for nonpregnant women and 2 drinks a day for men. One drink equals 12 oz of beer, 5 oz of wine, or 1 oz of hard liquor.  Work with your health care provider to maintain a healthy body weight or to lose weight. Ask what an ideal weight is for you.  Get at least 30 minutes of exercise that causes your heart to beat faster (aerobic exercise) most days of the week. Activities may include walking, swimming, or biking.  Work with your health care provider or diet and nutrition specialist (dietitian) to adjust your eating plan to your individual calorie needs. Reading food labels  Check food labels for the amount of sodium per serving. Choose foods with less than 5 percent of the Daily Value of sodium. Generally, foods with less than 300 mg of sodium  per serving fit into this eating plan.  To find whole grains, look for the word "whole" as the first word in the ingredient list. Shopping  Buy products labeled as "low-sodium" or "no salt added."  Buy fresh foods. Avoid canned foods and premade or frozen meals. Cooking  Avoid adding salt when cooking. Use salt-free seasonings or herbs instead of table salt or sea salt. Check with your health care provider or pharmacist before using salt substitutes.  Do not fry foods. Cook foods using healthy methods such as baking, boiling, grilling, and broiling instead.  Cook with heart-healthy oils, such as olive, canola, soybean, or sunflower oil. Meal planning   Eat a balanced diet that includes: ? 5 or more servings of fruits and vegetables each day. At each meal, try to fill half of your plate with fruits and vegetables. ? Up to 6-8 servings of whole grains each day. ? Less than 6 oz of lean meat, poultry, or fish each day. A 3-oz serving of meat is about the same size as a deck of cards. One egg equals 1 oz. ? 2 servings of low-fat dairy each day. ? A serving of nuts, seeds, or beans 5 times each week. ? Heart-healthy fats. Healthy fats called Omega-3 fatty acids are found in foods such as flaxseeds and coldwater fish, like sardines, salmon, and mackerel.  Limit how much you eat of the following: ? Canned or prepackaged foods. ? Food that is high in trans fat,  such as fried foods. ? Food that is high in saturated fat, such as fatty meat. ? Sweets, desserts, sugary drinks, and other foods with added sugar. ? Full-fat dairy products.  Do not salt foods before eating.  Try to eat at least 2 vegetarian meals each week.  Eat more home-cooked food and less restaurant, buffet, and fast food.  When eating at a restaurant, ask that your food be prepared with less salt or no salt, if possible. What foods are recommended? The items listed may not be a complete list. Talk with your dietitian  about what dietary choices are best for you. Grains Whole-grain or whole-wheat bread. Whole-grain or whole-wheat pasta. Brown rice. [place]. Bulgur. Whole-grain and low-sodium cereals. Pita bread. Low-fat, low-sodium crackers. Whole-wheat flour tortillas. Vegetables Fresh or frozen vegetables (raw, steamed, roasted, or grilled). Low-sodium or reduced-sodium tomato and vegetable juice. Low-sodium or reduced-sodium tomato sauce and tomato paste. Low-sodium or reduced-sodium canned vegetables. Fruits All fresh, dried, or frozen fruit. Canned fruit in natural juice (without added sugar). Meat and other protein foods Skinless chicken or [place]. Ground chicken or [place]. Pork with fat trimmed off. Fish and seafood. Egg whites. Dried beans, peas, or lentils. Unsalted nuts, nut butters, and seeds. Unsalted canned beans. Lean cuts of beef with fat trimmed off. Low-sodium, lean deli meat. Dairy Low-fat (1%) or fat-free (skim) milk. Fat-free, low-fat, or reduced-fat cheeses. Nonfat, low-sodium ricotta or cottage cheese. Low-fat or nonfat yogurt. Low-fat, low-sodium cheese. Fats and oils Soft margarine without trans fats. Vegetable oil. Low-fat, reduced-fat, or light mayonnaise and salad dressings (reduced-sodium). Canola, safflower, olive, soybean, and sunflower oils. Avocado. Seasoning and other foods Herbs. Spices. Seasoning mixes without salt. Unsalted popcorn and pretzels. Fat-free sweets. What foods are not recommended? The items listed may not be a complete list. Talk with your dietitian about what dietary choices are best for you. Grains Baked goods made with fat, such as croissants, muffins, or some breads. Dry pasta or rice meal packs. Vegetables Creamed or fried vegetables. Vegetables in a cheese sauce. Regular canned vegetables (not low-sodium or reduced-sodium). Regular canned tomato sauce and paste (not low-sodium or reduced-sodium). Regular tomato and vegetable juice (not low-sodium or  reduced-sodium). Angie Fava. Olives. Fruits Canned fruit in a light or heavy syrup. Fried fruit. Fruit in cream or butter sauce. Meat and other protein foods Fatty cuts of meat. Ribs. Fried meat. Berniece Salines. Sausage. Bologna and other processed lunch meats. Salami. Fatback. Hotdogs. Bratwurst. Salted nuts and seeds. Canned beans with added salt. Canned or smoked fish. Whole eggs or egg yolks. Chicken or [place] with skin. Dairy Whole or 2% milk, cream, and half-and-half. Whole or full-fat cream cheese. Whole-fat or sweetened yogurt. Full-fat cheese. Nondairy creamers. Whipped toppings. Processed cheese and cheese spreads. Fats and oils Butter. Stick margarine. Lard. Shortening. Ghee. Bacon fat. Tropical oils, such as coconut, palm kernel, or palm oil. Seasoning and other foods Salted popcorn and pretzels. Onion salt, garlic salt, seasoned salt, table salt, and sea salt. Worcestershire sauce. Tartar sauce. Barbecue sauce. Teriyaki sauce. Soy sauce, including reduced-sodium. Steak sauce. Canned and packaged gravies. Fish sauce. Oyster sauce. Cocktail sauce. Horseradish that you find on the shelf. Ketchup. Mustard. Meat flavorings and tenderizers. Bouillon cubes. Hot sauce and Tabasco sauce. Premade or packaged marinades. Premade or packaged taco seasonings. Relishes. Regular salad dressings. Where to find more information:  National Heart, Lung, and [place]: https://wilson-eaton.com/  American Heart Association: www.heart.org Summary  The DASH eating plan is a healthy eating plan that has been  shown to reduce high blood pressure (hypertension). It may also reduce your risk for type 2 diabetes, heart disease, and stroke.  With the DASH eating plan, you should limit salt (sodium) intake to 2,300 mg a day. If you have hypertension, you may need to reduce your sodium intake to 1,500 mg a day.  When on the DASH eating plan, aim to eat more fresh fruits and vegetables, whole grains, lean proteins, low-fat  dairy, and heart-healthy fats.  Work with your health care provider or diet and nutrition specialist (dietitian) to adjust your eating plan to your individual calorie needs. This information is not intended to replace advice given to you by your health care provider. Make sure you discuss any questions you have with your health care provider. Document Released: 05/19/2011 Document Revised: 05/23/2016 Document Reviewed: 05/23/2016 Elsevier Interactive Patient Education  [employer].

## 2018-02-13 DIAGNOSIS — Z79899 Other long term (current) drug therapy: Secondary | ICD-10-CM | POA: Diagnosis not present

## 2018-02-14 LAB — BASIC METABOLIC PANEL
BUN/Creatinine Ratio: 10 (calc) (ref 6–22)
BUN: 11 mg/dL (ref 7–25)
CALCIUM: 9.3 mg/dL (ref 8.6–10.4)
CHLORIDE: 108 mmol/L (ref 98–110)
CO2: 31 mmol/L (ref 20–32)
Creat: 1.14 mg/dL — ABNORMAL HIGH (ref 0.50–1.05)
Glucose, Bld: 96 mg/dL (ref 65–99)
POTASSIUM: 4.2 mmol/L (ref 3.5–5.3)
SODIUM: 145 mmol/L (ref 135–146)

## 2018-02-14 LAB — MAGNESIUM: Magnesium: 1.8 mg/dL (ref 1.5–2.5)

## 2018-02-15 ENCOUNTER — Telehealth: Payer: Self-pay

## 2018-02-15 NOTE — Telephone Encounter (Signed)
-----   Message from Arnoldo Lenis, MD sent at 02/15/2018 12:55 PM EDT ----- Labs look ok. How are her bp's doing since we changed her meds last visit   Zandra Abts MD

## 2018-02-15 NOTE — Telephone Encounter (Signed)
Called pt. Non answer. Left message for pt to return call.

## 2018-02-15 NOTE — Telephone Encounter (Signed)
Pt notified. She dropped off bp log for Dr. Harl Bowie to review. He has been in [place], will show him Friday when he comes to [place] office.

## 2018-02-19 ENCOUNTER — Other Ambulatory Visit: Payer: Self-pay

## 2018-02-19 DIAGNOSIS — Z79899 Other long term (current) drug therapy: Secondary | ICD-10-CM

## 2018-02-19 MED ORDER — LISINOPRIL 40 MG PO TABS: 40.0000 mg | ORAL_TABLET | Freq: Every day | ORAL | 3 refills | Status: DC

## 2018-02-22 ENCOUNTER — Other Ambulatory Visit: Payer: Self-pay | Admitting: Family Medicine

## 2018-02-22 DIAGNOSIS — Z1231 Encounter for screening mammogram for malignant neoplasm of breast: Secondary | ICD-10-CM

## 2018-03-12 DIAGNOSIS — Z01419 Encounter for gynecological examination (general) (routine) without abnormal findings: Secondary | ICD-10-CM | POA: Diagnosis not present

## 2018-03-12 DIAGNOSIS — Z1151 Encounter for screening for human papillomavirus (HPV): Secondary | ICD-10-CM | POA: Diagnosis not present

## 2018-03-12 DIAGNOSIS — Z6829 Body mass index (BMI) 29.0-29.9, adult: Secondary | ICD-10-CM | POA: Diagnosis not present

## 2018-03-12 DIAGNOSIS — Z79899 Other long term (current) drug therapy: Secondary | ICD-10-CM | POA: Diagnosis not present

## 2018-03-12 LAB — BASIC METABOLIC PANEL
BUN/Creatinine Ratio: 14 (calc) (ref 6–22)
BUN: 16 mg/dL (ref 7–25)
CALCIUM: 9.4 mg/dL (ref 8.6–10.4)
CHLORIDE: 107 mmol/L (ref 98–110)
CO2: 28 mmol/L (ref 20–32)
Creat: 1.13 mg/dL — ABNORMAL HIGH (ref 0.50–1.05)
Glucose, Bld: 104 mg/dL — ABNORMAL HIGH (ref 65–99)
Potassium: 3.8 mmol/L (ref 3.5–5.3)
Sodium: 144 mmol/L (ref 135–146)

## 2018-03-14 ENCOUNTER — Other Ambulatory Visit (HOSPITAL_COMMUNITY): Payer: Self-pay | Admitting: Obstetrics and Gynecology

## 2018-03-14 DIAGNOSIS — M79621 Pain in right upper arm: Secondary | ICD-10-CM

## 2018-03-27 ENCOUNTER — Ambulatory Visit (HOSPITAL_COMMUNITY)
Admission: RE | Admit: 2018-03-27 | Discharge: 2018-03-27 | Disposition: A | Discharge status: Home / Self Care | Payer: BLUE CROSS/BLUE SHIELD | Source: Ambulatory Visit | Attending: Obstetrics and Gynecology | Admitting: Obstetrics and Gynecology

## 2018-03-27 ENCOUNTER — Ambulatory Visit (HOSPITAL_COMMUNITY): Payer: BLUE CROSS/BLUE SHIELD

## 2018-03-27 DIAGNOSIS — M79621 Pain in right upper arm: Secondary | ICD-10-CM | POA: Diagnosis not present

## 2018-03-27 DIAGNOSIS — R922 Inconclusive mammogram: Secondary | ICD-10-CM | POA: Diagnosis not present

## 2018-03-27 DIAGNOSIS — N644 Mastodynia: Secondary | ICD-10-CM | POA: Diagnosis not present

## 2018-04-02 ENCOUNTER — Ambulatory Visit (HOSPITAL_COMMUNITY): Payer: BLUE CROSS/BLUE SHIELD

## 2018-04-16 DIAGNOSIS — R87612 Low grade squamous intraepithelial lesion on cytologic smear of cervix (LGSIL): Secondary | ICD-10-CM | POA: Diagnosis not present

## 2018-04-16 DIAGNOSIS — N760 Acute vaginitis: Secondary | ICD-10-CM | POA: Diagnosis not present

## 2018-05-17 ENCOUNTER — Other Ambulatory Visit: Payer: Self-pay

## 2018-05-17 DIAGNOSIS — F5104 Psychophysiologic insomnia: Secondary | ICD-10-CM

## 2018-05-17 MED ORDER — ZOLPIDEM TARTRATE 10 MG PO TABS: 10.0000 mg | ORAL_TABLET | Freq: Every day | ORAL | 5 refills | Status: DC

## 2018-05-17 NOTE — Progress Notes (Signed)
Sent request for Dr. To sign refill on Ambien

## 2018-06-20 ENCOUNTER — Telehealth: Payer: Self-pay | Admitting: Cardiology

## 2018-06-20 DIAGNOSIS — Z79899 Other long term (current) drug therapy: Secondary | ICD-10-CM

## 2018-06-20 NOTE — Telephone Encounter (Signed)
Per pt phone call-- she's not been feeling well, she's having headaches, BP problems and problems w/ her vision since starting her medications back in August 2019.   Please give pt a call.

## 2018-06-20 NOTE — Telephone Encounter (Signed)
Returned pt call. She states that for the past 3 weeks, she hs been having a lot of headaches &  blurry vision. She states that she has been taking her blood pressure every morning about an hour or so after taking her medication and it has started running high. (164/96, 156/98, 146/90,155/95) are a few of the readings she has written down. She also takes her blood pressure in the evening if she feels bad and it has also been staying higher than normal for her.She has not changed the way she eats. She has actually started working out several days per week since November.  She asks if there is another medication she could try. Please advise.

## 2018-06-21 NOTE — Telephone Encounter (Signed)
Called pt. No answer, left message for pt to return call.  

## 2018-06-21 NOTE — Telephone Encounter (Signed)
BP's are high but not high enough to really cause symptoms. For headaches and blurry visions from high bp's typically would expect bps in 180s or higher. She should see her pcp to discuss other causes of her headaches and blurry vision. To help lower bp will need to start an additional medication, since she has had some side effects to a few others I would start aldactone 25mg  daily, needs BMET in 2 weeks and update [place] on bp's in 1 week   Zandra Abts MD

## 2018-06-22 MED ORDER — SPIRONOLACTONE 25 MG PO TABS: 25.0000 mg | ORAL_TABLET | Freq: Every day | ORAL | 3 refills | Status: DC

## 2018-06-22 NOTE — Telephone Encounter (Signed)
Called pt. No answer, left message for pt to return call.  

## 2018-06-22 NOTE — Telephone Encounter (Signed)
Spoke with pt. Advised her of medication changes, mailed lab slips.

## 2018-07-04 ENCOUNTER — Ambulatory Visit: Payer: BLUE CROSS/BLUE SHIELD | Admitting: Family Medicine

## 2018-07-04 ENCOUNTER — Encounter: Payer: Self-pay | Admitting: Family Medicine

## 2018-07-04 VITALS — BP 110/72 | HR 82 | Resp 10 | Ht 66.0 in | Wt 179.0 lb

## 2018-07-04 DIAGNOSIS — Z23 Encounter for immunization: Secondary | ICD-10-CM | POA: Diagnosis not present

## 2018-07-04 DIAGNOSIS — I10 Essential (primary) hypertension: Secondary | ICD-10-CM

## 2018-07-04 DIAGNOSIS — E663 Overweight: Secondary | ICD-10-CM

## 2018-07-04 DIAGNOSIS — E7849 Other hyperlipidemia: Secondary | ICD-10-CM

## 2018-07-04 DIAGNOSIS — E8881 Metabolic syndrome: Secondary | ICD-10-CM

## 2018-07-04 DIAGNOSIS — F5104 Psychophysiologic insomnia: Secondary | ICD-10-CM

## 2018-07-04 MED ORDER — ZOLPIDEM TARTRATE 10 MG PO TABS: 10.0000 mg | ORAL_TABLET | Freq: Every day | ORAL | 5 refills | Status: DC

## 2018-07-04 NOTE — Patient Instructions (Signed)
F/u in 6 months, with shingrix,#1, call if you need me sooner  TdAP today  Congrats on starting regular exercise ,, and continuing to practice "mindful eating"( a lot of fruit and vegetable)  No medication changes , and I am thanklful your bP med is working well for you  CBC, lipid, cmp and eGFr, tSH and hBA1C today, I will send result vis My chart and ask you to confirm

## 2018-07-08 DIAGNOSIS — Z23 Encounter for immunization: Secondary | ICD-10-CM | POA: Insufficient documentation

## 2018-07-08 NOTE — Assessment & Plan Note (Signed)
Sleep hygiene reviewed and written information offered also. Prescription sent for  medication needed.  

## 2018-07-08 NOTE — Assessment & Plan Note (Signed)
Improved, she is applauded by changes for improved health. Patient re-educated about  the importance of commitment to a  minimum of 150 minutes of exercise per week.  The importance of healthy food choices with portion control discussed. Encouraged to start a food diary, count calories and to consider  joining a support group. Sample diet sheets offered. Goals set by the patient for the next several months.   Weight /BMI 07/04/2018 01/29/2018 11/09/2017  WEIGHT 179 lb 184 lb 189 lb  HEIGHT 5\' 6"  5\' 6"  5\' 6"   BMI 28.89 kg/m2 29.7 kg/m2 30.51 kg/m2

## 2018-07-08 NOTE — Progress Notes (Signed)
Lori Sims     MRN: [id number]      DOB: 05-27-1961   HPI Lori Sims is here for follow up and re-evaluation of chronic medical conditions, medication management and review of any available recent lab and radiology data.  Preventive health is updated, specifically  Cancer screening and Immunization.   Questions or concerns regarding consultations or procedures which the PT has had in the interim are  addressed. The PT denies any adverse reactions to current medications since the last visit.  There are no new concerns.  There are no specific complaints   ROS Denies recent fever or chills. Denies sinus pressure, nasal congestion, ear pain or sore throat. Denies chest congestion, productive cough or wheezing. Denies chest pains, palpitations and leg swelling Denies abdominal pain, nausea, vomiting,diarrhea or constipation.   Denies dysuria, frequency, hesitancy or incontinence. Denies joint pain, swelling and limitation in mobility. Denies headaches, seizures, numbness, or tingling. Denies depression, anxiety or insomnia. Denies skin break down or rash.   PE  BP 110/72   Pulse 82   Resp 10   Ht 5\' 6"  (1.676 m)   Wt 179 lb (81.2 kg)   LMP 08/26/2014   SpO2 99% Comment: room air  BMI 28.89 kg/m   Patient alert and oriented and in no cardiopulmonary distress.  HEENT: No facial asymmetry, EOMI,   oropharynx pink and moist.  Neck supple no JVD, no mass.  Chest: Clear to auscultation bilaterally.  CVS: S1, S2 no murmurs, no S3.Regular rate.  ABD: Soft non tender.   Ext: No edema  MS: Adequate ROM spine, shoulders, hips and knees.  Skin: Intact, no ulcerations or rash noted.  Psych: Good eye contact, normal affect. Memory intact not anxious or depressed appearing.  CNS: CN 2-12 intact, power,  normal throughout.no focal deficits noted.   Assessment & Plan  Hyperlipidemia Hyperlipidemia:Low fat diet discussed and encouraged.   Lipid Panel  Lab Results    Component Value Date   CHOL 169 12/29/2017   HDL 52 12/29/2017   LDLCALC 98 12/29/2017   TRIG 99 12/29/2017   CHOLHDL 3.3 12/29/2017   Updated lab needed at/ before next visit. Controlled,when last tested   Essential hypertension Controlled, no change in medication DASH diet and commitment to daily physical activity for a minimum of 30 minutes discussed and encouraged, as a part of hypertension management. The importance of attaining a healthy weight is also discussed.  BP/Weight 07/04/2018 01/29/2018 11/09/2017 05/11/2017 04/03/2017 10/18/2016 03/13/7509  Systolic BP 258 527 782 423 536 144 315  Diastolic BP 72 72 80 82 57 70 78  Wt. (Lbs) 179 184 189 191 - 190 182.04  BMI 28.89 29.7 30.51 30.83 - 30.67 29.38       Overweight (BMI 25.0-29.9) Improved, she is applauded by changes for improved health. Patient re-educated about  the importance of commitment to a  minimum of 150 minutes of exercise per week.  The importance of healthy food choices with portion control discussed. Encouraged to start a food diary, count calories and to consider  joining a support group. Sample diet sheets offered. Goals set by the patient for the next several months.   Weight /BMI 07/04/2018 01/29/2018 11/09/2017  WEIGHT 179 lb 184 lb 189 lb  HEIGHT 5\' 6"  5\' 6"  5\' 6"   BMI 28.89 kg/m2 29.7 kg/m2 30.51 kg/m2      Need for Tdap vaccination After obtaining informed consent, the vaccine is  administered with no immediate adverse s/e.  Insomnia Sleep hygiene reviewed and written information offered also. Prescription sent for  medication needed.

## 2018-07-08 NOTE — Assessment & Plan Note (Signed)
Hyperlipidemia:Low fat diet discussed and encouraged.   Lipid Panel  Lab Results  Component Value Date   CHOL 169 12/29/2017   HDL 52 12/29/2017   LDLCALC 98 12/29/2017   TRIG 99 12/29/2017   CHOLHDL 3.3 12/29/2017   Updated lab needed at/ before next visit. Controlled,when last tested

## 2018-07-08 NOTE — Assessment & Plan Note (Signed)
Controlled, no change in medication DASH diet and commitment to daily physical activity for a minimum of 30 minutes discussed and encouraged, as a part of hypertension management. The importance of attaining a healthy weight is also discussed.  BP/Weight 07/04/2018 01/29/2018 11/09/2017 05/11/2017 04/03/2017 10/18/2016 89/12/9148  Systolic BP 413 643 837 793 968 864 847  Diastolic BP 72 72 80 82 57 70 78  Wt. (Lbs) 179 184 189 191 - 190 182.04  BMI 28.89 29.7 30.51 30.83 - 30.67 29.38

## 2018-07-08 NOTE — Assessment & Plan Note (Signed)
After obtaining informed consent, the vaccine is  administered with no immediate adverse s/e.

## 2018-07-13 ENCOUNTER — Ambulatory Visit: Payer: BLUE CROSS/BLUE SHIELD | Admitting: Cardiology

## 2018-07-16 DIAGNOSIS — E7849 Other hyperlipidemia: Secondary | ICD-10-CM | POA: Diagnosis not present

## 2018-07-16 DIAGNOSIS — E8881 Metabolic syndrome: Secondary | ICD-10-CM | POA: Diagnosis not present

## 2018-07-16 DIAGNOSIS — I10 Essential (primary) hypertension: Secondary | ICD-10-CM | POA: Diagnosis not present

## 2018-07-17 ENCOUNTER — Encounter: Payer: Self-pay | Admitting: Family Medicine

## 2018-07-17 LAB — COMPLETE METABOLIC PANEL WITH GFR
AG Ratio: 1.6 (calc) (ref 1.0–2.5)
ALBUMIN MSPROF: 4.2 g/dL (ref 3.6–5.1)
ALT: 12 U/L (ref 6–29)
AST: 17 U/L (ref 10–35)
Alkaline phosphatase (APISO): 73 U/L (ref 37–153)
BILIRUBIN TOTAL: 0.2 mg/dL (ref 0.2–1.2)
BUN/Creatinine Ratio: 11 (calc) (ref 6–22)
BUN: 12 mg/dL (ref 7–25)
CALCIUM: 9.5 mg/dL (ref 8.6–10.4)
CHLORIDE: 106 mmol/L (ref 98–110)
CO2: 29 mmol/L (ref 20–32)
Creat: 1.06 mg/dL — ABNORMAL HIGH (ref 0.50–1.05)
GFR, EST AFRICAN AMERICAN: 67 mL/min/{1.73_m2} (ref >=60)
GFR, Est Non African American: 58 mL/min/{1.73_m2} — ABNORMAL LOW (ref >=60)
GLUCOSE: 87 mg/dL (ref 65–99)
Globulin: 2.7 g/dL (calc) (ref 1.9–3.7)
POTASSIUM: 3.7 mmol/L (ref 3.5–5.3)
Sodium: 144 mmol/L (ref 135–146)
TOTAL PROTEIN: 6.9 g/dL (ref 6.1–8.1)

## 2018-07-17 LAB — TSH: TSH: 1.72 mIU/L (ref 0.40–4.50)

## 2018-07-17 LAB — CBC
HEMATOCRIT: 35.2 % (ref 35.0–45.0)
Hemoglobin: 11.8 g/dL (ref 11.7–15.5)
MCH: 30.3 pg (ref 27.0–33.0)
MCHC: 33.5 g/dL (ref 32.0–36.0)
MCV: 90.5 fL (ref 80.0–100.0)
MPV: 10.8 fL (ref 7.5–12.5)
PLATELETS: 278 10*3/uL (ref 140–400)
RBC: 3.89 10*6/uL (ref 3.80–5.10)
RDW: 12.5 % (ref 11.0–15.0)
WBC: 7.2 10*3/uL (ref 3.8–10.8)

## 2018-07-17 LAB — HEMOGLOBIN A1C
EAG (MMOL/L): 6.5 (calc)
Hgb A1c MFr Bld: 5.7 % of total Hgb — ABNORMAL HIGH (ref <5.7)
Mean Plasma Glucose: 117 (calc)

## 2018-07-17 LAB — LIPID PANEL
Cholesterol: 173 mg/dL (ref <200)
HDL: 56 mg/dL (ref >=50)
LDL Cholesterol (Calc): 96 mg/dL (calc)
NON-HDL CHOLESTEROL (CALC): 117 mg/dL (ref <130)
Total CHOL/HDL Ratio: 3.1 (calc) (ref <5.0)
Triglycerides: 112 mg/dL (ref <150)

## 2018-08-30 ENCOUNTER — Telehealth: Payer: Self-pay

## 2018-08-30 DIAGNOSIS — I10 Essential (primary) hypertension: Secondary | ICD-10-CM

## 2018-08-30 DIAGNOSIS — R7301 Impaired fasting glucose: Secondary | ICD-10-CM

## 2018-08-30 DIAGNOSIS — E7849 Other hyperlipidemia: Secondary | ICD-10-CM

## 2018-08-30 NOTE — Telephone Encounter (Signed)
-----  Message from Fayrene Helper, MD sent at 07/17/2018  8:03 AM EST ----- Regarding: pls see result note and mail fasting lipid, cmp and eGFR and hBA1C to be done first week in july, tx

## 2018-10-24 ENCOUNTER — Other Ambulatory Visit: Payer: Self-pay | Admitting: Family Medicine

## 2018-10-24 MED ORDER — ATORVASTATIN CALCIUM 20 MG PO TABS: ORAL_TABLET | ORAL | 3 refills | Status: DC

## 2018-10-24 NOTE — Progress Notes (Signed)
lipi

## 2018-12-10 ENCOUNTER — Encounter: Payer: Self-pay | Admitting: Family Medicine

## 2018-12-10 DIAGNOSIS — R7301 Impaired fasting glucose: Secondary | ICD-10-CM | POA: Diagnosis not present

## 2018-12-10 DIAGNOSIS — I10 Essential (primary) hypertension: Secondary | ICD-10-CM | POA: Diagnosis not present

## 2018-12-10 DIAGNOSIS — E7849 Other hyperlipidemia: Secondary | ICD-10-CM | POA: Diagnosis not present

## 2018-12-11 ENCOUNTER — Encounter: Payer: Self-pay | Admitting: Family Medicine

## 2018-12-11 LAB — COMPLETE METABOLIC PANEL WITH GFR
AG RATIO: 1.5 (calc) (ref 1.0–2.5)
ALT: 12 U/L (ref 6–29)
AST: 18 U/L (ref 10–35)
Albumin: 4.6 g/dL (ref 3.6–5.1)
Alkaline phosphatase (APISO): 67 U/L (ref 37–153)
BILIRUBIN TOTAL: 0.3 mg/dL (ref 0.2–1.2)
BUN: 14 mg/dL (ref 7–25)
CHLORIDE: 105 mmol/L (ref 98–110)
CO2: 29 mmol/L (ref 20–32)
CREATININE: 1.05 mg/dL (ref 0.50–1.05)
Calcium: 9.9 mg/dL (ref 8.6–10.4)
GFR, EST AFRICAN AMERICAN: 68 mL/min/{1.73_m2} (ref >=60)
GFR, Est Non African American: 58 mL/min/{1.73_m2} — ABNORMAL LOW (ref >=60)
GLUCOSE: 81 mg/dL (ref 65–139)
Globulin: 3 g/dL (calc) (ref 1.9–3.7)
POTASSIUM: 4.1 mmol/L (ref 3.5–5.3)
Sodium: 142 mmol/L (ref 135–146)
TOTAL PROTEIN: 7.6 g/dL (ref 6.1–8.1)

## 2018-12-11 LAB — LIPID PANEL
CHOL/HDL RATIO: 3.4 (calc) (ref <5.0)
CHOLESTEROL: 202 mg/dL — AB (ref <200)
HDL: 60 mg/dL (ref >=50)
LDL Cholesterol (Calc): 117 mg/dL (calc) — ABNORMAL HIGH
NON-HDL CHOLESTEROL (CALC): 142 mg/dL — AB (ref <130)
Triglycerides: 131 mg/dL (ref <150)

## 2018-12-11 LAB — HEMOGLOBIN A1C
HEMOGLOBIN A1C: 5.4 %{Hb} (ref <5.7)
Mean Plasma Glucose: 108 (calc)
eAG (mmol/L): 6 (calc)

## 2018-12-24 ENCOUNTER — Encounter: Payer: Self-pay | Admitting: Family Medicine

## 2018-12-24 ENCOUNTER — Ambulatory Visit (INDEPENDENT_AMBULATORY_CARE_PROVIDER_SITE_OTHER): Payer: BC Managed Care – PPO | Admitting: Family Medicine

## 2018-12-24 ENCOUNTER — Other Ambulatory Visit: Payer: Self-pay

## 2018-12-24 VITALS — BP 112/74 | HR 62 | Temp 97.9°F | Ht 66.0 in | Wt 180.0 lb

## 2018-12-24 DIAGNOSIS — F5104 Psychophysiologic insomnia: Secondary | ICD-10-CM | POA: Diagnosis not present

## 2018-12-24 DIAGNOSIS — Z23 Encounter for immunization: Secondary | ICD-10-CM | POA: Diagnosis not present

## 2018-12-24 DIAGNOSIS — E663 Overweight: Secondary | ICD-10-CM

## 2018-12-24 DIAGNOSIS — I10 Essential (primary) hypertension: Secondary | ICD-10-CM

## 2018-12-24 DIAGNOSIS — E7849 Other hyperlipidemia: Secondary | ICD-10-CM

## 2018-12-24 MED ORDER — ZOLPIDEM TARTRATE 10 MG PO TABS: 10.0000 mg | ORAL_TABLET | Freq: Every day | ORAL | 5 refills | Status: DC

## 2018-12-24 MED ORDER — ATORVASTATIN CALCIUM 20 MG PO TABS: ORAL_TABLET | ORAL | 3 refills | Status: DC

## 2018-12-24 MED ORDER — LISINOPRIL 40 MG PO TABS: 40.0000 mg | ORAL_TABLET | Freq: Every day | ORAL | 3 refills | Status: DC

## 2018-12-26 ENCOUNTER — Encounter: Payer: Self-pay | Admitting: Family Medicine

## 2018-12-26 ENCOUNTER — Other Ambulatory Visit: Payer: Self-pay

## 2018-12-26 DIAGNOSIS — Z23 Encounter for immunization: Secondary | ICD-10-CM | POA: Insufficient documentation

## 2018-12-26 MED ORDER — IBUPROFEN 800 MG PO TABS: ORAL_TABLET | ORAL | 0 refills | Status: DC

## 2018-12-26 NOTE — Assessment & Plan Note (Signed)
Improved, she is congratulated on this  Patient re-educated about  the importance of commitment to a  minimum of 150 minutes of exercise per week as able.  The importance of healthy food choices with portion control discussed, as well as eating regularly and within a 12 hour window most days. The need to choose "clean , green" food 50 to 75% of the time is discussed, as well as to make water the primary drink and set a goal of 64 ounces water daily.    Weight /BMI 12/24/2018 07/04/2018 01/29/2018  WEIGHT 180 lb 179 lb 184 lb  HEIGHT 5\' 6"  5\' 6"  5\' 6"   BMI 29.05 kg/m2 28.89 kg/m2 29.7 kg/m2

## 2018-12-26 NOTE — Progress Notes (Signed)
Lori Sims     MRN: [id number]      DOB: 24-Feb-1961   HPI Lori Sims is here for follow up and re-evaluation of chronic medical conditions, medication management and review of any available recent lab and radiology data.  Preventive health is updated, specifically  Cancer screening and Immunization.   Questions or concerns regarding consultations or procedures which the PT has had in the interim are  addressed. The PT denies any adverse reactions to current medications since the last visit.  Fell in her tub approx 1 month ago, hurt hip and hit head, still has hip discomfort rated at 4 , able to weight bear with no significant pain  Stress of Covid 19 and working from home becoming a challenge and she trying to adjust, also now recently discovered that she has an adult nephew, and has sought out her Counsellor through her job to help her to best deal with this  ROS Denies recent fever or chills. Denies sinus pressure, nasal congestion, ear pain or sore throat. Denies chest congestion, productive cough or wheezing. Denies chest pains, palpitations and leg swelling Denies abdominal pain, nausea, vomiting,diarrhea or constipation.   Denies dysuria, frequency, hesitancy or incontinence. . Denies headaches, seizures, numbness, or tingling. . Denies skin break down or rash.   PE  BP 112/74    Pulse 62    Temp 97.9 F (36.6 C) (Temporal)    Ht 5\' 6"  (1.676 m)    Wt 180 lb (81.6 kg)    LMP 08/26/2014    SpO2 99%    BMI 29.05 kg/m   Patient alert and oriented and in no cardiopulmonary distress.  HEENT: No facial asymmetry, EOMI,   oropharynx pink and moist.  Neck supple no JVD, no mass.  Chest: Clear to auscultation bilaterally.  CVS: S1, S2 no murmurs, no S3.Regular rate.  ABD: Soft non tender.   Ext: No edema  MS: Adequate ROM spine, shoulders, hips and knees.  Skin: Intact, no ulcerations or rash noted.  Psych: Good eye contact, normal affect. Memory intact not anxious or  depressed appearing.  CNS: CN 2-12 intact, power,  normal throughout.no focal deficits noted.   Assessment & Plan  Essential hypertension Controlled, no change in medication DASH diet and commitment to daily physical activity for a minimum of 30 minutes discussed and encouraged, as a part of hypertension management. The importance of attaining a healthy weight is also discussed.  BP/Weight 12/24/2018 07/04/2018 01/29/2018 11/09/2017 05/11/2017 63/89/3734 07/22/7679  Systolic BP 157 262 035 597 416 384 536  Diastolic BP 74 72 72 80 82 57 70  Wt. (Lbs) 180 179 184 189 191 - 190  BMI 29.05 28.89 29.7 30.51 30.83 - 30.67       Overweight (BMI 25.0-29.9) Improved, she is congratulated on this  Patient re-educated about  the importance of commitment to a  minimum of 150 minutes of exercise per week as able.  The importance of healthy food choices with portion control discussed, as well as eating regularly and within a 12 hour window most days. The need to choose "clean , green" food 50 to 75% of the time is discussed, as well as to make water the primary drink and set a goal of 64 ounces water daily.    Weight /BMI 12/24/2018 07/04/2018 01/29/2018  WEIGHT 180 lb 179 lb 184 lb  HEIGHT 5\' 6"  5\' 6"  5\' 6"   BMI 29.05 kg/m2 28.89 kg/m2 29.7 kg/m2      Hyperlipidemia  Hyperlipidemia:Low fat diet discussed and encouraged.   Lipid Panel  Lab Results  Component Value Date   CHOL 202 (H) 12/10/2018   HDL 60 12/10/2018   LDLCALC 117 (H) 12/10/2018   TRIG 131 12/10/2018   CHOLHDL 3.4 12/10/2018     Improved , needs to lower fat intake and take medication consistently  Insomnia Sleep hygiene reviewed and written information offered also. Prescription sent for  medication needed.   Need for shingles vaccine After obtaining informed consent, the vaccine is  administered , with no adverse effect noted at the time of administration.

## 2018-12-26 NOTE — Assessment & Plan Note (Signed)
After obtaining informed consent, the vaccine is  administered , with no adverse effect noted at the time of administration.  

## 2018-12-26 NOTE — Assessment & Plan Note (Signed)
Controlled, no change in medication DASH diet and commitment to daily physical activity for a minimum of 30 minutes discussed and encouraged, as a part of hypertension management. The importance of attaining a healthy weight is also discussed.  BP/Weight 12/24/2018 07/04/2018 01/29/2018 11/09/2017 05/11/2017 01/65/5374 01/12/7077  Systolic BP 675 449 201 007 121 975 883  Diastolic BP 74 72 72 80 82 57 70  Wt. (Lbs) 180 179 184 189 191 - 190  BMI 29.05 28.89 29.7 30.51 30.83 - 30.67

## 2018-12-26 NOTE — Assessment & Plan Note (Signed)
Hyperlipidemia:Low fat diet discussed and encouraged.   Lipid Panel  Lab Results  Component Value Date   CHOL 202 (H) 12/10/2018   HDL 60 12/10/2018   LDLCALC 117 (H) 12/10/2018   TRIG 131 12/10/2018   CHOLHDL 3.4 12/10/2018     Improved , needs to lower fat intake and take medication consistently

## 2018-12-26 NOTE — Assessment & Plan Note (Signed)
Sleep hygiene reviewed and written information offered also. Prescription sent for  medication needed.  

## 2019-02-26 ENCOUNTER — Ambulatory Visit: Payer: BC Managed Care – PPO

## 2019-03-21 ENCOUNTER — Encounter: Payer: Self-pay | Admitting: Family Medicine

## 2019-03-21 ENCOUNTER — Ambulatory Visit (INDEPENDENT_AMBULATORY_CARE_PROVIDER_SITE_OTHER): Payer: BC Managed Care – PPO | Admitting: Family Medicine

## 2019-03-21 ENCOUNTER — Other Ambulatory Visit: Payer: Self-pay

## 2019-03-21 VITALS — BP 112/74 | Ht 66.0 in | Wt 170.0 lb

## 2019-03-21 DIAGNOSIS — F5104 Psychophysiologic insomnia: Secondary | ICD-10-CM | POA: Diagnosis not present

## 2019-03-21 DIAGNOSIS — F322 Major depressive disorder, single episode, severe without psychotic features: Secondary | ICD-10-CM

## 2019-03-21 DIAGNOSIS — I10 Essential (primary) hypertension: Secondary | ICD-10-CM | POA: Diagnosis not present

## 2019-03-21 MED ORDER — FLUOXETINE HCL 20 MG PO TABS: 20.0000 mg | ORAL_TABLET | Freq: Every day | ORAL | 3 refills | Status: DC

## 2019-03-21 NOTE — Patient Instructions (Addendum)
F/U in 1 week , telephone visit is fine  Work excuse from 10/08 to 10/ 19/2020 pt will collect today at 1:30  fluoxetine  20 mg is prescribed  Pls do counselling through your job

## 2019-03-24 ENCOUNTER — Encounter: Payer: Self-pay | Admitting: Family Medicine

## 2019-03-24 DIAGNOSIS — F322 Major depressive disorder, single episode, severe without psychotic features: Secondary | ICD-10-CM | POA: Insufficient documentation

## 2019-03-24 NOTE — Assessment & Plan Note (Signed)
Start fluoxetine, work excuse x 1 week and re eval, pt aware that for longer leave , she needs Psych management

## 2019-03-24 NOTE — Assessment & Plan Note (Signed)
Controlled, no change in medication DASH diet and commitment to daily physical activity for a minimum of 30 minutes discussed and encouraged, as a part of hypertension management. The importance of attaining a healthy weight is also discussed.  BP/Weight 03/21/2019 12/24/2018 07/04/2018 01/29/2018 11/09/2017 05/11/2017 0000000  Systolic BP XX123456 XX123456 A999333 0000000 123456 [id number]  Diastolic BP 74 74 72 72 80 82 57  Wt. (Lbs) 170 180 179 184 189 191 -  BMI 27.44 29.05 28.89 29.7 30.51 30.83 -

## 2019-03-24 NOTE — Assessment & Plan Note (Signed)
inadfequate control. Sleep hygiene reviewed and written information offered also. Prescription sent for  medication needed.

## 2019-03-24 NOTE — Progress Notes (Signed)
Virtual Visit via Telephone Note  I connected with Tennis Ship on 03/24/19 at 11:00 AM EDT by telephone and verified that I am speaking with the correct person using two identifiers.  Location: Patient: home Provider: office   I discussed the limitations, risks, security and privacy concerns of performing an evaluation and management service by telephone and the availability of in person appointments. I also discussed with the patient that there may be a patient responsible charge related to this service. The patient expressed understanding and agreed to proceed.   History of Present Illness: C/o increased stress, feeling overwhelmed, unable to focus, needs time off work to improve function Not suicidal or homicidal Requests time from work to improve hjer health and be able to function    Observations/Objective: BP 112/74   Ht 5\' 6"  (1.676 m)   Wt 170 lb (77.1 kg)   LMP 08/26/2014   BMI 27.44 kg/m  Good communication with no confusion and intact memory. Alert and oriented x 3 No signs of respiratory distress during speech    Assessment and Plan:  Depression, major, single episode, severe (HCC) Start fluoxetine, work excuse x 1 week and re eval, pt aware that for longer leave , she needs Psych management  Insomnia inadfequate control. Sleep hygiene reviewed and written information offered also. Prescription sent for  medication needed.   Essential hypertension Controlled, no change in medication DASH diet and commitment to daily physical activity for a minimum of 30 minutes discussed and encouraged, as a part of hypertension management. The importance of attaining a healthy weight is also discussed.  BP/Weight 03/21/2019 12/24/2018 07/04/2018 01/29/2018 11/09/2017 05/11/2017 0000000  Systolic BP XX123456 XX123456 A999333 0000000 123456 [id number]  Diastolic BP 74 74 72 72 80 82 57  Wt. (Lbs) 170 180 179 184 189 191 -  BMI 27.44 29.05 28.89 29.7 30.51 30.83 -        Follow Up  Instructions:    I discussed the assessment and treatment plan with the patient. The patient was provided an opportunity to ask questions and all were answered. The patient agreed with the plan and demonstrated an understanding of the instructions.   The patient was advised to call back or seek an in-person evaluation if the symptoms worsen or if the condition fails to improve as anticipated.  I provided 22 minutes of non-face-to-face time during this encounter.   Tula Nakayama, MD

## 2019-03-25 ENCOUNTER — Encounter (HOSPITAL_COMMUNITY): Payer: BC Managed Care – PPO

## 2019-03-26 ENCOUNTER — Other Ambulatory Visit (HOSPITAL_COMMUNITY): Payer: Self-pay | Admitting: Family Medicine

## 2019-03-26 DIAGNOSIS — Z1231 Encounter for screening mammogram for malignant neoplasm of breast: Secondary | ICD-10-CM

## 2019-03-28 ENCOUNTER — Encounter (HOSPITAL_COMMUNITY): Payer: BC Managed Care – PPO

## 2019-03-28 ENCOUNTER — Other Ambulatory Visit: Payer: Self-pay

## 2019-03-28 ENCOUNTER — Ambulatory Visit (INDEPENDENT_AMBULATORY_CARE_PROVIDER_SITE_OTHER): Payer: BC Managed Care – PPO | Admitting: Family Medicine

## 2019-03-28 VITALS — BP 112/74 | Ht 66.0 in | Wt 170.0 lb

## 2019-03-28 DIAGNOSIS — I10 Essential (primary) hypertension: Secondary | ICD-10-CM

## 2019-03-28 DIAGNOSIS — F5104 Psychophysiologic insomnia: Secondary | ICD-10-CM

## 2019-03-28 DIAGNOSIS — F322 Major depressive disorder, single episode, severe without psychotic features: Secondary | ICD-10-CM | POA: Diagnosis not present

## 2019-03-28 MED ORDER — FLUOXETINE HCL 20 MG PO CAPS: 20.0000 mg | ORAL_CAPSULE | Freq: Every day | ORAL | 1 refills | Status: DC

## 2019-03-28 NOTE — Patient Instructions (Addendum)
Keep follow up appointment in January as before, call if you need me sooner  Thankful that you are much better.  Work excuse from 03/21/2019 to return 04/01/2019. Note and appointment info will be available for you to collect in the office tomorrow , before 12 midday, as we discussed.

## 2019-03-28 NOTE — Progress Notes (Signed)
Virtual Visit via Telephone Note  I connected with Lori Sims on 03/28/19 at  1:20 PM EDT by telephone and verified that I am speaking with the correct person using two identifiers.  Location: Patient: home Provider: office   I discussed the limitations, risks, security and privacy concerns of performing an evaluation and management service by telephone and the availability of in person appointments. I also discussed with the patient that there may be a patient responsible charge related to this service. The patient expressed understanding and agreed to proceed.    History of Present Illness: Reports marked improvement in Lori Sims depression, feels as though she is able to focus and concentrate enough on Lori Sims job, so that she can return next week Repeat PHQ 9 shows marked improvement so return to work note for next week is being done    Observations/Objective: BP 112/74   Ht 5\' 6"  (1.676 m)   Wt 170 lb (77.1 kg)   LMP 08/26/2014   BMI 27.44 kg/m  Good communication with no confusion and intact memory. Alert and oriented x 3 No signs of respiratory distress during speech PHQ 9 score is 5, as compared with 17 1 week prior   Assessment and Plan:  Essential hypertension Controlled, no change in medication DASH diet and commitment to daily physical activity for a minimum of 30 minutes discussed and encouraged, as a part of hypertension management. The importance of attaining a healthy weight is also discussed.  BP/Weight 03/28/2019 03/21/2019 12/24/2018 07/04/2018 01/29/2018 11/09/2017 [id number]  Systolic BP XX123456 XX123456 XX123456 A999333 0000000 123456 Q000111Q  Diastolic BP 74 74 74 72 72 80 82  Wt. (Lbs) 170 170 180 179 184 189 191  BMI 27.44 27.44 29.05 28.89 29.7 30.51 30.83        Insomnia Sleep hygiene reviewed and written information offered also. Prescription sent for  medication needed.    Follow Up Instructions:    I discussed the assessment and treatment plan with the patient. The  patient was provided an opportunity to ask questions and all were answered. The patient agreed with the plan and demonstrated an understanding of the instructions.   The patient was advised to call back or seek an in-person evaluation if the symptoms worsen or if the condition fails to improve as anticipated.  I provided 22 minutes of non-face-to-face time during this encounter.   Tula Nakayama, MD

## 2019-03-29 ENCOUNTER — Ambulatory Visit (HOSPITAL_COMMUNITY)
Admission: RE | Admit: 2019-03-29 | Discharge: 2019-03-29 | Disposition: A | Discharge status: Home / Self Care | Payer: BC Managed Care – PPO | Source: Ambulatory Visit | Attending: Family Medicine | Admitting: Family Medicine

## 2019-03-29 ENCOUNTER — Other Ambulatory Visit: Payer: Self-pay

## 2019-03-29 DIAGNOSIS — Z1231 Encounter for screening mammogram for malignant neoplasm of breast: Secondary | ICD-10-CM

## 2019-03-30 ENCOUNTER — Encounter: Payer: Self-pay | Admitting: Family Medicine

## 2019-03-30 NOTE — Assessment & Plan Note (Signed)
Sleep hygiene reviewed and written information offered also. Prescription sent for  medication needed.  

## 2019-03-30 NOTE — Assessment & Plan Note (Signed)
Controlled, no change in medication DASH diet and commitment to daily physical activity for a minimum of 30 minutes discussed and encouraged, as a part of hypertension management. The importance of attaining a healthy weight is also discussed.  BP/Weight 03/28/2019 03/21/2019 12/24/2018 07/04/2018 01/29/2018 11/09/2017 [id number]  Systolic BP XX123456 XX123456 XX123456 A999333 0000000 123456 Q000111Q  Diastolic BP 74 74 74 72 72 80 82  Wt. (Lbs) 170 170 180 179 184 189 191  BMI 27.44 27.44 29.05 28.89 29.7 30.51 30.83

## 2019-04-18 ENCOUNTER — Other Ambulatory Visit: Payer: Self-pay | Admitting: Cardiology

## 2019-04-25 ENCOUNTER — Ambulatory Visit: Payer: BC Managed Care – PPO | Admitting: Family Medicine

## 2019-06-27 ENCOUNTER — Ambulatory Visit: Payer: BC Managed Care – PPO | Admitting: Family Medicine

## 2019-07-14 ENCOUNTER — Other Ambulatory Visit: Payer: Self-pay | Admitting: Family Medicine

## 2019-07-14 DIAGNOSIS — F5104 Psychophysiologic insomnia: Secondary | ICD-10-CM

## 2019-07-15 ENCOUNTER — Other Ambulatory Visit: Payer: Self-pay

## 2019-07-15 ENCOUNTER — Encounter: Payer: Self-pay | Admitting: Family Medicine

## 2019-07-15 ENCOUNTER — Ambulatory Visit (INDEPENDENT_AMBULATORY_CARE_PROVIDER_SITE_OTHER): Payer: BC Managed Care – PPO | Admitting: Family Medicine

## 2019-07-15 VITALS — BP 128/72 | Temp 97.5°F | Resp 15 | Ht 66.0 in | Wt 177.1 lb

## 2019-07-15 DIAGNOSIS — D126 Benign neoplasm of colon, unspecified: Secondary | ICD-10-CM

## 2019-07-15 DIAGNOSIS — E8881 Metabolic syndrome: Secondary | ICD-10-CM

## 2019-07-15 DIAGNOSIS — F322 Major depressive disorder, single episode, severe without psychotic features: Secondary | ICD-10-CM | POA: Diagnosis not present

## 2019-07-15 DIAGNOSIS — I10 Essential (primary) hypertension: Secondary | ICD-10-CM

## 2019-07-15 DIAGNOSIS — F5104 Psychophysiologic insomnia: Secondary | ICD-10-CM

## 2019-07-15 DIAGNOSIS — E559 Vitamin D deficiency, unspecified: Secondary | ICD-10-CM

## 2019-07-15 DIAGNOSIS — E7849 Other hyperlipidemia: Secondary | ICD-10-CM | POA: Diagnosis not present

## 2019-07-15 LAB — LIPID PANEL
Cholesterol: 179 mg/dL (ref <200)
HDL: 58 mg/dL (ref >=50)
LDL Cholesterol (Calc): 101 mg/dL (calc) — ABNORMAL HIGH
Non-HDL Cholesterol (Calc): 121 mg/dL (calc) (ref <130)
Total CHOL/HDL Ratio: 3.1 (calc) (ref <5.0)
Triglycerides: 102 mg/dL (ref <150)

## 2019-07-15 LAB — COMPLETE METABOLIC PANEL WITH GFR
AG Ratio: 1.5 (calc) (ref 1.0–2.5)
ALT: 12 U/L (ref 6–29)
AST: 17 U/L (ref 10–35)
Albumin: 4.6 g/dL (ref 3.6–5.1)
Alkaline phosphatase (APISO): 68 U/L (ref 37–153)
BUN/Creatinine Ratio: 16 (calc) (ref 6–22)
BUN: 18 mg/dL (ref 7–25)
CO2: 29 mmol/L (ref 20–32)
Calcium: 10.4 mg/dL (ref 8.6–10.4)
Chloride: 104 mmol/L (ref 98–110)
Creat: 1.1 mg/dL — ABNORMAL HIGH (ref 0.50–1.05)
GFR, Est African American: 64 mL/min/{1.73_m2} (ref >=60)
GFR, Est Non African American: 55 mL/min/{1.73_m2} — ABNORMAL LOW (ref >=60)
Globulin: 3 g/dL (calc) (ref 1.9–3.7)
Glucose, Bld: 84 mg/dL (ref 65–99)
Potassium: 4.2 mmol/L (ref 3.5–5.3)
Sodium: 143 mmol/L (ref 135–146)
Total Bilirubin: 0.4 mg/dL (ref 0.2–1.2)
Total Protein: 7.6 g/dL (ref 6.1–8.1)

## 2019-07-15 LAB — CBC
HCT: 35.1 % (ref 35.0–45.0)
Hemoglobin: 11.7 g/dL (ref 11.7–15.5)
MCH: 31 pg (ref 27.0–33.0)
MCHC: 33.3 g/dL (ref 32.0–36.0)
MCV: 92.9 fL (ref 80.0–100.0)
MPV: 10.4 fL (ref 7.5–12.5)
Platelets: 281 10*3/uL (ref 140–400)
RBC: 3.78 10*6/uL — ABNORMAL LOW (ref 3.80–5.10)
RDW: 12.1 % (ref 11.0–15.0)
WBC: 7 10*3/uL (ref 3.8–10.8)

## 2019-07-15 LAB — VITAMIN D 25 HYDROXY (VIT D DEFICIENCY, FRACTURES): Vit D, 25-Hydroxy: 27 ng/mL — ABNORMAL LOW (ref 30–100)

## 2019-07-15 LAB — TSH: TSH: 0.7 mIU/L (ref 0.40–4.50)

## 2019-07-15 MED ORDER — ALPRAZOLAM 0.25 MG PO TABS: 0.2500 mg | ORAL_TABLET | Freq: Every day | ORAL | 3 refills | Status: DC

## 2019-07-15 MED ORDER — ZOLPIDEM TARTRATE 10 MG PO TABS: 10.0000 mg | ORAL_TABLET | Freq: Every day | ORAL | 5 refills | Status: DC

## 2019-07-15 NOTE — Patient Instructions (Addendum)
F/U in office with MD in 3.5 months, call if you need me sooner  You are referred foor colonoscopy due in April  New additional medication for sleep, send message if ineffective please   Labs today CBC, lipid, cmp and EGFr, TSH, vit D today  It is important that you exercise regularly at least 30 minutes 5 times a week. If you develop chest pain, have severe difficulty breathing, or feel very tired, stop exercising immediately and seek medical attention   Think about what you will eat, plan ahead. Choose " clean, green, fresh or frozen" over canned, processed or packaged foods which are more sugary, salty and fatty. 70 to 75% of food eaten should be vegetables and fruit. Three meals at set times with snacks allowed between meals, but they must be fruit or vegetables. Aim to eat over a 12 hour period , example 7 am to 7 pm, and STOP after  your last meal of the day. Drink water,generally about 64 ounces per day, no other drink is as healthy. Fruit juice is best enjoyed in a healthy way, by EATING the fruit.

## 2019-07-16 ENCOUNTER — Encounter: Payer: Self-pay | Admitting: Gastroenterology

## 2019-07-17 ENCOUNTER — Encounter: Payer: Self-pay | Admitting: Family Medicine

## 2019-07-17 NOTE — Assessment & Plan Note (Signed)
Controlled, no change in medication DASH diet and commitment to daily physical activity for a minimum of 30 minutes discussed and encouraged, as a part of hypertension management. The importance of attaining a healthy weight is also discussed.  BP/Weight 07/15/2019 03/28/2019 03/21/2019 12/24/2018 07/04/2018 01/29/2018 [id number]  Systolic BP 0000000 XX123456 XX123456 XX123456 A999333 0000000 123456  Diastolic BP 72 74 74 74 72 72 80  Wt. (Lbs) 177.12 170 170 180 179 184 189  BMI 28.59 27.44 27.44 29.05 28.89 29.7 30.51

## 2019-07-17 NOTE — Assessment & Plan Note (Signed)
Controlled, no change in medication  

## 2019-07-17 NOTE — Assessment & Plan Note (Signed)
Referred for rept colonoscopy due 09/2019

## 2019-07-17 NOTE — Progress Notes (Signed)
   Lori Sims     MRN: [id number]      DOB: January 20, 1961   HPI Lori Sims is here for follow up and re-evaluation of chronic medical conditions, medication management and review of any available recent lab and radiology data.  Preventive health is updated, specifically  Cancer screening and Immunization.   Questions or concerns regarding consultations or procedures which the PT has had in the interim are  addressed. The PT denies any adverse reactions to current medications since the last visit.  C/o poor sleep and increased stress in recent times as her Mother has been ill and she has had to spend extra time and energy caring for her, also tired of working from home.    ROS Denies recent fever or chills. Denies sinus pressure, nasal congestion, ear pain or sore throat. Denies chest congestion, productive cough or wheezing. Denies chest pains, palpitations and leg swelling Denies abdominal pain, nausea, vomiting,diarrhea or constipation.   Denies dysuria, frequency, hesitancy or incontinence. Denies joint pain, swelling and limitation in mobility. Denies headaches, seizures, numbness, or tingling.  Denies skin break down or rash.   PE  BP 128/72   Temp (!) 97.5 F (36.4 C) (Temporal)   Resp 15   Ht 5\' 6"  (1.676 m)   Wt 177 lb 1.9 oz (80.3 kg)   LMP 08/26/2014   SpO2 99%   BMI 28.59 kg/m   Patient alert and oriented and in no cardiopulmonary distress.  HEENT: No facial asymmetry, EOMI,     Neck supple .  Chest: Clear to auscultation bilaterally.  CVS: S1, S2 no murmurs, no S3.Regular rate.  ABD: Soft non tender.   Ext: No edema  MS: Adequate ROM spine, shoulders, hips and knees.  Skin: Intact, no ulcerations or rash noted.  Psych: Good eye contact, normal affect. Memory intact not anxious or depressed appearing.  CNS: CN 2-12 intact, power,  normal throughout.no focal deficits noted.   Assessment & Plan  Essential hypertension Controlled, no change in  medication DASH diet and commitment to daily physical activity for a minimum of 30 minutes discussed and encouraged, as a part of hypertension management. The importance of attaining a healthy weight is also discussed.  BP/Weight 07/15/2019 03/28/2019 03/21/2019 12/24/2018 07/04/2018 01/29/2018 [id number]  Systolic BP 0000000 XX123456 XX123456 XX123456 A999333 0000000 123456  Diastolic BP 72 74 74 74 72 72 80  Wt. (Lbs) 177.12 170 170 180 179 184 189  BMI 28.59 27.44 27.44 29.05 28.89 29.7 30.51       Insomnia Sleep hygiene reviewed and written information offered also. Prescription sent for  medication needed. Xanax low dose added  Hyperlipidemia Hyperlipidemia:Low fat diet discussed and encouraged.   Lipid Panel  Lab Results  Component Value Date   CHOL 179 07/15/2019   HDL 58 07/15/2019   LDLCALC 101 (H) 07/15/2019   TRIG 102 07/15/2019   CHOLHDL 3.1 07/15/2019  need to reduce fat in diet     Depression, major, single episode, severe (HCC) Controlled, no change in medication   Tubular adenoma of colon Referred for rept colonoscopy due 09/2019

## 2019-07-17 NOTE — Assessment & Plan Note (Signed)
Sleep hygiene reviewed and written information offered also. Prescription sent for  medication needed. Xanax low dose added

## 2019-07-17 NOTE — Assessment & Plan Note (Signed)
Hyperlipidemia:Low fat diet discussed and encouraged.   Lipid Panel  Lab Results  Component Value Date   CHOL 179 07/15/2019   HDL 58 07/15/2019   LDLCALC 101 (H) 07/15/2019   TRIG 102 07/15/2019   CHOLHDL 3.1 07/15/2019  need to reduce fat in diet

## 2019-08-15 ENCOUNTER — Ambulatory Visit: Payer: BC Managed Care – PPO | Admitting: Gastroenterology

## 2019-08-17 ENCOUNTER — Encounter: Payer: Self-pay | Admitting: Gastroenterology

## 2019-08-17 NOTE — Progress Notes (Signed)
Referring Provider:  Fayrene Helper, MD Primary Care Physician:  Fayrene Helper, MD Primary Gastroenterologist:  Dr. Oneida Alar  Chief Complaint  Patient presents with  . Consult    TCS last done 5 years ago    HPI:   Lori Sims is a 59 y.o. female presenting today at the request of  Fayrene Helper, MD for repeat colonoscopy.  She was last seen in our office on 08/27/2014 for dysphagia and normocytic anemia.  4-25-month history of dysphagia and intermittent GERD with some improvement since starting Nexium.  Upon chart review, noted chronic, normocytic anemia.  Recent iron and ferritin had been added onto labs showing iron 84 but ferritin on the lower end of normal at 33.  Hemoglobin 10.7 and down 2 g from a year prior.  No overt GI bleeding.  Plans for EGD/ED + TCS.  Procedures completed on 09/12/2014 EGD: Normal-appearing esophagus s/p empiric dilation, nonerosive gastritis s/p multiple biopsies, few small polyps in the gastric body s/p polypectomy, normal-appearing duodenum.  Pathology benign.  Recommended considering BPE if dysphagia persist. TCS: 3 colon polyps, redundant left colon, moderate sized external hemorrhoids.  Recommend repeat colonoscopy in 5 years.  Pathology revealed 2 tubular adenomas and 1 hyperplastic polyp.  Most recent hemoglobin 11.7 on 07/15/2019.  This appears stable since December 2016.  Today: No concerns. No abdominal pain. Occasional constipation. Will take calcium and magnesium daily. Doesn't require anything for the constipation. Typically having BMs daily. No diarrhea. No blood in the stool or black stools. No nausea, vomiting, reflux symptoms, or dysphagia. Weight is up an down. States she has a lot of stress. Mom was recently in a car accident. She is ok. Having to help her mom a lot due to cognitive deficits.   No fever, chills, cold or flu like symptoms. No pre-syncope or syncope. No chest pain, heart palpitations, shortness of breath or cough.     No NSAIDs.    Past Medical History:  Diagnosis Date  . GERD (gastroesophageal reflux disease)   . Hypercholesterolemia   . Hypertension     Past Surgical History:  Procedure Laterality Date  . CESAREAN SECTION    . COLONOSCOPY  2010   Dr. Laural Golden: normal except external hemorrhoids  . COLONOSCOPY N/A 09/12/2014   Procedure: COLONOSCOPY;  Surgeon: Danie Binder, MD; 2 tubular adenomas and 1 hyperplastic polyp, redundant left colon, external hemorrhoids.  Repeat in 2021 with overtube.  . ESOPHAGEAL DILATION N/A 09/12/2014   Procedure: ESOPHAGEAL DILATION;  Surgeon: Danie Binder, MD;  Location: [facility];  Service: Endoscopy;  Laterality: N/A;  . ESOPHAGOGASTRODUODENOSCOPY N/A 09/12/2014   Procedure: ESOPHAGOGASTRODUODENOSCOPY (EGD);  Surgeon: Danie Binder, MD; normal-appearing esophagus s/p empiric dilation, nonerosive gastritis s/p multiple biopsies, few small polyps in the gastric body s/p polypectomy, normal-appearing duodenum.  Pathology benign.  . fibroid tumor right breast    . fibroid tumors left breast    . OVARIAN CYST DRAINAGE    . OVARIAN CYST REMOVAL      Current Outpatient Medications  Medication Sig Dispense Refill  . ALPRAZolam (XANAX) 0.25 MG tablet Take 1 tablet (0.25 mg total) by mouth at bedtime. 30 tablet 3  . atorvastatin (LIPITOR) 20 MG tablet Take one tablet by mouth three times weekly (Patient taking differently: Take 20 mg by mouth daily at 6 PM. ) 36 tablet 3  . FLUoxetine (PROZAC) 20 MG capsule Take 1 capsule (20 mg total) by mouth daily. 90 capsule 1  .  ibuprofen (ADVIL) 800 MG tablet One tablet once daily as needed. Max 3 tabs per week 30 tablet 0  . lisinopril (ZESTRIL) 40 MG tablet Take 1 tablet (40 mg total) by mouth daily. 90 tablet 3  . spironolactone (ALDACTONE) 25 MG tablet TAKE 1 TABLET (25 MG TOTAL) BY MOUTH DAILY. 90 tablet 3  . zolpidem (AMBIEN) 10 MG tablet Take 1 tablet (10 mg total) by mouth at bedtime. 30 tablet 5  . zolpidem  (AMBIEN) 10 MG tablet Take 1 tablet (10 mg total) by mouth at bedtime. 30 tablet 5   No current facility-administered medications for this visit.    Allergies as of 08/19/2019 - Review Complete 08/19/2019  Allergen Reaction Noted  . Penicillins  04/28/2008  . Tramadol Nausea And Vomiting 07/18/2014    Family History  Problem Relation Age of Onset  . Diabetes Mother   . Hypertension Father   . Colon cancer Neg Hx     Social History   Socioeconomic History  . Marital status: Divorced    Spouse name: Not on file  . Number of children: Not on file  . Years of education: Not on file  . Highest education level: Not on file  Occupational History  . Occupation: [employer]  Tobacco Use  . Smoking status: Never Smoker  . Smokeless tobacco: Never Used  Substance and Sexual Activity  . Alcohol use: Yes    Alcohol/week: 0.0 standard drinks    Comment: rare wine  . Drug use: No  . Sexual activity: Not on file  Other Topics Concern  . Not on file  Social History Narrative  . Not on file   Social Determinants of Health   Financial Resource Strain:   . Difficulty of Paying Living Expenses: Not on file  Food Insecurity:   . Worried About Charity fundraiser in the Last Year: Not on file  . Ran Out of Food in the Last Year: Not on file  Transportation Needs:   . Lack of Transportation (Medical): Not on file  . Lack of Transportation (Non-Medical): Not on file  Physical Activity:   . Days of Exercise per Week: Not on file  . Minutes of Exercise per Session: Not on file  Stress:   . Feeling of Stress : Not on file  Social Connections:   . Frequency of Communication with Friends and Family: Not on file  . Frequency of Social Gatherings with Friends and Family: Not on file  . Attends Religious Services: Not on file  . Active Member of Clubs or Organizations: Not on file  . Attends Archivist Meetings: Not on file  . Marital Status: Not on file  Intimate Partner Violence:    . Fear of Current or Ex-Partner: Not on file  . Emotionally Abused: Not on file  . Physically Abused: Not on file  . Sexually Abused: Not on file    Review of Systems: Gen: See HPI CV: See HPI Resp: See HPI GI: See HPI GU : Denies urinary burning, urinary frequency, urinary hesitancy MS: Admits to chronic knee and hip pain.  Derm: Denies rash Heme: Denies bruising or bleeding  Physical Exam: BP (!) 102/56   Pulse 66   Temp (!) 97 F (36.1 C) (Oral)   Ht 5\' 6"  (1.676 m)   Wt 176 lb 3.2 oz (79.9 kg)   LMP 08/26/2014   BMI 28.44 kg/m  General:   Alert and oriented. Pleasant and cooperative. Well-nourished and well-developed.  Head:  Normocephalic and atraumatic. Eyes:  Without icterus, sclera clear and conjunctiva pink.  Ears:  Normal auditory acuity. Lungs:  Clear to auscultation bilaterally. No wheezes, rales, or rhonchi. No distress.  Heart:  S1, S2 present without murmurs appreciated.  Abdomen:  +BS, soft, non-tender and non-distended. No HSM noted. No guarding or rebound. No masses appreciated.  Rectal:  Deferred  Msk:  Symmetrical without gross deformities. Normal posture. Extremities:  Without edema. Neurologic:  Alert and  oriented x4;  grossly normal neurologically. Skin:  Intact without significant lesions or rashes. Psych: Normal mood and affect.

## 2019-08-19 ENCOUNTER — Other Ambulatory Visit: Payer: Self-pay

## 2019-08-19 ENCOUNTER — Encounter: Payer: Self-pay | Admitting: Gastroenterology

## 2019-08-19 ENCOUNTER — Ambulatory Visit (INDEPENDENT_AMBULATORY_CARE_PROVIDER_SITE_OTHER): Payer: BC Managed Care – PPO | Admitting: Gastroenterology

## 2019-08-19 ENCOUNTER — Encounter: Payer: Self-pay | Admitting: *Deleted

## 2019-08-19 DIAGNOSIS — Z86010 Personal history of colonic polyps: Secondary | ICD-10-CM | POA: Insufficient documentation

## 2019-08-19 DIAGNOSIS — Z860101 Personal history of adenomatous and serrated colon polyps: Secondary | ICD-10-CM | POA: Insufficient documentation

## 2019-08-19 NOTE — Assessment & Plan Note (Addendum)
60 year old female with history of tubular adenomas presenting to schedule her surveillance colonoscopy.  Last colonoscopy on 09/12/2014 with 2 tubular adenomas, 1 hyperplastic polyp, redundant left colon, moderate size external hemorrhoids.  Recommendations to repeat colonoscopy in 5 years.  Lower GI symptoms at this time include occasional constipation.  No other significant upper or lower GI symptoms.  No alarm symptoms.  No family history of colon cancer.  Proceed with TCS with propofol with Dr. Oneida Alar (or Dr. Gala Romney if no availability with Dr. Oneida Alar) in the near future. The risks, benefits, and alternatives have been discussed in detail with patient. They have stated understanding and desire to proceed.  Propofol due to medications including Xanax and Ambien. Advised to add Benefiber or Metamucil daily for occasional constipation. May also use MiraLAX 1 capful (17 g) in 8 ounces of water daily as needed if occasional constipation continues on fiber supplement. Follow-up as recommended at the time of colonoscopy.  Advise she call if she had questions or concerns.

## 2019-08-19 NOTE — Patient Instructions (Addendum)
We will get you scheduled for your colonoscopy in the near future.  Your procedure may be scheduled with Dr. Gala Romney if Dr. Oneida Alar does not have any openings.  For occasional constipation, you can try adding Benefiber or Metamucil daily.  These are fiber supplements that should help with bowel regularity.  If needed, you can also use MiraLAX 1 capful (17 g) daily in 8 ounces of water as needed.  Continue to monitor your blood pressure closely.  As she has not taken lisinopril today, I would hold this medication today unless your blood pressure rises above 130-140 as the top number.  If you continue to have low blood pressure readings, you should follow-up with Dr. Moshe Cipro.  We will plan to see you back as recommended at the time of your colonoscopy.  Call if you have questions or concerns prior.  Lori Altes, PA-C [facility]

## 2019-08-21 ENCOUNTER — Telehealth: Payer: Self-pay

## 2019-08-21 ENCOUNTER — Encounter: Payer: Self-pay | Admitting: Family Medicine

## 2019-08-21 ENCOUNTER — Other Ambulatory Visit: Payer: Self-pay

## 2019-08-21 ENCOUNTER — Encounter (HOSPITAL_COMMUNITY): Payer: Self-pay

## 2019-08-21 ENCOUNTER — Emergency Department (HOSPITAL_COMMUNITY)
Admission: EM | Admit: 2019-08-21 | Discharge: 2019-08-21 | Disposition: A | Discharge status: Home / Self Care | Payer: BC Managed Care – PPO | Attending: Emergency Medicine | Admitting: Emergency Medicine

## 2019-08-21 DIAGNOSIS — I959 Hypotension, unspecified: Secondary | ICD-10-CM | POA: Diagnosis not present

## 2019-08-21 DIAGNOSIS — G4700 Insomnia, unspecified: Secondary | ICD-10-CM | POA: Insufficient documentation

## 2019-08-21 DIAGNOSIS — I10 Essential (primary) hypertension: Secondary | ICD-10-CM | POA: Insufficient documentation

## 2019-08-21 DIAGNOSIS — Z79899 Other long term (current) drug therapy: Secondary | ICD-10-CM | POA: Diagnosis not present

## 2019-08-21 DIAGNOSIS — R5383 Other fatigue: Secondary | ICD-10-CM | POA: Insufficient documentation

## 2019-08-21 DIAGNOSIS — R031 Nonspecific low blood-pressure reading: Secondary | ICD-10-CM | POA: Insufficient documentation

## 2019-08-21 LAB — CBC WITH DIFFERENTIAL/PLATELET
Abs Immature Granulocytes: 0.01 10*3/uL (ref 0.00–0.07)
Basophils Absolute: 0 10*3/uL (ref 0.0–0.1)
Basophils Relative: 0 %
Eosinophils Absolute: 0.1 10*3/uL (ref 0.0–0.5)
Eosinophils Relative: 2 %
HCT: 33.9 % — ABNORMAL LOW (ref 36.0–46.0)
Hemoglobin: 10.9 g/dL — ABNORMAL LOW (ref 12.0–15.0)
Immature Granulocytes: 0 %
Lymphocytes Relative: 37 %
Lymphs Abs: 2.6 10*3/uL (ref 0.7–4.0)
MCH: 31.1 pg (ref 26.0–34.0)
MCHC: 32.2 g/dL (ref 30.0–36.0)
MCV: 96.6 fL (ref 80.0–100.0)
Monocytes Absolute: 0.4 10*3/uL (ref 0.1–1.0)
Monocytes Relative: 5 %
Neutro Abs: 3.9 10*3/uL (ref 1.7–7.7)
Neutrophils Relative %: 56 %
Platelets: 275 10*3/uL (ref 150–400)
RBC: 3.51 MIL/uL — ABNORMAL LOW (ref 3.87–5.11)
RDW: 12.3 % (ref 11.5–15.5)
WBC: 7 10*3/uL (ref 4.0–10.5)
nRBC: 0 % (ref 0.0–0.2)

## 2019-08-21 LAB — BASIC METABOLIC PANEL
Anion gap: 9 (ref 5–15)
BUN: 21 mg/dL — ABNORMAL HIGH (ref 6–20)
CO2: 29 mmol/L (ref 22–32)
Calcium: 9.5 mg/dL (ref 8.9–10.3)
Chloride: 102 mmol/L (ref 98–111)
Creatinine, Ser: 1.14 mg/dL — ABNORMAL HIGH (ref 0.44–1.00)
GFR calc Af Amer: >60 mL/min (ref >60)
GFR calc non Af Amer: 53 mL/min — ABNORMAL LOW (ref >60)
Glucose, Bld: 82 mg/dL (ref 70–99)
Potassium: 4.4 mmol/L (ref 3.5–5.1)
Sodium: 140 mmol/L (ref 135–145)

## 2019-08-21 NOTE — ED Provider Notes (Signed)
[facility] Provider Note   CSN: [id number] Arrival date & time: 08/21/19  1318     History Chief Complaint  Patient presents with  . Hypotension    Lori Sims is a 59 y.o. female presenting for evaluation of hypotension.  She states was seen by her GI specialist to arrange screening colonoscopy at which time her bp was 102/56 two days ago.  She then checked her bp using her home device yesterday and her numbers were 90/55. She takes lisinopril 40 mg daily and has for some time, but stopped taking it on Monday at the suggestion of her GI specialist given these low numbers.  She denies dizziness, lightheadedness, chest pain, sob, also no n/v or reduced intake, ie no reason to suspect she is dehydrated.  She does endorse simple generalized fatigue since yesterday and has not been sleeping well, stating she has chronic insomnia.  She was started on xanax one month ago to help her with this, takes qhs with her Lorrin Mais and still does not get a full nights sleep.    HPI     Past Medical History:  Diagnosis Date  . GERD (gastroesophageal reflux disease)   . Hypercholesterolemia   . Hypertension     Patient Active Problem List   Diagnosis Date Noted  . History of adenomatous polyp of colon 08/19/2019  . Tubular adenoma of colon 07/15/2019  . Depression, major, single episode, severe ([place]) 03/24/2019  . Obstructive sleep apnea 11/05/2015  . Alopecia 06/10/2015  . Insomnia 06/10/2015  . Dysphagia, pharyngoesophageal phase 08/27/2014  . Anemia 08/27/2014  . Hot flashes, menopausal 07/17/2014  . Overweight (BMI 25.0-29.9) 07/01/2013  . Metabolic syndrome X [id number]  . Vitamin D deficiency 12/02/2010  . Hyperlipidemia 02/05/2010  . KNEE PAIN, RIGHT 01/27/2010  . B12 DEFICIENCY 07/10/2008  . Essential hypertension 07/06/2008  . GOITER, UNSPECIFIED 07/02/2008  . ENDOMETRIOSIS 07/02/2008  . IRON DEFIC ANEMIA [place] DIET IRON INTAKE 04/28/2008  . Allergic rhinitis  04/28/2008    Past Surgical History:  Procedure Laterality Date  . CESAREAN SECTION    . COLONOSCOPY  2010   Dr. Laural Golden: normal except external hemorrhoids  . COLONOSCOPY N/A 09/12/2014   Procedure: COLONOSCOPY;  Surgeon: Danie Binder, MD; 2 tubular adenomas and 1 hyperplastic polyp, redundant left colon, external hemorrhoids.  Repeat in 2021 with overtube.  . ESOPHAGEAL DILATION N/A 09/12/2014   Procedure: ESOPHAGEAL DILATION;  Surgeon: Danie Binder, MD;  Location[facility];  Service: Endoscopy;  Laterality: N/A;  . ESOPHAGOGASTRODUODENOSCOPY N/A 09/12/2014   Procedure: ESOPHAGOGASTRODUODENOSCOPY (EGD);  Surgeon: Danie Binder, MD; normal-appearing esophagus s/p empiric dilation, nonerosive gastritis s/p multiple biopsies, few small polyps in the gastric body s/p polypectomy, normal-appearing duodenum.  Pathology benign.  . fibroid tumor right breast    . fibroid tumors left breast    . OVARIAN CYST DRAINAGE    . OVARIAN CYST REMOVAL       OB History   No obstetric history on file.     Family History  Problem Relation Age of Onset  . Diabetes Mother   . Hypertension Father   . Colon cancer Neg Hx     Social History   Tobacco Use  . Smoking status: Never Smoker  . Smokeless tobacco: Never Used  Substance Use Topics  . Alcohol use: Yes    Alcohol/week: 0.0 standard drinks    Comment: rare wine  . Drug use: No    Home Medications Prior to Admission  medications   Medication Sig Start Date End Date Taking? Authorizing Provider  ALPRAZolam (XANAX) 0.25 MG tablet Take 1 tablet (0.25 mg total) by mouth at bedtime. 07/15/19  Yes Fayrene Helper, MD  atorvastatin (LIPITOR) 20 MG tablet Take one tablet by mouth three times weekly Patient taking differently: Take 20 mg by mouth daily at 6 PM.  12/24/18  Yes Fayrene Helper, MD  lisinopril (ZESTRIL) 40 MG tablet Take 1 tablet (40 mg total) by mouth daily. 12/24/18 08/21/19 Yes Fayrene Helper, MD  spironolactone  (ALDACTONE) 25 MG tablet TAKE 1 TABLET (25 MG TOTAL) BY MOUTH DAILY. 04/18/19 08/21/19 Yes Branch, Alphonse Guild, MD  zolpidem (AMBIEN) 10 MG tablet Take 1 tablet (10 mg total) by mouth at bedtime. 07/15/19 08/21/19 Yes Fayrene Helper, MD    Allergies    Penicillins and Tramadol  Review of Systems   Review of Systems  Constitutional: Positive for fatigue. Negative for appetite change and fever.  HENT: Negative for congestion.   Eyes: Negative.   Respiratory: Negative for chest tightness and shortness of breath.   Cardiovascular: Negative for chest pain and palpitations.  Gastrointestinal: Negative for abdominal pain, nausea and vomiting.  Genitourinary: Negative.   Musculoskeletal: Negative.   Skin: Negative.   Neurological: Negative for dizziness, weakness, light-headedness, numbness and headaches.  Psychiatric/Behavioral: Positive for sleep disturbance.    Physical Exam Updated Vital Signs BP 131/64 (BP Location: Right Arm)   Pulse 74   Temp 98.4 F (36.9 C) (Oral)   Resp 18   Ht 5\' 6"  (1.676 m)   Wt 79.8 kg   LMP 08/26/2014   SpO2 100%   BMI 28.41 kg/m   Physical Exam Vitals and nursing note reviewed.  Constitutional:      Appearance: She is well-developed.  HENT:     Head: Normocephalic and atraumatic.     Mouth/Throat:     Mouth: Mucous membranes are moist.     Pharynx: Oropharynx is clear.  Eyes:     Conjunctiva/sclera: Conjunctivae normal.     Comments: No conjunctival pallor  Cardiovascular:     Rate and Rhythm: Normal rate and regular rhythm.     Heart sounds: Normal heart sounds.  Pulmonary:     Effort: Pulmonary effort is normal.     Breath sounds: Normal breath sounds. No wheezing.  Musculoskeletal:        General: Normal range of motion.     Cervical back: Normal range of motion.  Skin:    General: Skin is warm and dry.  Neurological:     General: No focal deficit present.     Mental Status: She is alert and oriented to person, place, and time.       ED Results / Procedures / Treatments   Labs (all labs ordered are listed, but only abnormal results are displayed) Labs Reviewed  CBC WITH DIFFERENTIAL/PLATELET - Abnormal; Notable for the following components:      Result Value   RBC 3.51 (*)    Hemoglobin 10.9 (*)    HCT 33.9 (*)    All other components within normal limits  BASIC METABOLIC PANEL - Abnormal; Notable for the following components:   BUN 21 (*)    Creatinine, Ser 1.14 (*)    GFR calc non Af Amer 53 (*)    All other components within normal limits    EKG EKG Interpretation  Date/Time:  Wednesday August 21 2019 15:22:55 EST Ventricular Rate:  60 PR Interval:  QRS Duration: 74 QT Interval:  415 QTC Calculation: 415 R Axis:   79 Text Interpretation: Sinus rhythm no acute STEMI no prior for comparison Confirmed by Madalyn Rob [phone]) on 08/21/2019 4:25:23 PM   Radiology No results found.  Procedures Procedures (including critical care time)  Medications Ordered in ED Medications - No data to display  ED Course  I have reviewed the triage vital signs and the nursing notes.  Pertinent labs & imaging results that were available during my care of the patient were reviewed by me and considered in my medical decision making (see chart for details).    MDM Rules/Calculators/A&P                      Labs reviewed and stable, mild normocytic anemia c/w prior lab tests.  Orthostatic VS completed and these numbers are normal and do not provoke any sx of lightheadedness.  She has not taken her lisinopril in 2 days secondary to low bp's.  She was advised to continue tracking her bps at home and to take her lisinopril if her systolic reading is over [id number].  Plan f/u with her pcp as she has scheduled.   Final Clinical Impression(s) / ED Diagnoses Final diagnoses:  Low blood pressure reading    Rx / DC Orders ED Discharge Orders    None       Landis Martins 08/21/19 1627    Hayden Rasmussen,  MD 08/21/19 [phone]

## 2019-08-21 NOTE — Discharge Instructions (Addendum)
Your labs and exam today are reassuring.  As long as your blood pressure (top number) is over 130, I would continue taking your lisinopril.Keep your appointment with Dr. Moshe Cipro as planned.

## 2019-08-21 NOTE — Telephone Encounter (Signed)
Pt came in the office for a blood pressure check per Hannahs notes, I advised it would need to be an office appointment, as we Alin Hutchins need to adjust medication. I asked if she could come back at 4. She said NO, I have to work. I then asked what time would be good for you. She said her lunch time, and that is 12:30 to 1:30. So I advised we could see her at 1pm, give me a min and let me look to see what I have. The first avail 1pm appt was 3-21. She said that was not good enough, as today is the 10th. I asked, if there was another time and day that was good, she said NO, I will go to the emergency room and turned and walked out

## 2019-08-21 NOTE — ED Notes (Signed)
bp reading from home bp machine was:  147 83 sitting  134 81 standing

## 2019-08-21 NOTE — ED Triage Notes (Signed)
Pt states she has felt fatigue since yesterday and her BP has been running low 90's/50's

## 2019-08-24 ENCOUNTER — Ambulatory Visit: Payer: BC Managed Care – PPO | Attending: Internal Medicine

## 2019-08-24 DIAGNOSIS — Z23 Encounter for immunization: Secondary | ICD-10-CM

## 2019-08-24 NOTE — Progress Notes (Signed)
   Covid-19 Vaccination Clinic  Name:  Lori Sims    MRN: [id number] DOB: [birth year]  08/24/2019  [name] was observed post Covid-19 immunization for 15 minutes without incident. She was provided with Vaccine Information Sheet and instruction to access the V-Safe system.   [name] was instructed to call [phone] with any severe reactions post vaccine: Marland Kitchen Difficulty breathing  . Swelling of face and throat  . A fast heartbeat  . A bad rash all over body  . Dizziness and weakness   Immunizations Administered    Name Date Dose VIS Date Route   Moderna COVID-19 Vaccine 08/24/2019 12:18 PM 0.5 mL 05/14/2019 Intramuscular   Manufacturer: Moderna   Lot: [id number]   [id number]

## 2019-08-29 ENCOUNTER — Other Ambulatory Visit: Payer: Self-pay

## 2019-08-29 ENCOUNTER — Ambulatory Visit (INDEPENDENT_AMBULATORY_CARE_PROVIDER_SITE_OTHER): Payer: BC Managed Care – PPO | Admitting: Family Medicine

## 2019-08-29 ENCOUNTER — Encounter: Payer: Self-pay | Admitting: Family Medicine

## 2019-08-29 VITALS — BP 123/71 | HR 62 | Temp 98.1°F | Ht 66.0 in | Wt 177.0 lb

## 2019-08-29 DIAGNOSIS — R011 Cardiac murmur, unspecified: Secondary | ICD-10-CM | POA: Diagnosis not present

## 2019-08-29 DIAGNOSIS — E782 Mixed hyperlipidemia: Secondary | ICD-10-CM | POA: Diagnosis not present

## 2019-08-29 DIAGNOSIS — I10 Essential (primary) hypertension: Secondary | ICD-10-CM

## 2019-08-29 DIAGNOSIS — R0989 Other specified symptoms and signs involving the circulatory and respiratory systems: Secondary | ICD-10-CM

## 2019-08-29 MED ORDER — LISINOPRIL 20 MG PO TABS: 20.0000 mg | ORAL_TABLET | Freq: Every day | ORAL | 3 refills | Status: DC

## 2019-08-29 NOTE — Progress Notes (Addendum)
Cardiology Office Note  Date: 08/29/2019   ID: Lori Sims, DOB Jun 28, 1960, MRN [id number]  PCP:  Lori Helper, MD  Cardiologist:  Lori Dolly, MD Electrophysiologist:  None   Chief Complaint: HTN, HLD  History of Present Illness: Lori Sims is a 59 y.o. female with a history of HTN.  Last seen by Dr. Harl Bowie in January 29, 2018.  She has been unable to tolerate clonidine or amlodipine in the past due to leg swelling and chest pain.  She also stated she believed her blood pressure medication had contributed to insomnia and hair loss.  At that visit she was only on Maxide.  Current antihypertensives listed include lisinopril 40 mg daily and spironolactone 25 mg daily.  Both prescriptions expired on 08/21/2019.  Also taking atorvastatin 20 mg daily at 6 PM 3 times weekly.  Recently saw gastroenterologist to arrange a screening colonoscopy at which time her blood pressure was 102/56.  She had checked her blood pressure the previous day and blood pressure was 90/55.  Her GI specialist suggested she stop the lisinopril given the low blood pressures.  Presented to the emergency room on 08/21/2019 for hypotension evaluation.  She had not taken her lisinopril for the previous 2 days prior to presentation.  Blood pressure was 131/64.  She was advised at discharge from ED to only take her lisinopril if systolic BP greater than [id number].  She was mildly anemic in the emergency room with hemoglobin of 10.9 and hematocrit of 33.9.  Creatinine 1.14, GFR greater than 60 on 08/21/2019.  She states she has some stress in her life taking care of her mother who has dementia.  She mentions that she knows anxiety and stress can cause increased blood pressure.  She states she stopped her spironolactone 10 days ago.  She has only taken her lisinopril on 3 separate occasions since recent ED visit.  She has a log of blood pressures with her today ranging in the low 130s / 70s to 80s.  Past Medical History:    Diagnosis Date  . GERD (gastroesophageal reflux disease)   . Hypercholesterolemia   . Hypertension     Past Surgical History:  Procedure Laterality Date  . CESAREAN SECTION    . COLONOSCOPY  2010   Dr. Laural Golden: normal except external hemorrhoids  . COLONOSCOPY N/A 09/12/2014   Procedure: COLONOSCOPY;  Surgeon: Danie Binder, MD; 2 tubular adenomas and 1 hyperplastic polyp, redundant left colon, external hemorrhoids.  Repeat in 2021 with overtube.  . ESOPHAGEAL DILATION N/A 09/12/2014   Procedure: ESOPHAGEAL DILATION;  Surgeon: Danie Binder, MD;  [facility];  Service: Endoscopy;  Laterality: N/A;  . ESOPHAGOGASTRODUODENOSCOPY N/A 09/12/2014   Procedure: ESOPHAGOGASTRODUODENOSCOPY (EGD);  Surgeon: Danie Binder, MD; normal-appearing esophagus s/p empiric dilation, nonerosive gastritis s/p multiple biopsies, few small polyps in the gastric body s/p polypectomy, normal-appearing duodenum.  Pathology benign.  . fibroid tumor right breast    . fibroid tumors left breast    . OVARIAN CYST DRAINAGE    . OVARIAN CYST REMOVAL      Current Outpatient Medications  Medication Sig Dispense Refill  . ALPRAZolam (XANAX) 0.25 MG tablet Take 1 tablet (0.25 mg total) by mouth at bedtime. 30 tablet 3  . atorvastatin (LIPITOR) 20 MG tablet Take one tablet by mouth three times weekly (Patient taking differently: Take 20 mg by mouth daily at 6 PM. ) 36 tablet 3  . zolpidem (AMBIEN) 10 MG tablet Take 1 tablet (  10 mg total) by mouth at bedtime. 30 tablet 5  . lisinopril (ZESTRIL) 40 MG tablet Take 1 tablet (40 mg total) by mouth daily. (Patient not taking: Reported on 08/29/2019) 90 tablet 3  . spironolactone (ALDACTONE) 25 MG tablet TAKE 1 TABLET (25 MG TOTAL) BY MOUTH DAILY. (Patient not taking: Reported on 08/29/2019) 90 tablet 3   No current facility-administered medications for this visit.   Allergies:  Penicillins and Tramadol   Social History: The patient  reports that she has never  smoked. She has never used smokeless tobacco. She reports current alcohol use. She reports that she does not use drugs.   Family History: The patient's family history includes Diabetes in her mother; Hypertension in her father.   ROS:  Please see the history of present illness. Otherwise, complete review of systems is positive for none.  All other systems are reviewed and negative.    Physical Exam: VS:  BP 123/71   Pulse 62   Temp 98.1 F (36.7 C)   Ht 5\' 6"  (1.676 m)   Wt 177 lb (80.3 kg)   LMP 08/26/2014   SpO2 99%   BMI 28.57 kg/m , BMI Body mass index is 28.57 kg/m.  Wt Readings from Last 3 Encounters:  08/29/19 177 lb (80.3 kg)  08/21/19 176 lb (79.8 kg)  08/19/19 176 lb 3.2 oz (79.9 kg)    General: Patient appears comfortable at rest. Neck: Supple, no elevated JVP, has left carotid bruit and abnormal bounding pulsation in Right Carotid, no thyromegaly. Lungs: Clear to auscultation, nonlabored breathing at rest. Cardiac: Regular rate and rhythm, no S3, 2-3 / 6 systolic murmur best heard at LLSB, no pericardial rub. Extremities: No pitting edema, distal pulses 2+. Skin: Warm and dry. Neuropsychiatric: Alert and oriented x3, affect grossly appropriate.  ECG:  None today  Recent Labwork: 07/15/2019: ALT 12; AST 17; TSH 0.70 08/21/2019: BUN 21; Creatinine, Ser 1.14; Hemoglobin 10.9; Platelets 275; Potassium 4.4; Sodium 140     Component Value Date/Time   CHOL 179 07/15/2019 1024   TRIG 102 07/15/2019 1024   HDL 58 07/15/2019 1024   CHOLHDL 3.1 07/15/2019 1024   VLDL 19 10/08/2016 0915   LDLCALC 101 (H) 07/15/2019 1024    Other Studies Reviewed Today:   Assessment and Plan:  1. Essential hypertension   2. Mixed hyperlipidemia   3. Heart murmur   4. Left carotid bruit    1. Essential hypertension Patient has stopped her spironolactone and is only taking her lisinopril on 3 separate occasions recently, not on a daily basis.  Blood pressure is 123/71 today.   Advised patient to stop spironolactone and decrease lisinopril to 20 mg daily.  Get a basic metabolic panel in 1 week after medication adjustment. Advised patient to start checking her blood pressure every day and keep a log as she has recently been doing and call with the report in 1 to 2 weeks with blood pressure results.  Advised we may need to adjust based on blood pressures.  2. Mixed hyperlipidemia Last LDL 101 on 07/24/2019 Continue Atorvastatin 20 mg daily  3. Heart Murmur Patient has an audible systolic murmur best heard at left lower sternal border.  Patient states she has never been told she has a murmur.  Advised patient we probably need to schedule an echocardiogram to check for origin of the murmur and other possible structural abnormalities. She denies any symptoms of dyspnea, LE edema, fatigue.  4. Left carotid bruit Patient has a  left carotid bruit and increased bounding pulsation in right carotid artery without a bruit.  Get carotid artery duplex study.  Medication Adjustments/Labs and Tests Ordered: Current medicines are reviewed at length with the patient today.  Concerns regarding medicines are outlined above.   Disposition: Follow-up with Dr. Harl Bowie or APP 4 - 6 weeks  Signed, Levell July, NP 08/29/2019 2:35 PM    [facility]

## 2019-08-29 NOTE — Patient Instructions (Signed)
Medication Instructions:  Your physician has recommended you make the following change in your medication:  Stop Taking Spironolactone Decrease Lisinopril to 20 mg Daily   *If you need a refill on your cardiac medications before your next appointment, please call your pharmacy*   Lab Work: Your physician recommends that you return for lab work in: 2 weeks (09-12-19)  If you have labs (blood work) drawn today and your tests are completely normal, you will receive your results only by: Marland Kitchen MyChart Message (if you have MyChart) OR . A paper copy in the mail If you have any lab test that is abnormal or we need to change your treatment, we will call you to review the results.   Testing/Procedures: Your physician has requested that you have an echocardiogram. Echocardiography is a painless test that uses sound waves to create images of your heart. It provides your doctor with information about the size and shape of your heart and how well your heart's chambers and valves are working. This procedure takes approximately one hour. There are no restrictions for this procedure.  Your physician has requested that you have a carotid duplex. This test is an ultrasound of the carotid arteries in your neck. It looks at blood flow through these arteries that supply the brain with blood. Allow one hour for this exam. There are no restrictions or special instructions.   Follow-Up: At [facility], you and your health needs are our priority.  As part of our continuing mission to provide you with exceptional heart care, we have created designated Provider Care Teams.  These Care Teams include your primary Cardiologist (physician) and Advanced Practice Providers (APPs -  Physician Assistants and Nurse Practitioners) who all work together to provide you with the care you need, when you need it.  We recommend signing up for the patient portal called "MyChart".  Sign up information is provided on this After Visit  Summary.  MyChart is used to connect with patients for Virtual Visits (Telemedicine).  Patients are able to view lab/test results, encounter notes, upcoming appointments, etc.  Non-urgent messages can be sent to your provider as well.   To learn more about what you can do with MyChart, go to [email or web address].    Your next appointment:   4 week(s)  The format for your next appointment:   In Person  Provider:   You may see Carlyle Dolly, MD or one of the following Advanced Practice Providers on your designated Care Team:    Bernerd Pho, PA-C   Ermalinda Barrios, [place]     Other Instructions Thank you for choosing [facility]!

## 2019-09-03 ENCOUNTER — Other Ambulatory Visit: Payer: Self-pay

## 2019-09-03 ENCOUNTER — Ambulatory Visit (HOSPITAL_COMMUNITY)
Admission: RE | Admit: 2019-09-03 | Discharge: 2019-09-03 | Disposition: A | Discharge status: Home / Self Care | Payer: BC Managed Care – PPO | Source: Ambulatory Visit | Attending: Family Medicine | Admitting: Family Medicine

## 2019-09-03 DIAGNOSIS — R011 Cardiac murmur, unspecified: Secondary | ICD-10-CM | POA: Diagnosis not present

## 2019-09-03 NOTE — Progress Notes (Signed)
*  PRELIMINARY RESULTS* Echocardiogram 2D Echocardiogram has been performed.  Leavy Cella 09/03/2019, 10:19 AM

## 2019-09-05 ENCOUNTER — Ambulatory Visit: Payer: BC Managed Care – PPO | Attending: Internal Medicine

## 2019-09-05 ENCOUNTER — Other Ambulatory Visit: Payer: BC Managed Care – PPO

## 2019-09-05 ENCOUNTER — Other Ambulatory Visit: Payer: Self-pay

## 2019-09-05 DIAGNOSIS — Z20822 Contact with and (suspected) exposure to covid-19: Secondary | ICD-10-CM | POA: Diagnosis not present

## 2019-09-06 ENCOUNTER — Telehealth: Payer: Self-pay

## 2019-09-06 LAB — NOVEL CORONAVIRUS, NAA: SARS-CoV-2, NAA: NOT DETECTED

## 2019-09-06 LAB — SARS-COV-2, NAA 2 DAY TAT

## 2019-09-06 NOTE — Telephone Encounter (Signed)
-----   Message from Massie Maroon, [place] sent at 09/04/2019  4:26 PM EDT -----  ----- Message ----- From: Verta Ellen., NP Sent: 09/03/2019   9:49 PM EDT To: Merlene Laughter, RN  Please call the patient and tell her the echocardiogram results looked good. She has normal pumping function and no leaky valves. She had a murmur. Tell her this is likely an "innocent murmur", which means that this is a normal finding. Sometimes this can be heard even if there are no leaky or narrowed valves. Tell her this is good and we just needed to make sure this was not a pathologic murmur that we needed to keep an eye on. Thank You

## 2019-09-06 NOTE — Telephone Encounter (Signed)
Pt saw results on MyChart. She thanked me for call.

## 2019-09-12 LAB — BASIC METABOLIC PANEL
BUN/Creatinine Ratio: 15 (calc) (ref 6–22)
BUN: 17 mg/dL (ref 7–25)
CO2: 30 mmol/L (ref 20–32)
Calcium: 9.8 mg/dL (ref 8.6–10.4)
Chloride: 106 mmol/L (ref 98–110)
Creat: 1.17 mg/dL — ABNORMAL HIGH (ref 0.50–1.05)
Glucose, Bld: 88 mg/dL (ref 65–99)
Potassium: 3.9 mmol/L (ref 3.5–5.3)
Sodium: 144 mmol/L (ref 135–146)

## 2019-09-17 ENCOUNTER — Telehealth: Payer: Self-pay | Admitting: *Deleted

## 2019-09-17 NOTE — Telephone Encounter (Signed)
-----   Message from Verta Ellen., NP sent at 09/13/2019  9:41 AM EDT ----- Please call the patient and tell her labs looked good. Tell her to keep an eye on her blood pressure after we stopped the Spironolactone and decreased the Lisinopril. (These changes were made because her BP was low) in a recent ER visit. Tell her that if her BP starts creeping up above the 130/80 range (sustained over time) we may need to address it in the future.

## 2019-09-17 NOTE — Telephone Encounter (Signed)
Patient informed. Copy sent to PCP °

## 2019-09-21 ENCOUNTER — Ambulatory Visit: Payer: BC Managed Care – PPO | Attending: Internal Medicine

## 2019-09-21 DIAGNOSIS — Z23 Encounter for immunization: Secondary | ICD-10-CM

## 2019-09-21 NOTE — Progress Notes (Signed)
   Covid-19 Vaccination Clinic  Name:  Lori Sims    MRN: [id number] DOB: April 01, 1961  09/21/2019  [name] was observed post Covid-19 immunization for 15 minutes without incident. She was provided with Vaccine Information Sheet and instruction to access the V-Safe system.   [name] was instructed to call 911 with any severe reactions post vaccine: Marland Kitchen Difficulty breathing  . Swelling of face and throat  . A fast heartbeat  . A bad rash all over body  . Dizziness and weakness   Immunizations Administered    Name Date Dose VIS Date Route   Moderna COVID-19 Vaccine 09/21/2019 11:50 AM 0.5 mL 05/14/2019 Intramuscular   Manufacturer: Moderna   Lot: [id number]   [id number]

## 2019-09-23 ENCOUNTER — Other Ambulatory Visit: Payer: Self-pay | Admitting: *Deleted

## 2019-09-23 MED ORDER — ATORVASTATIN CALCIUM 20 MG PO TABS: ORAL_TABLET | ORAL | 3 refills | Status: DC

## 2019-09-24 ENCOUNTER — Ambulatory Visit (INDEPENDENT_AMBULATORY_CARE_PROVIDER_SITE_OTHER): Payer: BC Managed Care – PPO

## 2019-09-24 ENCOUNTER — Other Ambulatory Visit: Payer: Self-pay | Admitting: Family Medicine

## 2019-09-24 ENCOUNTER — Other Ambulatory Visit: Payer: Self-pay

## 2019-09-24 DIAGNOSIS — R0989 Other specified symptoms and signs involving the circulatory and respiratory systems: Secondary | ICD-10-CM

## 2019-09-24 DIAGNOSIS — I6523 Occlusion and stenosis of bilateral carotid arteries: Secondary | ICD-10-CM

## 2019-09-25 ENCOUNTER — Ambulatory Visit: Payer: BC Managed Care – PPO

## 2019-09-27 ENCOUNTER — Telehealth: Payer: Self-pay | Admitting: *Deleted

## 2019-09-27 DIAGNOSIS — R0989 Other specified symptoms and signs involving the circulatory and respiratory systems: Secondary | ICD-10-CM

## 2019-09-27 NOTE — Telephone Encounter (Signed)
Patient informed. Copy sent to PCP °

## 2019-09-27 NOTE — Telephone Encounter (Signed)
-----   Message from Verta Ellen., NP sent at 09/25/2019  9:26 AM EDT ----- Please call the patient and tell her the carotid study did show she has some medium narrowing in her left carotid artery and mild narrowing in her right carotid.  Tell her the best way to keep this from getting worse is to take her  cholesterol medication as directed and watch the intake of saturated fats in her diet. Tell her we need to keep an eye on this by doing this study yearly. Please schedule a repeat study around this time next year.

## 2019-09-27 NOTE — Addendum Note (Signed)
Addended by: Merlene Laughter on: 09/27/2019 04:23 PM   Modules accepted: Orders

## 2019-10-07 ENCOUNTER — Other Ambulatory Visit: Payer: Self-pay | Admitting: Family Medicine

## 2019-10-08 ENCOUNTER — Other Ambulatory Visit: Payer: Self-pay | Admitting: Family Medicine

## 2019-10-08 MED ORDER — FLUOXETINE HCL 20 MG PO TABS: 20.0000 mg | ORAL_TABLET | Freq: Every day | ORAL | 1 refills | Status: DC

## 2019-10-08 NOTE — Telephone Encounter (Signed)
Pt is requesting refill however this medication is not on her med list can we refill?

## 2019-10-09 ENCOUNTER — Other Ambulatory Visit: Payer: Self-pay | Admitting: *Deleted

## 2019-10-21 ENCOUNTER — Ambulatory Visit: Payer: BC Managed Care – PPO | Admitting: Family Medicine

## 2019-10-22 ENCOUNTER — Other Ambulatory Visit: Payer: Self-pay

## 2019-10-22 ENCOUNTER — Ambulatory Visit (INDEPENDENT_AMBULATORY_CARE_PROVIDER_SITE_OTHER): Payer: BC Managed Care – PPO | Admitting: Cardiology

## 2019-10-22 VITALS — BP 126/72 | HR 78 | Temp 96.5°F | Ht 66.0 in | Wt 176.0 lb

## 2019-10-22 DIAGNOSIS — I10 Essential (primary) hypertension: Secondary | ICD-10-CM | POA: Diagnosis not present

## 2019-10-22 DIAGNOSIS — I6523 Occlusion and stenosis of bilateral carotid arteries: Secondary | ICD-10-CM | POA: Diagnosis not present

## 2019-10-22 NOTE — Patient Instructions (Signed)
Medication Instructions:  Your physician recommends that you continue on your current medications as directed. Please refer to the Current Medication list given to you today.  *If you need a refill on your cardiac medications before your next appointment, please call your pharmacy*   Lab Work: NONE   If you have labs (blood work) drawn today and your tests are completely normal, you will receive your results only by: . MyChart Message (if you have MyChart) OR . A paper copy in the mail If you have any lab test that is abnormal or we need to change your treatment, we will call you to review the results.   Testing/Procedures: NONE    Follow-Up: At [facility], you and your health needs are our priority.  As part of our continuing mission to provide you with exceptional heart care, we have created designated Provider Care Teams.  These Care Teams include your primary Cardiologist (physician) and Advanced Practice Providers (APPs -  Physician Assistants and Nurse Practitioners) who all work together to provide you with the care you need, when you need it.  We recommend signing up for the patient portal called "MyChart".  Sign up information is provided on this After Visit Summary.  MyChart is used to connect with patients for Virtual Visits (Telemedicine).  Patients are able to view lab/test results, encounter notes, upcoming appointments, etc.  Non-urgent messages can be sent to your provider as well.   To learn more about what you can do with MyChart, go to [email or web address].    Your next appointment:   1 year(s)  The format for your next appointment:   In Person  Provider:   Jonathan Branch, MD   Other Instructions Thank you for choosing [facility]!    

## 2019-10-22 NOTE — Progress Notes (Signed)
Clinical Summary Lori Sims is a 59 y.o.female seen today for follow up of the following medical problems.   1. HTN - notes indicate she has felt her bp meds have contributed to insomnia and hair loss.  - has not been able to tolerate clonidine or amlodopine in the past. Norvasc caused leg swelling, chest pain. She does not recall the side effects to clonidien.  - currently on maxide only. - has had some chronic issues with hypokalemia, on chronic replacement.   - recent issues with low bp's - stopped aldactone, lisinopril was lowered to 20mg  daily  - home bp's 110s/60s  - she has been working to losing weight, working out.   2. Carotid stenosis - carotid US mild to mod bilateral stenosis     Completed covid vaccine x 2  Past Medical History:  Diagnosis Date  . GERD (gastroesophageal reflux disease)   . Hypercholesterolemia   . Hypertension      Allergies  Allergen Reactions  . Penicillins   . Tramadol Nausea And Vomiting     Current Outpatient Medications  Medication Sig Dispense Refill  . ALPRAZolam (XANAX) 0.25 MG tablet Take 1 tablet (0.25 mg total) by mouth at bedtime. 30 tablet 3  . atorvastatin (LIPITOR) 20 MG tablet Take one tablet by mouth three times weekly 36 tablet 3  . lisinopril (ZESTRIL) 20 MG tablet Take 1 tablet (20 mg total) by mouth daily. 90 tablet 3  . zolpidem (AMBIEN) 10 MG tablet Take 1 tablet (10 mg total) by mouth at bedtime. 30 tablet 5   No current facility-administered medications for this visit.     Past Surgical History:  Procedure Laterality Date  . CESAREAN SECTION    . COLONOSCOPY  2010   Dr. Laural Golden: normal except external hemorrhoids  . COLONOSCOPY N/A 09/12/2014   Procedure: COLONOSCOPY;  Surgeon: Danie Binder, MD; 2 tubular adenomas and 1 hyperplastic polyp, redundant left colon, external hemorrhoids.  Repeat in 2021 with overtube.  . ESOPHAGEAL DILATION N/A 09/12/2014   Procedure: ESOPHAGEAL DILATION;  Surgeon: Danie Binder, MD;  [facility];  Service: Endoscopy;  Laterality: N/A;  . ESOPHAGOGASTRODUODENOSCOPY N/A 09/12/2014   Procedure: ESOPHAGOGASTRODUODENOSCOPY (EGD);  Surgeon: Danie Binder, MD; normal-appearing esophagus s/p empiric dilation, nonerosive gastritis s/p multiple biopsies, few small polyps in the gastric body s/p polypectomy, normal-appearing duodenum.  Pathology benign.  . fibroid tumor right breast    . fibroid tumors left breast    . OVARIAN CYST DRAINAGE    . OVARIAN CYST REMOVAL       Allergies  Allergen Reactions  . Penicillins   . Tramadol Nausea And Vomiting      Family History  Problem Relation Age of Onset  . Diabetes Mother   . Hypertension Father   . Colon cancer Neg Hx      Social History Ms. Seabright reports that she has never smoked. She has never used smokeless tobacco. Ms. Byers reports current alcohol use.   Review of Systems CONSTITUTIONAL: No weight loss, fever, chills, weakness or fatigue.  HEENT: Eyes: No visual loss, blurred vision, double vision or yellow sclerae.No hearing loss, sneezing, congestion, runny nose or sore throat.  SKIN: No rash or itching.  CARDIOVASCULAR: per hpi RESPIRATORY: No shortness of breath, cough or sputum.  GASTROINTESTINAL: No anorexia, nausea, vomiting or diarrhea. No abdominal pain or blood.  GENITOURINARY: No burning on urination, no polyuria NEUROLOGICAL: No headache, dizziness, syncope, paralysis, ataxia, numbness or  tingling in the extremities. No change in bowel or bladder control.  MUSCULOSKELETAL: No muscle, back pain, joint pain or stiffness.  LYMPHATICS: No enlarged nodes. No history of splenectomy.  PSYCHIATRIC: No history of depression or anxiety.  ENDOCRINOLOGIC: No reports of sweating, cold or heat intolerance. No polyuria or polydipsia.  Marland Kitchen   Physical Examination Today's Vitals   10/22/19 0814  BP: 126/72  Pulse: 78  Temp: (!) 96.5 F (35.8 C)  SpO2: 100%  Weight: 176 lb (79.8 kg)   Height: 5\' 6"  (1.676 m)   Body mass index is 28.41 kg/m.  Gen: resting comfortably, no acute distress HEENT: no scleral icterus, pupils equal round and reactive, no palptable cervical adenopathy,  CV: RRR, 2/6 systolic murmur rusb, no jvd Resp: Clear to auscultation bilaterally GI: abdomen is soft, non-tender, non-distended, normal bowel sounds, no hepatosplenomegaly MSK: extremities are warm, no edema.  Skin: warm, no rash Neuro:  no focal deficits Psych: appropriate affect   Diagnostic Studies  08/2019 echo IMPRESSIONS    1. Left ventricular ejection fraction, by estimation, is 60 to 65%. The  left ventricle has normal function. The left ventricle has no regional  wall motion abnormalities. Left ventricular diastolic parameters were  normal.  2. Right ventricular systolic function is normal. The right ventricular  size is normal.  3. The mitral valve is normal in structure. No evidence of mitral valve  regurgitation. No evidence of mitral stenosis.  4. The aortic valve is tricuspid. Aortic valve regurgitation is not  visualized. No aortic stenosis is present.  5. The inferior vena cava is normal in size with greater than 50%  respiratory variability, suggesting right atrial pressure of 3 mmHg.    09/2019 carotid US Summary:  Right Carotid: Velocities in the right ICA are consistent with a 1-39%  stenosis.   Left Carotid: Velocities in the left ICA are consistent with a 40-59%  stenosis.   Assessment and Plan  1. HTN - recent issues with low bp's. She has lost about 10 lbs since 2019, I suspect bp has come down on its own and prior reigmen was too much - bp's look good today on lisinopril 20mg  daily, continue  2. Carotid stenosis - continue statin, she will start ASA 81mg  daily as well - repeat carotid US 1 year      Arnoldo Lenis, M.D.

## 2019-11-06 NOTE — Patient Instructions (Signed)
Lori Sims  11/06/2019     @PREFPERIOPPHARMACY @   Your procedure is scheduled on  11/14/2019 .  Report to [facility] at  0700  A.M.  Call this number if you have problems the morning of surgery:  [phone]   Remember:  Follow the diet and prep instructions given to you by Dr Roseanne Kaufman office.                       Take these medicines the morning of surgery with A SIP OF WATER  None    Do not wear jewelry, make-up or nail polish.  Do not wear lotions, powders, or perfumes. Please wear deodorant and brush your teeth.  Do not shave 48 hours prior to surgery.  Men may shave face and neck.  Do not bring valuables to the hospital.  [facility] is not responsible for any belongings or valuables.  Contacts, dentures or bridgework may not be worn into surgery.  Leave your suitcase in the car.  After surgery it may be brought to your room.  For patients admitted to the hospital, discharge time will be determined by your treatment team.  Patients discharged the day of surgery will not be allowed to drive home.   Name and phone number of your driver: family Special instructions:  DO NOT smoke the morning of your procedure.  Please read over the following fact sheets that you were given. Anesthesia Post-op Instructions and Care and Recovery After Surgery       Colonoscopy, Adult, Care After This sheet gives you information about how to care for yourself after your procedure. Your health care provider may also give you more specific instructions. If you have problems or questions, contact your health care provider. What can I expect after the procedure? After the procedure, it is common to have:  A small amount of blood in your stool for 24 hours after the procedure.  Some gas.  Mild cramping or bloating of your abdomen. Follow these instructions at home: Eating and drinking   Drink enough fluid to keep your urine pale yellow.  Follow instructions from your health  care provider about eating or drinking restrictions.  Resume your normal diet as instructed by your health care provider. Avoid heavy or fried foods that are hard to digest. Activity  Rest as told by your health care provider.  Avoid sitting for a long time without moving. Get up to take short walks every 1-2 hours. This is important to improve blood flow and breathing. Ask for help if you feel weak or unsteady.  Return to your normal activities as told by your health care provider. Ask your health care provider what activities are safe for you. Managing cramping and bloating   Try walking around when you have cramps or feel bloated.  Apply heat to your abdomen as told by your health care provider. Use the heat source that your health care provider recommends, such as a moist heat pack or a heating pad. ? Place a towel between your skin and the heat source. ? Leave the heat on for 20-30 minutes. ? Remove the heat if your skin turns bright red. This is especially important if you are unable to feel pain, heat, or cold. You may have a greater risk of getting burned. General instructions  For the first 24 hours after the procedure: ? Do not drive or use machinery. ? Do not sign important documents. ?  Do not drink alcohol. ? Do your regular daily activities at a slower pace than normal. ? Eat soft foods that are easy to digest.  Take over-the-counter and prescription medicines only as told by your health care provider.  Keep all follow-up visits as told by your health care provider. This is important. Contact a health care provider if:  You have blood in your stool 2-3 days after the procedure. Get help right away if you have:  More than a small spotting of blood in your stool.  Large blood clots in your stool.  Swelling of your abdomen.  Nausea or vomiting.  A fever.  Increasing pain in your abdomen that is not relieved with medicine. Summary  After the procedure, it is  common to have a small amount of blood in your stool. You may also have mild cramping and bloating of your abdomen.  For the first 24 hours after the procedure, do not drive or use machinery, sign important documents, or drink alcohol.  Get help right away if you have a lot of blood in your stool, nausea or vomiting, a fever, or increased pain in your abdomen. This information is not intended to replace advice given to you by your health care provider. Make sure you discuss any questions you have with your health care provider. Document Revised: 12/24/2018 Document Reviewed: 12/24/2018 Elsevier Patient Education  [place] After These instructions provide you with information about caring for yourself after your procedure. Your health care provider may also give you more specific instructions. Your treatment has been planned according to current medical practices, but problems sometimes occur. Call your health care provider if you have any problems or questions after your procedure. What can I expect after the procedure? After your procedure, you may:  Feel sleepy for several hours.  Feel clumsy and have poor balance for several hours.  Feel forgetful about what happened after the procedure.  Have poor judgment for several hours.  Feel nauseous or vomit.  Have a sore throat if you had a breathing tube during the procedure. Follow these instructions at home: For at least 24 hours after the procedure:      Have a responsible adult stay with you. It is important to have someone help care for you until you are awake and alert.  Rest as needed.  Do not: ? Participate in activities in which you could fall or become injured. ? Drive. ? Use heavy machinery. ? Drink alcohol. ? Take sleeping pills or medicines that cause drowsiness. ? Make important decisions or sign legal documents. ? Take care of children on your own. Eating and  drinking  Follow the diet that is recommended by your health care provider.  If you vomit, drink water, juice, or soup when you can drink without vomiting.  Make sure you have little or no nausea before eating solid foods. General instructions  Take over-the-counter and prescription medicines only as told by your health care provider.  If you have sleep apnea, surgery and certain medicines can increase your risk for breathing problems. Follow instructions from your health care provider about wearing your sleep device: ? Anytime you are sleeping, including during daytime naps. ? While taking prescription pain medicines, sleeping medicines, or medicines that make you drowsy.  If you smoke, do not smoke without supervision.  Keep all follow-up visits as told by your health care provider. This is important. Contact a health care provider if:  You keep  feeling nauseous or you keep vomiting.  You feel light-headed.  You develop a rash.  You have a fever. Get help right away if:  You have trouble breathing. Summary  For several hours after your procedure, you may feel sleepy and have poor judgment.  Have a responsible adult stay with you for at least 24 hours or until you are awake and alert. This information is not intended to replace advice given to you by your health care provider. Make sure you discuss any questions you have with your health care provider. Document Revised: 08/28/2017 Document Reviewed: 09/20/2015 Elsevier Patient Education  [place].

## 2019-11-12 ENCOUNTER — Encounter (HOSPITAL_COMMUNITY): Payer: Self-pay

## 2019-11-12 ENCOUNTER — Encounter (HOSPITAL_COMMUNITY)
Admission: RE | Admit: 2019-11-12 | Discharge: 2019-11-12 | Disposition: A | Discharge status: Home / Self Care | Payer: BC Managed Care – PPO | Source: Ambulatory Visit | Attending: Internal Medicine | Admitting: Internal Medicine

## 2019-11-12 ENCOUNTER — Other Ambulatory Visit: Payer: Self-pay

## 2019-11-12 ENCOUNTER — Other Ambulatory Visit (HOSPITAL_COMMUNITY)
Admission: RE | Admit: 2019-11-12 | Discharge: 2019-11-12 | Disposition: A | Discharge status: Home / Self Care | Payer: BC Managed Care – PPO | Source: Ambulatory Visit | Attending: Internal Medicine | Admitting: Internal Medicine

## 2019-11-12 DIAGNOSIS — Z01812 Encounter for preprocedural laboratory examination: Secondary | ICD-10-CM | POA: Diagnosis not present

## 2019-11-12 DIAGNOSIS — Z20822 Contact with and (suspected) exposure to covid-19: Secondary | ICD-10-CM | POA: Insufficient documentation

## 2019-11-12 LAB — SARS CORONAVIRUS 2 (TAT 6-24 HRS): SARS Coronavirus 2: NEGATIVE

## 2019-11-14 ENCOUNTER — Encounter (HOSPITAL_COMMUNITY): Payer: Self-pay | Admitting: Internal Medicine

## 2019-11-14 ENCOUNTER — Encounter (HOSPITAL_COMMUNITY)
Admission: RE | Disposition: A | Discharge status: Home / Self Care | Payer: Self-pay | Source: Home / Self Care | Attending: Internal Medicine

## 2019-11-14 ENCOUNTER — Ambulatory Visit (HOSPITAL_COMMUNITY): Payer: BC Managed Care – PPO | Admitting: Anesthesiology

## 2019-11-14 ENCOUNTER — Ambulatory Visit (HOSPITAL_COMMUNITY)
Admission: RE | Admit: 2019-11-14 | Discharge: 2019-11-14 | Disposition: A | Discharge status: Home / Self Care | Payer: BC Managed Care – PPO | Attending: Internal Medicine | Admitting: Internal Medicine

## 2019-11-14 ENCOUNTER — Other Ambulatory Visit: Payer: Self-pay

## 2019-11-14 DIAGNOSIS — Z1211 Encounter for screening for malignant neoplasm of colon: Secondary | ICD-10-CM | POA: Diagnosis not present

## 2019-11-14 DIAGNOSIS — Z8249 Family history of ischemic heart disease and other diseases of the circulatory system: Secondary | ICD-10-CM | POA: Diagnosis not present

## 2019-11-14 DIAGNOSIS — Z79899 Other long term (current) drug therapy: Secondary | ICD-10-CM | POA: Diagnosis not present

## 2019-11-14 DIAGNOSIS — I10 Essential (primary) hypertension: Secondary | ICD-10-CM | POA: Insufficient documentation

## 2019-11-14 DIAGNOSIS — E7800 Pure hypercholesterolemia, unspecified: Secondary | ICD-10-CM | POA: Insufficient documentation

## 2019-11-14 DIAGNOSIS — K219 Gastro-esophageal reflux disease without esophagitis: Secondary | ICD-10-CM | POA: Insufficient documentation

## 2019-11-14 DIAGNOSIS — Z880 Allergy status to penicillin: Secondary | ICD-10-CM | POA: Insufficient documentation

## 2019-11-14 DIAGNOSIS — Z885 Allergy status to narcotic agent status: Secondary | ICD-10-CM | POA: Diagnosis not present

## 2019-11-14 DIAGNOSIS — Z86010 Personal history of colonic polyps: Secondary | ICD-10-CM | POA: Diagnosis not present

## 2019-11-14 DIAGNOSIS — Z09 Encounter for follow-up examination after completed treatment for conditions other than malignant neoplasm: Secondary | ICD-10-CM | POA: Diagnosis not present

## 2019-11-14 DIAGNOSIS — Z7982 Long term (current) use of aspirin: Secondary | ICD-10-CM | POA: Diagnosis not present

## 2019-11-14 DIAGNOSIS — G4730 Sleep apnea, unspecified: Secondary | ICD-10-CM | POA: Diagnosis not present

## 2019-11-14 HISTORY — PX: COLONOSCOPY WITH PROPOFOL: SHX5780

## 2019-11-14 SURGERY — COLONOSCOPY WITH PROPOFOL
Anesthesia: General

## 2019-11-14 MED ORDER — LACTATED RINGERS IV SOLN: Freq: Once | INTRAVENOUS | Status: AC

## 2019-11-14 MED ORDER — CHLORHEXIDINE GLUCONATE CLOTH 2 % EX PADS: 6.0000 | MEDICATED_PAD | Freq: Once | CUTANEOUS | Status: DC

## 2019-11-14 MED ORDER — PROPOFOL 10 MG/ML IV BOLUS
INTRAVENOUS | Status: DC | PRN
  Administered 2019-11-14: 100 mg via INTRAVENOUS
  Administered 2019-11-14: 30 mg via INTRAVENOUS
  Administered 2019-11-14 (×2): 50 mg via INTRAVENOUS

## 2019-11-14 MED ORDER — PROPOFOL 10 MG/ML IV BOLUS
INTRAVENOUS | Status: AC
  Filled 2019-11-14: qty 40

## 2019-11-14 MED ORDER — LACTATED RINGERS IV SOLN: INTRAVENOUS | Status: DC | PRN

## 2019-11-14 NOTE — Op Note (Signed)
[facility] Patient Name: Lori Sims Procedure Date: 11/14/2019 8:28 AM MRN: [id number] Date of Birth: [birth year] Attending MD: Norvel Richards , MD CSN: [id number] Age: 59 Admit Type: Outpatient Procedure:                Colonoscopy Indications:              High risk colon cancer surveillance: Personal                            history of colonic polyps Providers:                Norvel Richards, MD, Otis Peak B. Sharon Seller, RN,                            Randa Spike, Technician Referring MD:              Medicines:                Propofol per Anesthesia Complications:            No immediate complications. Estimated Blood Loss:     Estimated blood loss: none. Procedure:                Pre-Anesthesia Assessment:                           - Prior to the procedure, a History and Physical                            was performed, and patient medications and                            allergies were reviewed. The patient's tolerance of                            previous anesthesia was also reviewed. The risks                            and benefits of the procedure and the sedation                            options and risks were discussed with the patient.                            All questions were answered, and informed consent                            was obtained. Prior Anticoagulants: The patient has                            taken no previous anticoagulant or antiplatelet                            agents. ASA Grade Assessment: II - A patient with  mild systemic disease. After reviewing the risks                            and benefits, the patient was deemed in                            satisfactory condition to undergo the procedure.                           After obtaining informed consent, the colonoscope                            was passed under direct vision. Throughout the                            procedure, the  patient's blood pressure, pulse, and                            oxygen saturations were monitored continuously. The                            [id number]) scope was introduced through                            the anus and advanced to the the cecum, identified                            by appendiceal orifice and ileocecal valve. The                            colonoscopy was performed without difficulty. The                            patient tolerated the procedure well. The quality                            of the bowel preparation was adequate. Scope In: 8:57:42 AM Scope Out: 9:08:49 AM Scope Withdrawal Time: 0 hours 7 minutes 22 seconds  Total Procedure Duration: 0 hours 11 minutes 7 seconds  Findings:      The perianal and digital rectal examinations were normal.      The colon (entire examined portion) appeared normal.      The retroflexed view of the distal rectum and anal verge was normal and       showed no anal or rectal abnormalities. Impression:               - The entire examined colon is normal.                           - The distal rectum and anal verge are normal on                            retroflexion view.                           -  No specimens collected. Moderate Sedation:      Moderate (conscious) sedation was personally administered by an       anesthesia professional. The following parameters were monitored: oxygen       saturation, heart rate, blood pressure, respiratory rate, EKG, adequacy       of pulmonary ventilation, and response to care. Recommendation:           - Patient has a contact number available for                            emergencies. The signs and symptoms of potential                            delayed complications were discussed with the                            patient. Return to normal activities tomorrow.                            Written discharge instructions were provided to the                            patient.                            - Resume previous diet.                           - Continue present medications.                           - Repeat colonoscopy in 5 years for surveillance.                           - Return to GI office (date not yet determined). Procedure Code(s):        --- Professional ---                           [id number], Colonoscopy, flexible; diagnostic, including                            collection of specimen(s) by brushing or washing,                            when performed (separate procedure) Diagnosis Code(s):        --- Professional ---                           Z86.010, Personal history of colonic polyps CPT copyright 2019 American Medical Association. All rights reserved. The codes documented in this report are preliminary and upon coder review may  be revised to meet current compliance requirements. Cristopher Estimable. Brynlyn Dade, MD Norvel Richards, MD 11/14/2019 9:40:06 AM This report has been signed electronically. Number of Addenda: 0

## 2019-11-14 NOTE — Anesthesia Postprocedure Evaluation (Signed)
Anesthesia Post Note  Patient: Lori Sims  Procedure(s) Performed: COLONOSCOPY WITH PROPOFOL (N/A )  Patient location during evaluation: PACU Anesthesia Type: General Level of consciousness: oriented, awake, awake and alert and patient cooperative Pain management: pain level controlled Vital Signs Assessment: post-procedure vital signs reviewed and stable Respiratory status: spontaneous breathing, nonlabored ventilation and respiratory function stable Cardiovascular status: blood pressure returned to baseline and stable Postop Assessment: no headache and no backache Anesthetic complications: no     Last Vitals:  Vitals:   11/14/19 0724  BP: 114/64  Pulse: 62  Resp: 16  Temp: 37.1 C  SpO2: 100%    Last Pain:  Vitals:   11/14/19 0855  TempSrc:   PainSc: 0-No pain                 Tacy Learn

## 2019-11-14 NOTE — H&P (Signed)
@LOGO @   Primary Care Physician:  Fayrene Helper, MD Primary Gastroenterologist:  Dr. Gala Romney  Pre-Procedure History & Physical: HPI:  Lori Sims is a 59 y.o. female here for surveillance colonoscopy.  History of multiple colonic adenomas removed in 2016.  Past Medical History:  Diagnosis Date  . GERD (gastroesophageal reflux disease)   . Hypercholesterolemia   . Hypertension     Past Surgical History:  Procedure Laterality Date  . CESAREAN SECTION    . COLONOSCOPY  2010   Dr. Laural Golden: normal except external hemorrhoids  . COLONOSCOPY N/A 09/12/2014   Procedure: COLONOSCOPY;  Surgeon: Danie Binder, MD; 2 tubular adenomas and 1 hyperplastic polyp, redundant left colon, external hemorrhoids.  Repeat in 2021 with overtube.  . ESOPHAGEAL DILATION N/A 09/12/2014   Procedure: ESOPHAGEAL DILATION;  Surgeon: Danie Binder, MD;  Location: [facility];  Service: Endoscopy;  Laterality: N/A;  . ESOPHAGOGASTRODUODENOSCOPY N/A 09/12/2014   Procedure: ESOPHAGOGASTRODUODENOSCOPY (EGD);  Surgeon: Danie Binder, MD; normal-appearing esophagus s/p empiric dilation, nonerosive gastritis s/p multiple biopsies, few small polyps in the gastric body s/p polypectomy, normal-appearing duodenum.  Pathology benign.  . fibroid tumor right breast    . fibroid tumors left breast    . OVARIAN CYST DRAINAGE    . OVARIAN CYST REMOVAL      Prior to Admission medications   Medication Sig Start Date End Date Taking? Authorizing Provider  aspirin EC 81 MG tablet Take 81 mg by mouth daily.   Yes [provider]  atorvastatin (LIPITOR) 20 MG tablet Take one tablet by mouth three times weekly Patient taking differently: Take 20 mg by mouth daily. Take one tablet by mouth three times weekly 09/23/19  Yes Perlie Mayo, NP  Calcium-Magnesium-Zinc (CAL-MAG-ZINC PO) Take 1 tablet by mouth daily.   Yes [provider]  cholecalciferol (QC VITAMIN D3) 10 MCG (400 UNIT) TABS tablet Take 400 Units by  mouth daily.   Yes [provider]  Ferrous Gluconate (IRON 27 PO) Take 27 mg by mouth daily.   Yes [provider]  lisinopril (ZESTRIL) 20 MG tablet Take 1 tablet (20 mg total) by mouth daily. 08/29/19 11/27/19 Yes Verta Ellen., NP  zolpidem (AMBIEN) 10 MG tablet Take 1 tablet (10 mg total) by mouth at bedtime. 07/15/19 10/30/20 Yes Fayrene Helper, MD    Allergies as of 08/19/2019 - Review Complete 08/19/2019  Allergen Reaction Noted  . Penicillins  04/28/2008  . Tramadol Nausea And Vomiting 07/18/2014    Family History  Problem Relation Age of Onset  . Diabetes Mother   . Hypertension Father   . Colon cancer Neg Hx     Social History   Socioeconomic History  . Marital status: Divorced    Spouse name: Not on file  . Number of children: Not on file  . Years of education: Not on file  . Highest education level: Not on file  Occupational History  . Occupation: [employer]  Tobacco Use  . Smoking status: Never Smoker  . Smokeless tobacco: Never Used  Substance and Sexual Activity  . Alcohol use: Yes    Alcohol/week: 0.0 standard drinks    Comment: rare wine  . Drug use: No  . Sexual activity: Not on file  Other Topics Concern  . Not on file  Social History Narrative  . Not on file   Social Determinants of Health   Financial Resource Strain:   . Difficulty of Paying Living Expenses:  Food Insecurity:   . Worried About Charity fundraiser in the Last Year:   . Arboriculturist in the Last Year:   Transportation Needs:   . Film/video editor (Medical):   Marland Kitchen Lack of Transportation (Non-Medical):   Physical Activity:   . Days of Exercise per Week:   . Minutes of Exercise per Session:   Stress:   . Feeling of Stress :   Social Connections:   . Frequency of Communication with Friends and Family:   . Frequency of Social Gatherings with Friends and Family:   . Attends Religious Services:   . Active Member of Clubs or Organizations:   . Attends  Archivist Meetings:   Marland Kitchen Marital Status:   Intimate Partner Violence:   . Fear of Current or Ex-Partner:   . Emotionally Abused:   Marland Kitchen Physically Abused:   . Sexually Abused:     Review of Systems: See HPI, otherwise negative ROS  Physical Exam: BP 114/64   Pulse 62   Temp 98.7 F (37.1 C) (Oral)   Resp 16   Ht 5\' 6"  (1.676 m)   Wt 79.8 kg   LMP 08/26/2014   SpO2 100%   BMI 28.41 kg/m  General:   Alert,  Well-developed, well-nourished, pleasant and cooperative in NAD Neck:  Supple; no masses or thyromegaly. No significant cervical adenopathy. Lungs:  Clear throughout to auscultation.   No wheezes, crackles, or rhonchi. No acute distress. Heart:  Regular rate and rhythm; no murmurs, clicks, rubs,  or gallops. Abdomen: Non-distended, normal bowel sounds.  Soft and nontender without appreciable mass or hepatosplenomegaly.  Pulses:  Normal pulses noted. Extremities:  Without clubbing or edema.  Impression/Plan: 59 year old lady with history colonic adenomas removed by Dr. Oneida Alar previously.  Here for surveillance colonoscopy per plan.  The risks, benefits, limitations, alternatives and imponderables have been reviewed with the patient. Questions have been answered. All parties are agreeable.      Notice: This dictation was prepared with Dragon dictation along with smaller phrase technology. Any transcriptional errors that result from this process are unintentional and may not be corrected upon review.

## 2019-11-14 NOTE — Discharge Instructions (Signed)
Colonoscopy Discharge Instructions  Read the instructions outlined below and refer to this sheet in the next few weeks. These discharge instructions provide you with general information on caring for yourself after you leave the hospital. Your doctor may also give you specific instructions. While your treatment has been planned according to the most current medical practices available, unavoidable complications occasionally occur. If you have any problems or questions after discharge, call Dr. Gala Romney at [phone]. ACTIVITY  You may resume your regular activity, but move at a slower pace for the next 24 hours.   Take frequent rest periods for the next 24 hours.   Walking will help get rid of the air and reduce the bloated feeling in your belly (abdomen).   No driving for 24 hours (because of the medicine (anesthesia) used during the test).    Do not sign any important legal documents or operate any machinery for 24 hours (because of the anesthesia used during the test).  NUTRITION  Drink plenty of fluids.   You may resume your normal diet as instructed by your doctor.   Begin with a light meal and progress to your normal diet. Heavy or fried foods are harder to digest and may make you feel sick to your stomach (nauseated).   Avoid alcoholic beverages for 24 hours or as instructed.  MEDICATIONS  You may resume your normal medications unless your doctor tells you otherwise.  WHAT YOU CAN EXPECT TODAY  Some feelings of bloating in the abdomen.   Passage of more gas than usual.   Spotting of blood in your stool or on the toilet paper.  IF YOU HAD POLYPS REMOVED DURING THE COLONOSCOPY:  No aspirin products for 7 days or as instructed.   No alcohol for 7 days or as instructed.   Eat a soft diet for the next 24 hours.  FINDING OUT THE RESULTS OF YOUR TEST Not all test results are available during your visit. If your test results are not back during the visit, make an appointment  with your caregiver to find out the results. Do not assume everything is normal if you have not heard from your caregiver or the medical facility. It is important for you to follow up on all of your test results.  SEEK IMMEDIATE MEDICAL ATTENTION IF:  You have more than a spotting of blood in your stool.   Your belly is swollen (abdominal distention).   You are nauseated or vomiting.   You have a temperature over 101.   You have abdominal pain or discomfort that is severe or gets worse throughout the day.   Your colonoscopy was normal today.  I recommend a repeat colonoscopy in 5 years  At patient request I called [facility] at [phone] and reviewed results.  PATIENT INSTRUCTIONS POST-ANESTHESIA  IMMEDIATELY FOLLOWING SURGERY:  Do not drive or operate machinery for the first twenty four hours after surgery.  Do not make any important decisions for twenty four hours after surgery or while taking narcotic pain medications or sedatives.  If you develop intractable nausea and vomiting or a severe headache please notify your doctor immediately.  FOLLOW-UP:  Please make an appointment with your surgeon as instructed. You do not need to follow up with anesthesia unless specifically instructed to do so.  WOUND CARE INSTRUCTIONS (if applicable):  Keep a dry clean dressing on the anesthesia/puncture wound site if there is drainage.  Once the wound has quit draining you may leave it open to air.  Generally  you should leave the bandage intact for twenty four hours unless there is drainage.  If the epidural site drains for more than 36-48 hours please call the anesthesia department.  QUESTIONS?:  Please feel free to call your physician or the hospital operator if you have any questions, and they will be happy to assist you.

## 2019-11-14 NOTE — Anesthesia Preprocedure Evaluation (Addendum)
Anesthesia Evaluation  Patient identified by MRN, date of birth, ID band Patient awake    Reviewed: Allergy & Precautions, NPO status , Patient's Chart, lab work & pertinent test results  History of Anesthesia Complications Negative for: history of anesthetic complications  Airway Mallampati: III  TM Distance: >3 FB Neck ROM: Full    Dental  (+) Teeth Intact, Dental Advisory Given   Pulmonary sleep apnea (non complaint with CPAP) ,    Pulmonary exam normal breath sounds clear to auscultation       Cardiovascular hypertension, Pt. on medications Normal cardiovascular exam Rhythm:Regular Rate:Normal     Neuro/Psych PSYCHIATRIC DISORDERS Depression    GI/Hepatic Neg liver ROS, GERD  Medicated,  Endo/Other  negative endocrine ROS  Renal/GU negative Renal ROS     Musculoskeletal   Abdominal   Peds  Hematology  (+) anemia ,   Anesthesia Other Findings   Reproductive/Obstetrics                             Anesthesia Physical Anesthesia Plan  ASA: III  Anesthesia Plan: General   Post-op Pain Management:    Induction: Intravenous  PONV Risk Score and Plan: 2 and TIVA  Airway Management Planned: Nasal Cannula, Natural Airway and Simple Face Mask  Additional Equipment:   Intra-op Plan:   Post-operative Plan:   Informed Consent: I have reviewed the patients History and Physical, chart, labs and discussed the procedure including the risks, benefits and alternatives for the proposed anesthesia with the patient or authorized representative who has indicated his/her understanding and acceptance.     Dental advisory given  Plan Discussed with: CRNA and Surgeon  Anesthesia Plan Comments:         Anesthesia Quick Evaluation

## 2019-11-14 NOTE — Transfer of Care (Signed)
Immediate Anesthesia Transfer of Care Note  Patient: Lori Sims  Procedure(s) Performed: COLONOSCOPY WITH PROPOFOL (N/A )  Patient Location: PACU  Anesthesia Type:MAC  Level of Consciousness: awake, alert , oriented and patient cooperative  Airway & Oxygen Therapy: Patient Spontanous Breathing and Patient connected to nasal cannula oxygen  Post-op Assessment: Report given to RN, Post -op Vital signs reviewed and stable and Patient moving all extremities  Post vital signs: Reviewed and stable  Last Vitals:  Vitals Value Taken Time  BP 95/47 11/14/19 0912  Temp    Pulse 64 11/14/19 0913  Resp 18 11/14/19 0913  SpO2 100 % 11/14/19 0913  Vitals shown include unvalidated device data.  Last Pain:  Vitals:   11/14/19 0855  TempSrc:   PainSc: 0-No pain      Patients Stated Pain Goal: 9 (32/99/24 2683)  Complications: No apparent anesthesia complications

## 2019-12-23 DIAGNOSIS — Z124 Encounter for screening for malignant neoplasm of cervix: Secondary | ICD-10-CM | POA: Diagnosis not present

## 2019-12-23 DIAGNOSIS — Z1151 Encounter for screening for human papillomavirus (HPV): Secondary | ICD-10-CM | POA: Diagnosis not present

## 2019-12-23 DIAGNOSIS — Z01419 Encounter for gynecological examination (general) (routine) without abnormal findings: Secondary | ICD-10-CM | POA: Diagnosis not present

## 2019-12-23 DIAGNOSIS — Z6827 Body mass index (BMI) 27.0-27.9, adult: Secondary | ICD-10-CM | POA: Diagnosis not present

## 2020-01-15 ENCOUNTER — Other Ambulatory Visit: Payer: Self-pay | Admitting: Family Medicine

## 2020-01-16 ENCOUNTER — Other Ambulatory Visit: Payer: Self-pay | Admitting: Family Medicine

## 2020-01-16 MED ORDER — ZOLPIDEM TARTRATE 10 MG PO TABS: 10.0000 mg | ORAL_TABLET | Freq: Every day | ORAL | 0 refills | Status: DC

## 2020-01-17 ENCOUNTER — Other Ambulatory Visit: Payer: Self-pay | Admitting: Family Medicine

## 2020-01-20 ENCOUNTER — Telehealth: Payer: Self-pay

## 2020-01-20 DIAGNOSIS — R87612 Low grade squamous intraepithelial lesion on cytologic smear of cervix (LGSIL): Secondary | ICD-10-CM | POA: Diagnosis not present

## 2020-01-20 NOTE — Telephone Encounter (Signed)
Ambien sent to pharmacy 

## 2020-01-20 NOTE — Telephone Encounter (Signed)
Pt needing refill on Ambien

## 2020-01-30 ENCOUNTER — Ambulatory Visit (INDEPENDENT_AMBULATORY_CARE_PROVIDER_SITE_OTHER): Payer: BC Managed Care – PPO | Admitting: Family Medicine

## 2020-01-30 ENCOUNTER — Other Ambulatory Visit (HOSPITAL_COMMUNITY)
Admission: RE | Admit: 2020-01-30 | Discharge: 2020-01-30 | Disposition: A | Discharge status: Home / Self Care | Payer: BC Managed Care – PPO | Source: Ambulatory Visit

## 2020-01-30 ENCOUNTER — Other Ambulatory Visit: Payer: Self-pay

## 2020-01-30 ENCOUNTER — Encounter: Payer: Self-pay | Admitting: Family Medicine

## 2020-01-30 VITALS — BP 125/67 | HR 67 | Resp 15 | Ht 66.0 in | Wt 175.0 lb

## 2020-01-30 DIAGNOSIS — I10 Essential (primary) hypertension: Secondary | ICD-10-CM | POA: Diagnosis not present

## 2020-01-30 DIAGNOSIS — F5104 Psychophysiologic insomnia: Secondary | ICD-10-CM | POA: Diagnosis not present

## 2020-01-30 DIAGNOSIS — R87619 Unspecified abnormal cytological findings in specimens from cervix uteri: Secondary | ICD-10-CM

## 2020-01-30 DIAGNOSIS — R319 Hematuria, unspecified: Secondary | ICD-10-CM

## 2020-01-30 DIAGNOSIS — E663 Overweight: Secondary | ICD-10-CM

## 2020-01-30 DIAGNOSIS — E782 Mixed hyperlipidemia: Secondary | ICD-10-CM

## 2020-01-30 LAB — URINALYSIS, ROUTINE W REFLEX MICROSCOPIC
Bilirubin Urine: NEGATIVE
Glucose, UA: NEGATIVE mg/dL
Hgb urine dipstick: NEGATIVE
Ketones, ur: NEGATIVE mg/dL
Leukocytes,Ua: NEGATIVE
Nitrite: NEGATIVE
Protein, ur: NEGATIVE mg/dL
Specific Gravity, Urine: 1.021 (ref 1.005–1.030)
pH: 7 (ref 5.0–8.0)

## 2020-01-30 LAB — POCT URINALYSIS DIP (CLINITEK)
Bilirubin, UA: NEGATIVE
Glucose, UA: NEGATIVE mg/dL
Ketones, POC UA: NEGATIVE mg/dL
Leukocytes, UA: NEGATIVE
Nitrite, UA: NEGATIVE
POC PROTEIN,UA: NEGATIVE
Spec Grav, UA: 1.02 (ref 1.010–1.025)
Urobilinogen, UA: 0.2 E.U./dL
pH, UA: 7 (ref 5.0–8.0)

## 2020-01-30 MED ORDER — TRAZODONE HCL 50 MG PO TABS: 25.0000 mg | ORAL_TABLET | Freq: Every evening | ORAL | 3 refills | Status: DC | PRN

## 2020-01-30 MED ORDER — ZOLPIDEM TARTRATE 10 MG PO TABS
10.0000 mg | ORAL_TABLET | Freq: Every day | ORAL | 5 refills | Status: DC
Start: 2020-02-19 — End: 2020-08-18

## 2020-01-30 NOTE — Patient Instructions (Addendum)
F/U in office with MD in 5.5 months, call if you need me sooner  Urine will be sent for additional testing, please also make sure that you discuss spotting with gyne   Lipid, cmp and eGFr fasting please   New additional medication for sleep is trazodone half to one tablet at bedtime and continue zolpidem as before   It is important that you exercise regularly at least 30 minutes 5 times a week. If you develop chest pain, have severe difficulty breathing, or feel very tired, stop exercising immediately and seek medical attention   Think about what you will eat, plan ahead. Choose " clean, green, fresh or frozen" over canned, processed or packaged foods which are more sugary, salty and fatty. 70 to 75% of food eaten should be vegetables and fruit. Three meals at set times with snacks allowed between meals, but they must be fruit or vegetables. Aim to eat over a 12 hour period , example 7 am to 7 pm, and STOP after  your last meal of the day. Drink water,generally about 64 ounces per day, no other drink is as healthy. Fruit juice is best enjoyed in a healthy way, by EATING the fruit.  Thanks for choosing [facility], we consider it a privelige to serve you.

## 2020-01-31 ENCOUNTER — Other Ambulatory Visit: Payer: Self-pay | Admitting: Family Medicine

## 2020-01-31 DIAGNOSIS — I10 Essential (primary) hypertension: Secondary | ICD-10-CM | POA: Diagnosis not present

## 2020-01-31 DIAGNOSIS — E782 Mixed hyperlipidemia: Secondary | ICD-10-CM | POA: Diagnosis not present

## 2020-01-31 LAB — LIPID PANEL
Cholesterol: 150 mg/dL (ref <200)
HDL: 56 mg/dL (ref >=50)
LDL Cholesterol (Calc): 78 mg/dL
Non-HDL Cholesterol (Calc): 94 mg/dL (ref <130)
Total CHOL/HDL Ratio: 2.7 (calc) (ref <5.0)
Triglycerides: 77 mg/dL (ref <150)

## 2020-01-31 LAB — COMPLETE METABOLIC PANEL WITH GFR
AG Ratio: 1.6 (calc) (ref 1.0–2.5)
ALT: 12 U/L (ref 6–29)
AST: 18 U/L (ref 10–35)
Albumin: 3.9 g/dL (ref 3.6–5.1)
Alkaline phosphatase (APISO): 70 U/L (ref 37–153)
BUN/Creatinine Ratio: 14 (calc) (ref 6–22)
BUN: 16 mg/dL (ref 7–25)
CO2: 29 mmol/L (ref 20–32)
Calcium: 9 mg/dL (ref 8.6–10.4)
Chloride: 108 mmol/L (ref 98–110)
Creat: 1.18 mg/dL — ABNORMAL HIGH (ref 0.50–1.05)
GFR, Est African American: 58 mL/min/{1.73_m2} — ABNORMAL LOW (ref >=60)
GFR, Est Non African American: 50 mL/min/{1.73_m2} — ABNORMAL LOW (ref >=60)
Globulin: 2.5 g/dL (calc) (ref 1.9–3.7)
Glucose, Bld: 83 mg/dL (ref 65–99)
Potassium: 4 mmol/L (ref 3.5–5.3)
Sodium: 144 mmol/L (ref 135–146)
Total Bilirubin: 0.3 mg/dL (ref 0.2–1.2)
Total Protein: 6.4 g/dL (ref 6.1–8.1)

## 2020-02-02 ENCOUNTER — Encounter: Payer: Self-pay | Admitting: Family Medicine

## 2020-02-02 DIAGNOSIS — R319 Hematuria, unspecified: Secondary | ICD-10-CM | POA: Insufficient documentation

## 2020-02-02 NOTE — Progress Notes (Signed)
Lori Sims     MRN: [id number]      DOB: 1960/11/12   HPI Lori Sims is here for follow up and re-evaluation of chronic medical conditions, medication management and review of any available recent lab and radiology data.  Preventive health is updated, specifically  Cancer screening and Immunization.   Had colposcopy recently and still experiencing spotting The PT denies any adverse reactions to current medications since the last visit.  Still poor sleep at most 4 hr/ night   ROS Denies recent fever or chills. Denies sinus pressure, nasal congestion, ear pain or sore throat. Denies chest congestion, productive cough or wheezing. Denies chest pains, palpitations and leg swelling Denies abdominal pain, nausea, vomiting,diarrhea or constipation.   Denies dysuria, frequency, hesitancy or incontinence. Denies joint pain, swelling and limitation in mobility. Denies headaches, seizures, numbness, or tingling. Denies depression, anxiety or insomnia. Denies skin break down or rash.   PE  BP 125/67   Pulse 67   Resp 15   Ht 5\' 6"  (1.676 m)   Wt 175 lb (79.4 kg)   LMP 08/26/2014   SpO2 97%   BMI 28.25 kg/m   Patient alert and oriented and in no cardiopulmonary distress.  HEENT: No facial asymmetry, EOMI,     Neck supple .  Chest: Clear to auscultation bilaterally.  CVS: S1, S2 no murmurs, no S3.Regular rate.  ABD: Soft non tender.   Ext: No edema  MS: Adequate ROM spine, shoulders, hips and knees.  Skin: Intact, no ulcerations or rash noted.  Psych: Good eye contact, normal affect. Memory intact not anxious or depressed appearing.  CNS: CN 2-12 intact, power,  normal throughout.no focal deficits noted.   Assessment & Plan  Insomnia Uncontrolled, add trazodone, continue zolpidem Sleep hygiene reviewed and written information offered also. Prescription sent for  medication needed.   Overweight (BMI 25.0-29.9)  Patient re-educated about  the importance of  commitment to a  minimum of 150 minutes of exercise per week as able.  The importance of healthy food choices with portion control discussed, as well as eating regularly and within a 12 hour window most days. The need to choose "clean , green" food 50 to 75% of the time is discussed, as well as to make water the primary drink and set a goal of 64 ounces water daily.    Weight /BMI 01/30/2020 11/14/2019 10/22/2019  WEIGHT 175 lb 176 lb 176 lb  HEIGHT 5\' 6"  5\' 6"  5\' 6"   BMI 28.25 kg/m2 28.41 kg/m2 28.41 kg/m2      Essential hypertension Controlled, no change in medication DASH diet and commitment to daily physical activity for a minimum of 30 minutes discussed and encouraged, as a part of hypertension management. The importance of attaining a healthy weight is also discussed.  BP/Weight 01/30/2020 11/14/2019 10/22/2019 08/29/2019 08/21/2019 02/14/7168 11/17/8936  Systolic BP 101 751 025 852 778 242 353  Diastolic BP 67 70 72 71 74 56 72  Wt. (Lbs) 175 176 176 177 176 176.2 177.12  BMI 28.25 28.41 28.41 28.57 28.41 28.44 28.59       Hyperlipidemia Hyperlipidemia:Low fat diet discussed and encouraged.   Lipid Panel  Lab Results  Component Value Date   CHOL 150 01/31/2020   HDL 56 01/31/2020   LDLCALC 78 01/31/2020   TRIG 77 01/31/2020   CHOLHDL 2.7 01/31/2020   Controlled, no change in medication     Hematuria H/o spotting and blood post colposcopy, CCUA , slight blood send  for additional testing

## 2020-02-02 NOTE — Assessment & Plan Note (Signed)
Hyperlipidemia:Low fat diet discussed and encouraged.   Lipid Panel  Lab Results  Component Value Date   CHOL 150 01/31/2020   HDL 56 01/31/2020   LDLCALC 78 01/31/2020   TRIG 77 01/31/2020   CHOLHDL 2.7 01/31/2020   Controlled, no change in medication

## 2020-02-02 NOTE — Assessment & Plan Note (Signed)
Uncontrolled, add trazodone, continue zolpidem Sleep hygiene reviewed and written information offered also. Prescription sent for  medication needed.

## 2020-02-02 NOTE — Assessment & Plan Note (Signed)
Controlled, no change in medication DASH diet and commitment to daily physical activity for a minimum of 30 minutes discussed and encouraged, as a part of hypertension management. The importance of attaining a healthy weight is also discussed.  BP/Weight 01/30/2020 11/14/2019 10/22/2019 08/29/2019 08/21/2019 02/17/4717 10/15/156  Systolic BP 682 574 935 521 747 159 539  Diastolic BP 67 70 72 71 74 56 72  Wt. (Lbs) 175 176 176 177 176 176.2 177.12  BMI 28.25 28.41 28.41 28.57 28.41 28.44 28.59

## 2020-02-02 NOTE — Assessment & Plan Note (Addendum)
H/o spotting and blood post colposcopy, CCUA , slight blood send for additional testing

## 2020-02-02 NOTE — Assessment & Plan Note (Signed)
  Patient re-educated about  the importance of commitment to a  minimum of 150 minutes of exercise per week as able.  The importance of healthy food choices with portion control discussed, as well as eating regularly and within a 12 hour window most days. The need to choose "clean , green" food 50 to 75% of the time is discussed, as well as to make water the primary drink and set a goal of 64 ounces water daily.    Weight /BMI 01/30/2020 11/14/2019 10/22/2019  WEIGHT 175 lb 176 lb 176 lb  HEIGHT 5\' 6"  5\' 6"  5\' 6"   BMI 28.25 kg/m2 28.41 kg/m2 28.41 kg/m2

## 2020-02-20 ENCOUNTER — Other Ambulatory Visit: Payer: Self-pay | Admitting: Family Medicine

## 2020-02-20 ENCOUNTER — Encounter: Payer: Self-pay | Admitting: Family Medicine

## 2020-03-22 ENCOUNTER — Other Ambulatory Visit: Payer: Self-pay | Admitting: Family Medicine

## 2020-03-27 ENCOUNTER — Encounter: Payer: Self-pay | Admitting: Family Medicine

## 2020-03-28 ENCOUNTER — Other Ambulatory Visit: Payer: Self-pay | Admitting: Family Medicine

## 2020-03-28 MED ORDER — TRAZODONE HCL 50 MG PO TABS: 25.0000 mg | ORAL_TABLET | Freq: Every evening | ORAL | 3 refills | Status: DC | PRN

## 2020-03-30 ENCOUNTER — Other Ambulatory Visit (HOSPITAL_COMMUNITY): Payer: Self-pay | Admitting: Family Medicine

## 2020-03-30 DIAGNOSIS — Z1231 Encounter for screening mammogram for malignant neoplasm of breast: Secondary | ICD-10-CM

## 2020-04-22 ENCOUNTER — Ambulatory Visit (HOSPITAL_COMMUNITY)
Admission: RE | Admit: 2020-04-22 | Discharge: 2020-04-22 | Disposition: A | Discharge status: Home / Self Care | Payer: BC Managed Care – PPO | Source: Ambulatory Visit | Attending: Family Medicine | Admitting: Family Medicine

## 2020-04-22 ENCOUNTER — Other Ambulatory Visit: Payer: Self-pay

## 2020-04-22 DIAGNOSIS — Z1231 Encounter for screening mammogram for malignant neoplasm of breast: Secondary | ICD-10-CM | POA: Insufficient documentation

## 2020-04-23 ENCOUNTER — Ambulatory Visit: Payer: BC Managed Care – PPO | Attending: Internal Medicine

## 2020-04-23 DIAGNOSIS — Z23 Encounter for immunization: Secondary | ICD-10-CM

## 2020-04-23 NOTE — Progress Notes (Signed)
   Covid-19 Vaccination Clinic  Name:  Lori Sims    MRN: [id number] DOB: 1961-04-08  04/23/2020  Lori Sims was observed post Covid-19 immunization for 15 minutes without incident. She was provided with Vaccine Information Sheet and instruction to access the V-Safe system.   Lori Sims was instructed to call 911 with any severe reactions post vaccine: Marland Kitchen Difficulty breathing  . Swelling of face and throat  . A fast heartbeat  . A bad rash all over body  . Dizziness and weakness

## 2020-04-24 ENCOUNTER — Other Ambulatory Visit (HOSPITAL_COMMUNITY): Payer: Self-pay | Admitting: Family Medicine

## 2020-04-24 DIAGNOSIS — R928 Other abnormal and inconclusive findings on diagnostic imaging of breast: Secondary | ICD-10-CM

## 2020-05-05 ENCOUNTER — Ambulatory Visit (HOSPITAL_COMMUNITY)
Admission: RE | Admit: 2020-05-05 | Discharge: 2020-05-05 | Disposition: A | Discharge status: Home / Self Care | Payer: BC Managed Care – PPO | Source: Ambulatory Visit | Attending: Family Medicine | Admitting: Family Medicine

## 2020-05-05 ENCOUNTER — Other Ambulatory Visit: Payer: Self-pay

## 2020-05-05 DIAGNOSIS — N6002 Solitary cyst of left breast: Secondary | ICD-10-CM | POA: Diagnosis not present

## 2020-05-05 DIAGNOSIS — R928 Other abnormal and inconclusive findings on diagnostic imaging of breast: Secondary | ICD-10-CM | POA: Insufficient documentation

## 2020-06-18 ENCOUNTER — Other Ambulatory Visit: Payer: Self-pay | Admitting: Family Medicine

## 2020-07-05 ENCOUNTER — Encounter: Payer: Self-pay | Admitting: Emergency Medicine

## 2020-07-05 ENCOUNTER — Ambulatory Visit
Admission: EM | Admit: 2020-07-05 | Discharge: 2020-07-05 | Disposition: A | Discharge status: Home / Self Care | Payer: BC Managed Care – PPO | Attending: Family Medicine | Admitting: Family Medicine

## 2020-07-05 ENCOUNTER — Other Ambulatory Visit: Payer: Self-pay

## 2020-07-05 DIAGNOSIS — M5412 Radiculopathy, cervical region: Secondary | ICD-10-CM

## 2020-07-05 MED ORDER — CYCLOBENZAPRINE HCL 5 MG PO TABS: 5.0000 mg | ORAL_TABLET | Freq: Three times a day (TID) | ORAL | 0 refills | Status: DC | PRN

## 2020-07-05 MED ORDER — PREDNISONE 10 MG (21) PO TBPK: ORAL_TABLET | Freq: Every day | ORAL | 0 refills | Status: AC

## 2020-07-05 NOTE — ED Triage Notes (Signed)
Pain started on 1/10 on right side of neck.  Hurts down into shoulder and to back of neck.  Has been taking baclofen 5 mg TID for pain since 1/20

## 2020-07-05 NOTE — Discharge Instructions (Signed)
I have sent in a prednisone taper for you to take for 6 days. 6 tablets on day one, 5 tablets on day two, 4 tablets on day three, 3 tablets on day four, 2 tablets on day five, and 1 tablet on day six.  I have sent in flexeril for you to take twice a day as needed for muscle spasms. This medication can make you sleepy. Do not drive or operate heavy machinery with this medication.  Follow up with this office or with primary care if symptoms are persisting.  Follow up in the ER for high fever, trouble swallowing, trouble breathing, other concerning symptoms.

## 2020-07-05 NOTE — ED Provider Notes (Signed)
[place]   [id number] 07/05/20 Arrival Time: 1142  MP:NTIRW PAIN  SUBJECTIVE: History from: patient. Lori Sims is a 60 y.o. female complains of L neck pain since 06/22/20. Reports that pain can radiate to shoulders. Denies a precipitating event or specific injury.  Localizes the pain to the posterior neck bilaterally.  Had a televisit, and was prescribed baclofen 3 times daily.  Reports that this is not helping.  Describes the pain as intermittent and achy in character.  Has tried OTC medications without relief.  Symptoms are made worse with activity.  Denies similar symptoms in the past.  Denies fever, chills, erythema, ecchymosis, effusion, weakness, numbness and tingling, saddle paresthesias, loss of bowel or bladder function.      ROS: As per HPI.  All other pertinent ROS negative.     Past Medical History:  Diagnosis Date  . GERD (gastroesophageal reflux disease)   . Hypercholesterolemia   . Hypertension    Past Surgical History:  Procedure Laterality Date  . CESAREAN SECTION    . COLONOSCOPY  2010   Dr. Laural Golden: normal except external hemorrhoids  . COLONOSCOPY N/A 09/12/2014   Procedure: COLONOSCOPY;  Surgeon: Danie Binder, MD; 2 tubular adenomas and 1 hyperplastic polyp, redundant left colon, external hemorrhoids.  Repeat in 2021 with overtube.  . COLONOSCOPY WITH PROPOFOL N/A 11/14/2019   Procedure: COLONOSCOPY WITH PROPOFOL;  Surgeon: Daneil Dolin, MD;  [facility]: [facility];  Service: Endoscopy;  Laterality: N/A;  8:30am  . ESOPHAGEAL DILATION N/A 09/12/2014   Procedure: ESOPHAGEAL DILATION;  Surgeon: Danie Binder, MD;  Location: [facility];  Service: Endoscopy;  Laterality: N/A;  . ESOPHAGOGASTRODUODENOSCOPY N/A 09/12/2014   Procedure: ESOPHAGOGASTRODUODENOSCOPY (EGD);  Surgeon: Danie Binder, MD; normal-appearing esophagus s/p empiric dilation, nonerosive gastritis s/p multiple biopsies, few small polyps in the gastric body s/p polypectomy,  normal-appearing duodenum.  Pathology benign.  . fibroid tumor right breast    . fibroid tumors left breast    . OVARIAN CYST DRAINAGE    . OVARIAN CYST REMOVAL     Allergies  Allergen Reactions  . Penicillins Itching and Nausea And Vomiting  . Tramadol Nausea And Vomiting  . Zoster Vaccine Recombinant, Adjuvanted     Swollen red arm   No current facility-administered medications on file prior to encounter.   Current Outpatient Medications on File Prior to Encounter  Medication Sig Dispense Refill  . aspirin EC 81 MG tablet Take 81 mg by mouth daily.    Marland Kitchen atorvastatin (LIPITOR) 20 MG tablet Take one tablet by mouth three times weekly (Patient taking differently: Take 20 mg by mouth daily. Take one tablet by mouth three times weekly) 36 tablet 3  . Calcium-Magnesium-Zinc (CAL-MAG-ZINC PO) Take 1 tablet by mouth daily.    . cholecalciferol (QC VITAMIN D3) 10 MCG (400 UNIT) TABS tablet Take 400 Units by mouth daily.    . Ferrous Gluconate (IRON 27 PO) Take 27 mg by mouth daily.    Marland Kitchen lisinopril (ZESTRIL) 20 MG tablet Take 1 tablet (20 mg total) by mouth daily. 90 tablet 3  . traZODone (DESYREL) 50 MG tablet Take 0.5-1 tablets (25-50 mg total) by mouth at bedtime as needed for sleep. 30 tablet 3  . traZODone (DESYREL) 50 MG tablet TAKE HALF TO 1 TABLET BY MOUTH AT BEDTIME AS NEEDED FOR SLEEP. 90 tablet 1  . zolpidem (AMBIEN) 10 MG tablet Take 1 tablet (10 mg total) by mouth at bedtime. 30 tablet 5  Social History   Socioeconomic History  . Marital status: Divorced    Spouse name: Not on file  . Number of children: Not on file  . Years of education: Not on file  . Highest education level: Not on file  Occupational History  . Occupation: [employer]  Tobacco Use  . Smoking status: Never Smoker  . Smokeless tobacco: Never Used  Vaping Use  . Vaping Use: Never used  Substance and Sexual Activity  . Alcohol use: Yes    Alcohol/week: 0.0 standard drinks    Comment: rare wine  . Drug use:  No  . Sexual activity: Not on file  Other Topics Concern  . Not on file  Social History Narrative  . Not on file   Social Determinants of Health   Financial Resource Strain: Not on file  Food Insecurity: Not on file  Transportation Needs: Not on file  Physical Activity: Not on file  Stress: Not on file  Social Connections: Not on file  Intimate Partner Violence: Not on file   Family History  Problem Relation Age of Onset  . Diabetes Mother   . Hypertension Father   . Colon cancer Neg Hx     OBJECTIVE:  Vitals:   07/05/20 1225 07/05/20 1226  BP: 125/79   Pulse: 70   Resp: 18   Temp: 98.2 F (36.8 C)   TempSrc: Oral   SpO2: 98%   Weight:  175 lb (79.4 kg)  Height:  5\' 6"  (1.676 m)    General appearance: ALERT; in no acute distress.  Head: NCAT Lungs: Normal respiratory effort CV: pulses 2+ bilaterally. Cap refill < 2 seconds Musculoskeletal:  Inspection: Skin warm, dry, clear and intact No erythema, effusion to neck Palpation: Posterior neck tender to palpation ROM: Limited ROM active and passive to neck with twisting Skin: warm and dry Neurologic: Ambulates without difficulty; Sensation intact about the upper/ lower extremities Psychological: alert and cooperative; normal mood and affect  DIAGNOSTIC STUDIES:  No results found.   ASSESSMENT & PLAN:  1. Cervical radiculopathy     Meds ordered this encounter  Medications  . predniSONE (STERAPRED UNI-PAK 21 TAB) 10 MG (21) TBPK tablet    Sig: Take by mouth daily for 6 days. Take 6 tablets on day 1, 5 tablets on day 2, 4 tablets on day 3, 3 tablets on day 4, 2 tablets on day 5, 1 tablet on day 6    Dispense:  21 tablet    Refill:  0    Order Specific Question:   Supervising Provider    Answer:   Chase Picket [id number]  . cyclobenzaprine (FLEXERIL) 5 MG tablet    Sig: Take 1 tablet (5 mg total) by mouth 3 (three) times daily as needed for muscle spasms.    Dispense:  30 tablet    Refill:  0     Order Specific Question:   Supervising Provider    Answer:   Chase Picket [id number]   Prescribe steroid taper Prescribe cyclobenzaprine Stop baclofen Continue conservative management of rest, ice, and gentle stretches Take ibuprofen as needed for pain relief (may cause abdominal discomfort, ulcers, and GI bleeds avoid taking with other NSAIDs) Take cyclobenzaprine at nighttime for symptomatic relief. Avoid driving or operating heavy machinery while using medication. Follow up with PCP if symptoms persist Return or go to the ER if you have any new or worsening symptoms (fever, chills, chest pain, abdominal pain, changes in bowel or bladder habits, pain  radiating into lower legs)  Reviewed expectations re: course of current medical issues. Questions answered. Outlined signs and symptoms indicating need for more acute intervention. Patient verbalized understanding. After Visit Summary given.       Faustino Congress, NP 07/06/20 1731

## 2020-07-14 ENCOUNTER — Encounter: Payer: Self-pay | Admitting: Family Medicine

## 2020-07-14 ENCOUNTER — Other Ambulatory Visit: Payer: Self-pay

## 2020-07-14 ENCOUNTER — Telehealth (INDEPENDENT_AMBULATORY_CARE_PROVIDER_SITE_OTHER): Payer: BC Managed Care – PPO | Admitting: Family Medicine

## 2020-07-14 VITALS — BP 129/78 | Ht 66.0 in | Wt 177.0 lb

## 2020-07-14 DIAGNOSIS — D649 Anemia, unspecified: Secondary | ICD-10-CM

## 2020-07-14 DIAGNOSIS — G4733 Obstructive sleep apnea (adult) (pediatric): Secondary | ICD-10-CM | POA: Diagnosis not present

## 2020-07-14 DIAGNOSIS — F5104 Psychophysiologic insomnia: Secondary | ICD-10-CM

## 2020-07-14 DIAGNOSIS — E663 Overweight: Secondary | ICD-10-CM

## 2020-07-14 DIAGNOSIS — E559 Vitamin D deficiency, unspecified: Secondary | ICD-10-CM

## 2020-07-14 DIAGNOSIS — I10 Essential (primary) hypertension: Secondary | ICD-10-CM | POA: Diagnosis not present

## 2020-07-14 DIAGNOSIS — R87619 Unspecified abnormal cytological findings in specimens from cervix uteri: Secondary | ICD-10-CM

## 2020-07-14 DIAGNOSIS — E782 Mixed hyperlipidemia: Secondary | ICD-10-CM | POA: Diagnosis not present

## 2020-07-14 DIAGNOSIS — R7301 Impaired fasting glucose: Secondary | ICD-10-CM

## 2020-07-14 NOTE — Assessment & Plan Note (Signed)
Controlled, no change in medication DASH diet and commitment to daily physical activity for a minimum of 30 minutes discussed and encouraged, as a part of hypertension management. The importance of attaining a healthy weight is also discussed.  BP/Weight 07/14/2020 07/05/2020 01/30/2020 11/14/2019 10/22/2019 08/29/2019 2/95/6213  Systolic BP 086 578 469 629 528 413 244  Diastolic BP 78 79 67 70 72 71 74  Wt. (Lbs) 177 175 175 176 176 177 176  BMI 28.57 28.25 28.25 28.41 28.41 28.57 28.41

## 2020-07-14 NOTE — Assessment & Plan Note (Signed)
Needs rept pap this year

## 2020-07-14 NOTE — Patient Instructions (Addendum)
F/U in 6 months, call if you need me sooner  Medications as discussed, ambien 5 days/ week  And trazodone half tablet every night, 90 day supplies of all meds will be sent  Please start using your CPAP machine  It is important that you exercise regularly at least 30 minutes 5 times a week. If you develop chest pain, have severe difficulty breathing, or feel very tired, stop exercising immediately and seek medical attention   Think about what you will eat, plan ahead. Choose " clean, green, fresh or frozen" over canned, processed or packaged foods which are more sugary, salty and fatty. 70 to 75% of food eaten should be vegetables and fruit. Three meals at set times with snacks allowed between meals, but they must be fruit or vegetables. Aim to eat over a 12 hour period , example 7 am to 7 pm, and STOP after  your last meal of the day. Drink water,generally about 64 ounces per day, no other drink is as healthy. Fruit juice is best enjoyed in a healthy way, by EATING the fruit.   Fasting CBC, lipid, cmp and EGFr,TSH, HBA1C, and vit D this week please  Thanks for choosing [facility], we consider it a privelige to serve you.

## 2020-07-14 NOTE — Progress Notes (Signed)
Virtual Visit via Telephone Note  I connected with Lori Sims on 07/14/20 at  8:00 AM EST by telephone and verified that I am speaking with the correct person using two identifiers.  Location: Patient: home Provider: work   I discussed the limitations, risks, security and privacy concerns of performing an evaluation and management service by telephone and the availability of in person appointments. I also discussed with the patient that there may be a patient responsible charge related to this service. The patient expressed understanding and agreed to proceed.   History of Present Illness: F/U chronic problems and address any new or current concerns. Review and update medications and allergies.Uses zolpidem on average 5 nights per week, and take half trazodone nightly, gets  Review recent lab and radiologic data . Update routine health maintainace. Review an encourage improved health habits to include nutrition, exercise and  sleep . Doing well overall Denies recent fever or chills. Denies sinus pressure, nasal congestion, ear pain or sore throat. Denies chest congestion, productive cough or wheezing. Denies chest pains, palpitations and leg swelling Denies abdominal pain, nausea, vomiting,diarrhea or constipation.   Denies dysuria, frequency, hesitancy or incontinence. Denies joint pain, swelling and limitation in mobility. Denies headaches, seizures, numbness, or tingling. Denies depression or  anxiety still c/o  insomnia. Denies skin break down or rash.         Observations/Objective: BP 129/78   Ht 5\' 6"  (1.676 m)   Wt 177 lb (80.3 kg)   LMP 08/26/2014   BMI 28.57 kg/m  Good communication with no confusion and intact memory. Alert and oriented x 3 No signs of respiratory distress during speech   Assessment and Plan:  Essential hypertension Controlled, no change in medication DASH diet and commitment to daily physical activity for a minimum of 30 minutes  discussed and encouraged, as a part of hypertension management. The importance of attaining a healthy weight is also discussed.  BP/Weight 07/14/2020 07/05/2020 01/30/2020 11/14/2019 10/22/2019 08/29/2019 7/32/2025  Systolic BP 427 062 376 283 151 761 607  Diastolic BP 78 79 67 70 72 71 74  Wt. (Lbs) 177 175 175 176 176 177 176  BMI 28.57 28.25 28.25 28.41 28.41 28.57 28.41       Abnormal Pap smear of cervix Needs rept pap this year  Insomnia Improved with current regime, however, has not been using trazodone nightly, encouraged to do this and to use ambien for 5/ 7 days per week Sleep hygiene reviewed and written information offered also. Prescription sent for  medication needed.   Hyperlipidemia Hyperlipidemia:Low fat diet discussed and encouraged.   Lipid Panel  Lab Results  Component Value Date   CHOL 150 01/31/2020   HDL 56 01/31/2020   LDLCALC 78 01/31/2020   TRIG 77 01/31/2020   CHOLHDL 2.7 01/31/2020  Controlled, no change in medication Updated lab needed.      Obstructive sleep apnea Importance of regular use discussed for cardiopulmonary protection   Follow Up Instructions:    I discussed the assessment and treatment plan with the patient. The patient was provided an opportunity to ask questions and all were answered. The patient agreed with the plan and demonstrated an understanding of the instructions.   The patient was advised to call back or seek an in-person evaluation if the symptoms worsen or if the condition fails to improve as anticipated.  I provided 22 minutes of non-face-to-face time during this encounter.   Tula Nakayama, MD

## 2020-07-18 NOTE — Assessment & Plan Note (Signed)
Hyperlipidemia:Low fat diet discussed and encouraged.   Lipid Panel  Lab Results  Component Value Date   CHOL 150 01/31/2020   HDL 56 01/31/2020   LDLCALC 78 01/31/2020   TRIG 77 01/31/2020   CHOLHDL 2.7 01/31/2020  Controlled, no change in medication Updated lab needed.

## 2020-07-18 NOTE — Assessment & Plan Note (Signed)
Importance of regular use discussed for cardiopulmonary protection

## 2020-07-18 NOTE — Assessment & Plan Note (Signed)
Improved with current regime, however, has not been using trazodone nightly, encouraged to do this and to use ambien for 5/ 7 days per week Sleep hygiene reviewed and written information offered also. Prescription sent for  medication needed.

## 2020-07-21 DIAGNOSIS — E663 Overweight: Secondary | ICD-10-CM | POA: Diagnosis not present

## 2020-07-21 DIAGNOSIS — E559 Vitamin D deficiency, unspecified: Secondary | ICD-10-CM | POA: Diagnosis not present

## 2020-07-21 DIAGNOSIS — D649 Anemia, unspecified: Secondary | ICD-10-CM | POA: Diagnosis not present

## 2020-07-21 DIAGNOSIS — E782 Mixed hyperlipidemia: Secondary | ICD-10-CM | POA: Diagnosis not present

## 2020-07-21 DIAGNOSIS — I10 Essential (primary) hypertension: Secondary | ICD-10-CM | POA: Diagnosis not present

## 2020-07-21 DIAGNOSIS — R7301 Impaired fasting glucose: Secondary | ICD-10-CM | POA: Diagnosis not present

## 2020-07-22 LAB — CMP14+EGFR
ALT: 11 IU/L (ref 0–32)
AST: 14 IU/L (ref 0–40)
Albumin/Globulin Ratio: 1.7 (ref 1.2–2.2)
Albumin: 4.4 g/dL (ref 3.8–4.9)
Alkaline Phosphatase: 73 IU/L (ref 44–121)
BUN/Creatinine Ratio: 15 (ref 12–28)
BUN: 17 mg/dL (ref 8–27)
Bilirubin Total: 0.3 mg/dL (ref 0.0–1.2)
CO2: 25 mmol/L (ref 20–29)
Calcium: 9.5 mg/dL (ref 8.7–10.3)
Chloride: 106 mmol/L (ref 96–106)
Creatinine, Ser: 1.11 mg/dL — ABNORMAL HIGH (ref 0.57–1.00)
GFR calc Af Amer: 62 mL/min/{1.73_m2} (ref >59)
GFR calc non Af Amer: 54 mL/min/{1.73_m2} — ABNORMAL LOW (ref >59)
Globulin, Total: 2.6 g/dL (ref 1.5–4.5)
Glucose: 78 mg/dL (ref 65–99)
Potassium: 4.4 mmol/L (ref 3.5–5.2)
Sodium: 144 mmol/L (ref 134–144)
Total Protein: 7 g/dL (ref 6.0–8.5)

## 2020-07-22 LAB — CBC WITH DIFFERENTIAL
Basophils Absolute: 0.1 10*3/uL (ref 0.0–0.2)
Basos: 1 %
EOS (ABSOLUTE): 0.1 10*3/uL (ref 0.0–0.4)
Eos: 1 %
Hematocrit: 35.2 % (ref 34.0–46.6)
Hemoglobin: 11.6 g/dL (ref 11.1–15.9)
Immature Grans (Abs): 0 10*3/uL (ref 0.0–0.1)
Immature Granulocytes: 0 %
Lymphocytes Absolute: 3 10*3/uL (ref 0.7–3.1)
Lymphs: 39 %
MCH: 30.4 pg (ref 26.6–33.0)
MCHC: 33 g/dL (ref 31.5–35.7)
MCV: 92 fL (ref 79–97)
Monocytes Absolute: 0.4 10*3/uL (ref 0.1–0.9)
Monocytes: 5 %
Neutrophils Absolute: 4 10*3/uL (ref 1.4–7.0)
Neutrophils: 54 %
RBC: 3.82 x10E6/uL (ref 3.77–5.28)
RDW: 12.7 % (ref 11.7–15.4)
WBC: 7.5 10*3/uL (ref 3.4–10.8)

## 2020-07-22 LAB — LIPID PANEL
Chol/HDL Ratio: 3.2 ratio (ref 0.0–4.4)
Cholesterol, Total: 211 mg/dL — ABNORMAL HIGH (ref 100–199)
HDL: 66 mg/dL (ref >39)
LDL Chol Calc (NIH): 132 mg/dL — ABNORMAL HIGH (ref 0–99)
Triglycerides: 71 mg/dL (ref 0–149)
VLDL Cholesterol Cal: 13 mg/dL (ref 5–40)

## 2020-07-22 LAB — HEMOGLOBIN A1C
Est. average glucose Bld gHb Est-mCnc: 114 mg/dL
Hgb A1c MFr Bld: 5.6 % (ref 4.8–5.6)

## 2020-07-22 LAB — VITAMIN D 25 HYDROXY (VIT D DEFICIENCY, FRACTURES): Vit D, 25-Hydroxy: 37 ng/mL (ref 30.0–100.0)

## 2020-08-15 ENCOUNTER — Other Ambulatory Visit: Payer: Self-pay | Admitting: Family Medicine

## 2020-08-17 ENCOUNTER — Other Ambulatory Visit: Payer: Self-pay | Admitting: Family Medicine

## 2020-08-19 ENCOUNTER — Telehealth: Payer: Self-pay

## 2020-08-19 ENCOUNTER — Other Ambulatory Visit: Payer: Self-pay | Admitting: Family Medicine

## 2020-08-19 MED ORDER — ZOLPIDEM TARTRATE 10 MG PO TABS: 10.0000 mg | ORAL_TABLET | Freq: Every day | ORAL | 5 refills | Status: DC

## 2020-08-19 NOTE — Telephone Encounter (Signed)
Pt requests ambien to be sent to pharmacy

## 2020-08-19 NOTE — Telephone Encounter (Signed)
Refills sent

## 2020-08-20 DIAGNOSIS — H903 Sensorineural hearing loss, bilateral: Secondary | ICD-10-CM | POA: Diagnosis not present

## 2020-08-20 DIAGNOSIS — H9313 Tinnitus, bilateral: Secondary | ICD-10-CM | POA: Diagnosis not present

## 2020-08-20 DIAGNOSIS — M542 Cervicalgia: Secondary | ICD-10-CM | POA: Diagnosis not present

## 2020-09-30 ENCOUNTER — Other Ambulatory Visit: Payer: Self-pay | Admitting: Cardiology

## 2020-09-30 ENCOUNTER — Ambulatory Visit (INDEPENDENT_AMBULATORY_CARE_PROVIDER_SITE_OTHER): Payer: BC Managed Care – PPO

## 2020-09-30 ENCOUNTER — Other Ambulatory Visit: Payer: Self-pay

## 2020-09-30 DIAGNOSIS — R0989 Other specified symptoms and signs involving the circulatory and respiratory systems: Secondary | ICD-10-CM

## 2020-09-30 DIAGNOSIS — I6523 Occlusion and stenosis of bilateral carotid arteries: Secondary | ICD-10-CM

## 2020-10-13 ENCOUNTER — Ambulatory Visit: Payer: BC Managed Care – PPO | Admitting: Orthopedic Surgery

## 2020-10-13 ENCOUNTER — Telehealth: Payer: Self-pay | Admitting: *Deleted

## 2020-10-13 NOTE — Telephone Encounter (Signed)
LM to return call.

## 2020-10-13 NOTE — Telephone Encounter (Signed)
Pt voiced understanding

## 2020-10-13 NOTE — Telephone Encounter (Signed)
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

## 2020-10-17 IMAGING — MG DIGITAL SCREENING BILAT W/ TOMO W/ CAD
8 series · 8 of 24 positions shown · non-contrast
Comparison: Previous exam(s).

CLINICAL DATA: Screening.

EXAM:
DIGITAL SCREENING BILATERAL MAMMOGRAM WITH TOMO AND CAD

[L CC synth-2D]
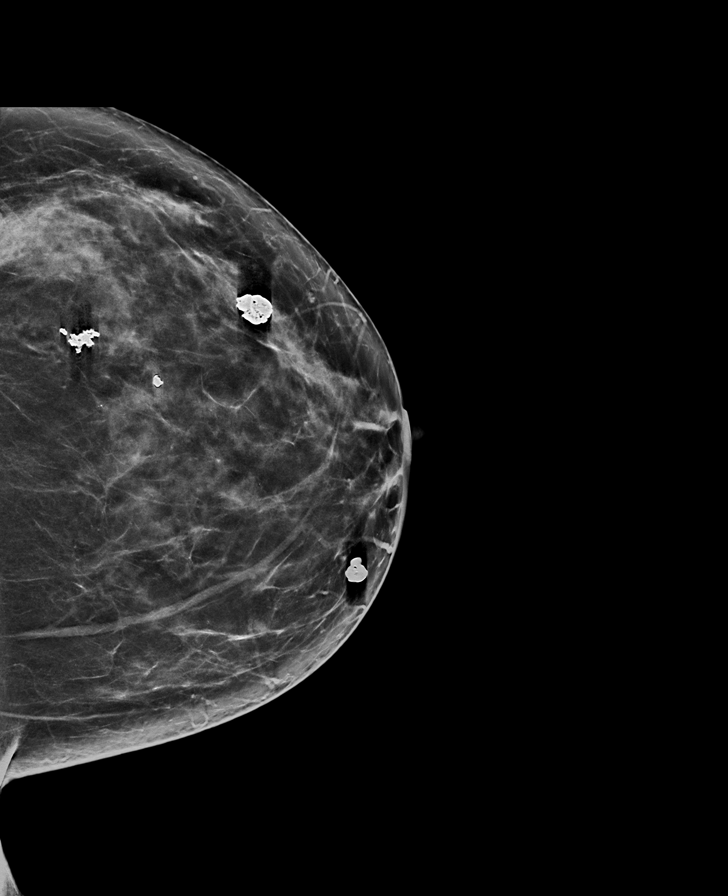

[R CC synth-2D]
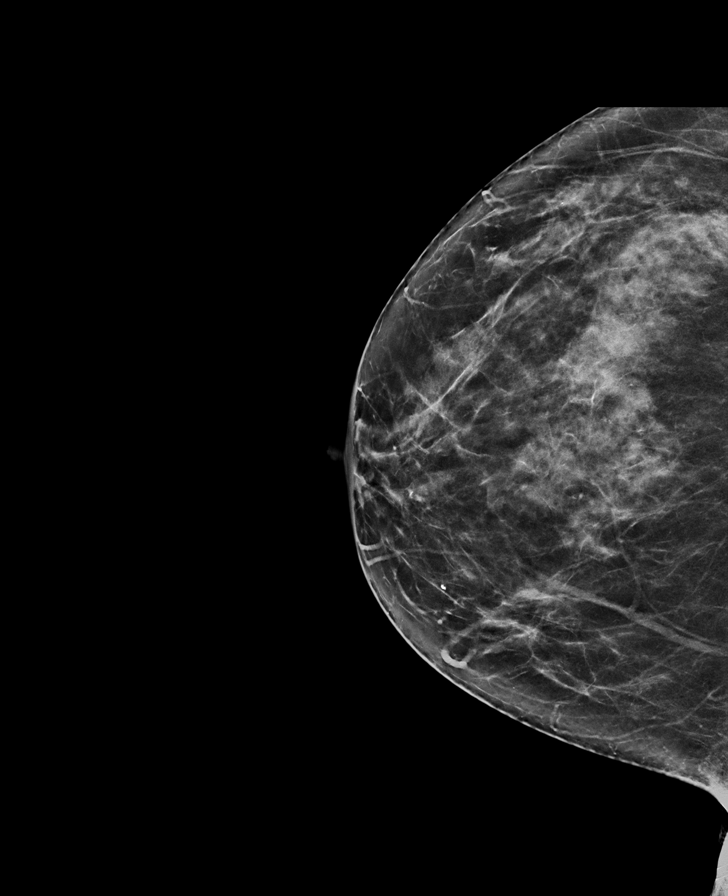

[R MLO synth-2D]
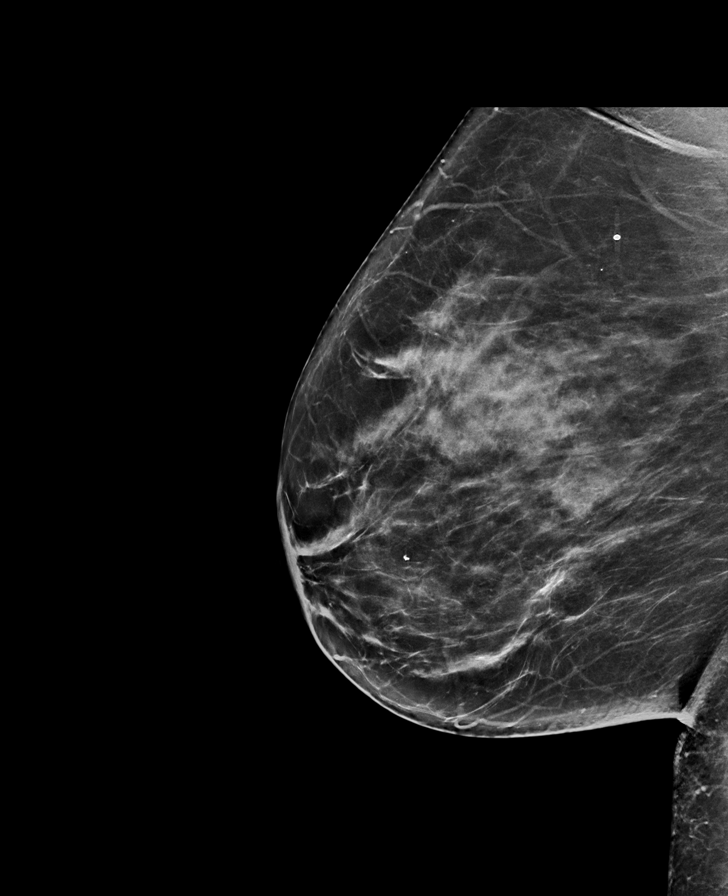

[L MLO synth-2D]
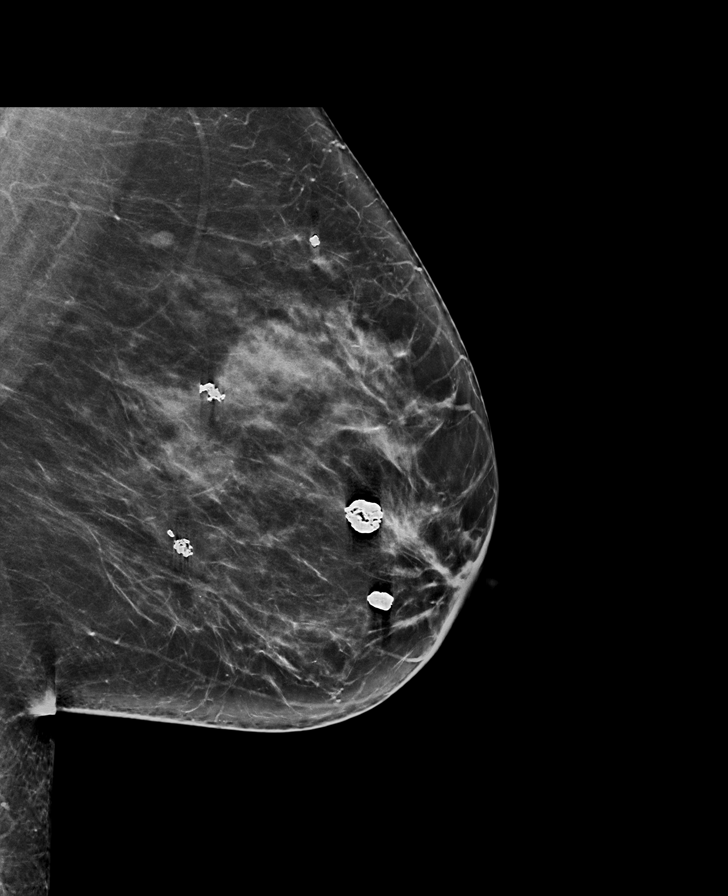

[R MLO tomo · tomo slice 39/77.0]
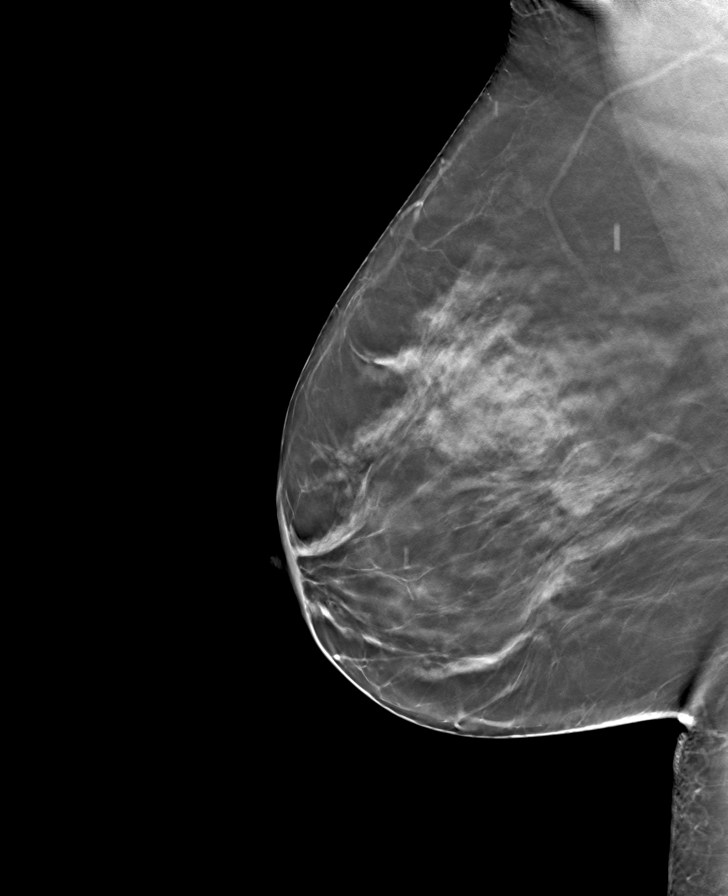

[R CC tomo · tomo slice 35/70.0]
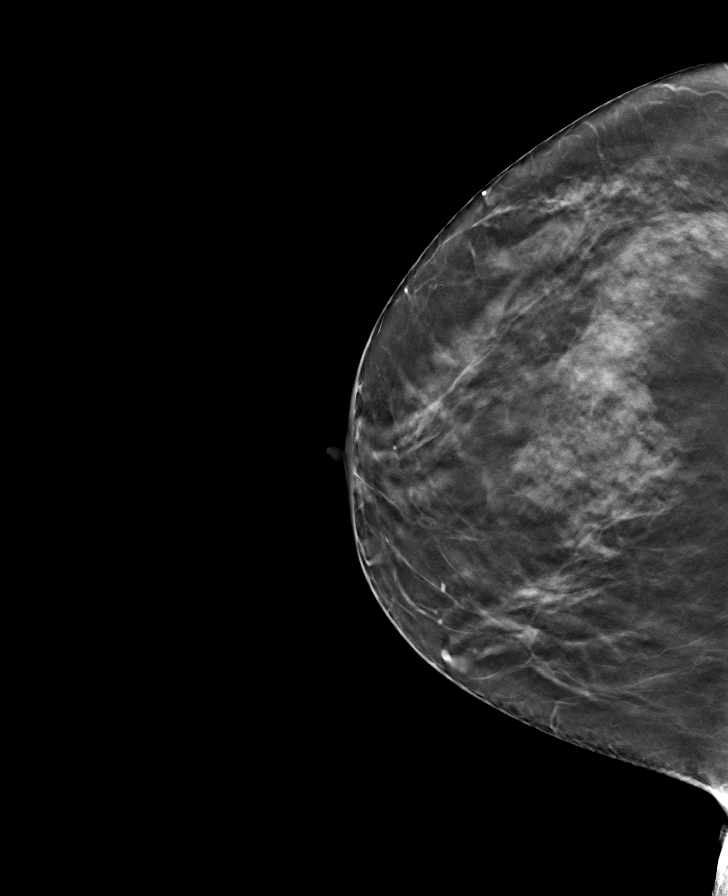

[L MLO tomo · tomo slice 41/81.0]
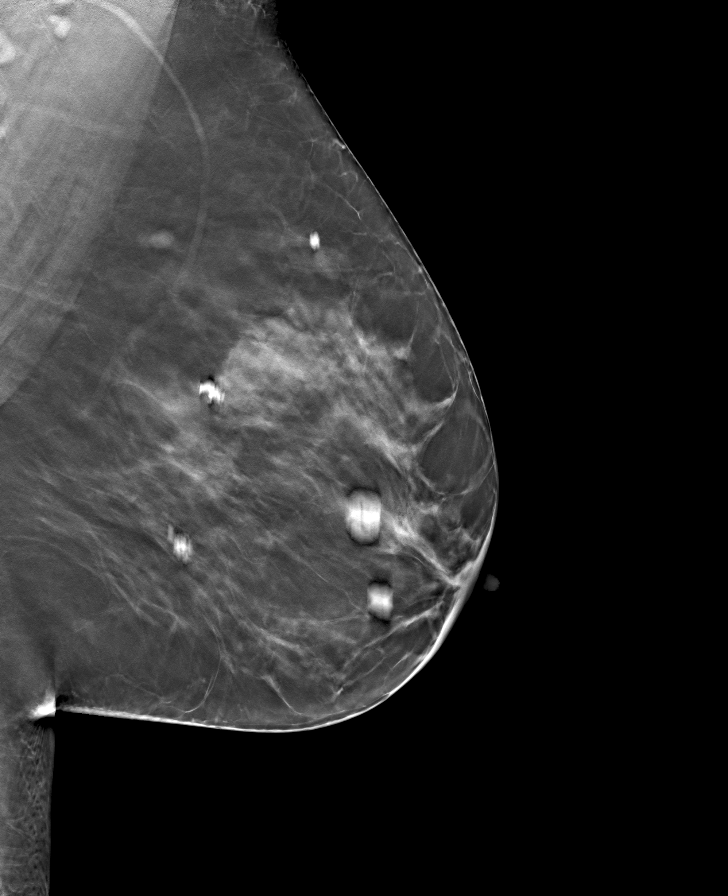

[L CC tomo · tomo slice 38/75.0]
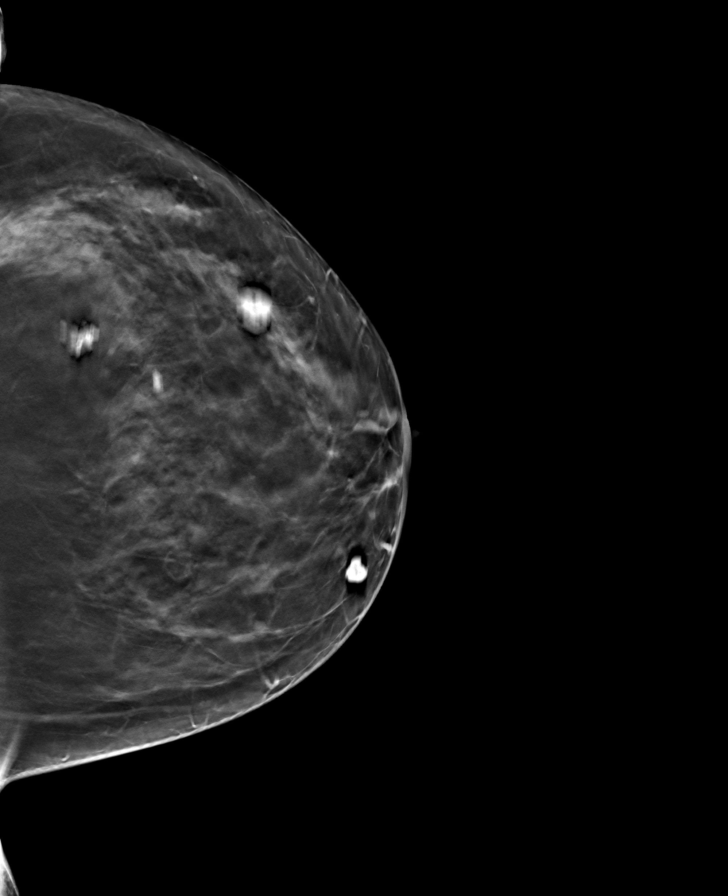

[8 of 24 positions shown; findings below may reference images not displayed]

ACR Breast Density Category c: The breast tissue is heterogeneously
dense, which may obscure small masses.
FINDINGS: There are no findings suspicious for malignancy. Images were
processed with CAD.
IMPRESSION: No mammographic evidence of malignancy. A result letter of this
screening mammogram will be mailed directly to the patient.

RECOMMENDATION:
Screening mammogram in one year. (Code:FT-U-LHB)

BI-RADS CATEGORY  1: Negative.

## 2020-10-20 ENCOUNTER — Other Ambulatory Visit: Payer: Self-pay

## 2020-10-20 ENCOUNTER — Encounter: Payer: Self-pay | Admitting: Orthopedic Surgery

## 2020-10-20 ENCOUNTER — Ambulatory Visit (INDEPENDENT_AMBULATORY_CARE_PROVIDER_SITE_OTHER): Payer: BC Managed Care – PPO | Admitting: Orthopedic Surgery

## 2020-10-20 ENCOUNTER — Ambulatory Visit: Payer: BC Managed Care – PPO

## 2020-10-20 VITALS — BP 128/70 | HR 61 | Ht 66.0 in | Wt 171.0 lb

## 2020-10-20 DIAGNOSIS — M542 Cervicalgia: Secondary | ICD-10-CM

## 2020-10-20 DIAGNOSIS — S161XXA Strain of muscle, fascia and tendon at neck level, initial encounter: Secondary | ICD-10-CM

## 2020-10-20 MED ORDER — PREDNISONE 10 MG (21) PO TBPK: ORAL_TABLET | ORAL | 0 refills | Status: DC

## 2020-10-20 NOTE — Progress Notes (Signed)
New Patient Visit  Assessment: Lori Sims is a 60 y.o. female with the following: Neck pain, with anterolateral cervical muscle strain; no radicular symptoms.   Plan: Patient has had right-sided neck pain since January, 2022.  No specific injury.  On physical exam, she has restricted range of motion due to the pain.  However, she has intact sensation, and excellent strength throughout.  Radiographs are without extensive degenerative changes.  At this point, I have low concern for compression on a spinal nerve or the spinal cord.  She is taking appropriate medications, but notes NSAIDs have been hard on her stomach.  She is interested in a repeat prednisone Dosepak, which was prescribed today.  In addition, I have recommended physical therapy to improve the range of motion and stiffness she has in the right side of her neck.  I am a little bit concerned about the pain that she has been experiencing in her throat over the last 1-2 weeks.  I will send a message directly to Dr. Nevada Crane, so that he can continue to monitor it as I am not convinced that this is related to the right-sided neck pain.  All questions were answered and she is amenable to this plan.  Follow-up as needed.   Follow-up: Return if symptoms worsen or fail to improve.  Subjective:  Chief Complaint  Patient presents with  . Neck Pain    Neck pain for 5 months. 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 Has had muscle relaxer and steroids in the past but doesn't feel like they helped much.     History of Present Illness: Lori Sims is a 60 y.o. female who has been referred by Allyn Kenner, MD for evaluation of neck pain.  She states she has been having pain in the right side of her neck since January, 2022.  She presented to an urgent care at that time, and was given Flexeril and a prednisone taper.  She feels that prednisone improved her pain a little bit, but this was not sustained.   Occasionally, the pain will radiate into the trapezius muscle and shoulder area, but nothing radiates into her hand.  No numbness or tingling.  She does not feel as though her gait or balance has changed.  She has been taking Tylenol and NSAIDs since, which does improve her symptoms, but upsets her stomach.  No specific injury, although she did note that she slept in an awkward position, and woke up with pain in her neck.  She has not worked with physical therapy.   Review of Systems: No fevers or chills No numbness or tingling No chest pain No shortness of breath No bowel or bladder dysfunction No GI distress No headaches + throat pain    Medical History:  Past Medical History:  Diagnosis Date  . GERD (gastroesophageal reflux disease)   . Hypercholesterolemia   . Hypertension     Past Surgical History:  Procedure Laterality Date  . CESAREAN SECTION    . COLONOSCOPY  2010   Dr. Laural Golden: normal except external hemorrhoids  . COLONOSCOPY N/A 09/12/2014   Procedure: COLONOSCOPY;  Surgeon: Danie Binder, MD; 2 tubular adenomas and 1 hyperplastic polyp, redundant left colon, external hemorrhoids.  Repeat in 2021 with overtube.  . COLONOSCOPY WITH PROPOFOL N/A 11/14/2019   Procedure: COLONOSCOPY WITH PROPOFOL;  Surgeon: Daneil Dolin, MD;  [facility];  Service: Endoscopy;  Laterality: N/A;  8:30am  .  ESOPHAGEAL DILATION N/A 09/12/2014   Procedure: ESOPHAGEAL DILATION;  Surgeon: Danie Binder, MD;  [facility];  Service: Endoscopy;  Laterality: N/A;  . ESOPHAGOGASTRODUODENOSCOPY N/A 09/12/2014   Procedure: ESOPHAGOGASTRODUODENOSCOPY (EGD);  Surgeon: Danie Binder, MD; normal-appearing esophagus s/p empiric dilation, nonerosive gastritis s/p multiple biopsies, few small polyps in the gastric body s/p polypectomy, normal-appearing duodenum.  Pathology benign.  . fibroid tumor right breast    . fibroid tumors left breast    . OVARIAN CYST DRAINAGE    . OVARIAN CYST  REMOVAL      Family History  Problem Relation Age of Onset  . Diabetes Mother   . Hypertension Father   . Colon cancer Neg Hx    Social History   Tobacco Use  . Smoking status: Never Smoker  . Smokeless tobacco: Never Used  Vaping Use  . Vaping Use: Never used  Substance Use Topics  . Alcohol use: Yes    Alcohol/week: 0.0 standard drinks    Comment: rare wine  . Drug use: No    Allergies  Allergen Reactions  . Penicillins Itching and Nausea And Vomiting  . Tramadol Nausea And Vomiting  . Zoster Vaccine Recombinant, Adjuvanted     Swollen red arm    Current Meds  Medication Sig  . aspirin EC 81 MG tablet Take 81 mg by mouth daily.  Marland Kitchen atorvastatin (LIPITOR) 20 MG tablet TAKE ONE TABLET BY MOUTH THREE TIMES WEEKLY  . Calcium-Magnesium-Zinc (CAL-MAG-ZINC PO) Take 1 tablet by mouth daily.  . cholecalciferol (VITAMIN D3) 10 MCG (400 UNIT) TABS tablet Take 400 Units by mouth daily.  . Ferrous Gluconate (IRON 27 PO) Take 27 mg by mouth daily.  Marland Kitchen lisinopril (ZESTRIL) 20 MG tablet TAKE 1 TABLET BY MOUTH EVERY DAY  . predniSONE (STERAPRED UNI-PAK 21 TAB) 10 MG (21) TBPK tablet 10 mg DS 12 as directed  . zolpidem (AMBIEN) 10 MG tablet Take 1 tablet (10 mg total) by mouth at bedtime.    Objective: BP 128/70   Pulse 61   Ht 5\' 6"  (1.676 m)   Wt 171 lb (77.6 kg)   LMP 08/26/2014   BMI 27.60 kg/m   Physical Exam:  General: Alert and oriented.  No acute distress.  Flat affect. Gait: Normal  Evaluation of the neck and cervical spine demonstrates no atrophy or deformity.  No tenderness to palpation along the midline.  She has restricted rotation to her right shoulder, as well as as well as rotation to her left shoulder.  Once she reaches maximal rotation, she states she does not have any pain.  Full flexion and extension of the neck.  Strength testing of the deltoids, biceps, triceps, wrist extension and flexion, as well as grip strength is 5/5 bilaterally.  She has intact  sensation in all dermatomal distributions.  2+ biceps reflexes bilaterally.  2+ patellar reflexes bilaterally.  5/5 strength in the quadriceps, hamstrings, ankle dorsiflexors and EHL bilaterally.    IMAGING: I personally ordered and reviewed the following images  X-rays of the cervical spine were obtained in clinic today and demonstrates intact cervical lordosis with well-maintained disc heights.  Minimal degenerative changes are noted, most prominent at C5-6 with small osteophytes and mild loss of disc height.  There is no anterolisthesis.  Impression: Mild cervical spondyloarthritis.  New Medications:  Meds ordered this encounter  Medications  . predniSONE (STERAPRED UNI-PAK 21 TAB) 10 MG (21) TBPK tablet    Sig: 10 mg DS 12 as directed  Dispense:  48 tablet    Refill:  0      Mordecai Rasmussen, MD  10/20/2020 9:20 AM

## 2020-10-21 ENCOUNTER — Ambulatory Visit (INDEPENDENT_AMBULATORY_CARE_PROVIDER_SITE_OTHER): Payer: BC Managed Care – PPO | Admitting: Cardiology

## 2020-10-21 ENCOUNTER — Encounter: Payer: Self-pay | Admitting: Cardiology

## 2020-10-21 VITALS — BP 154/78 | HR 62 | Ht 66.0 in | Wt 172.0 lb

## 2020-10-21 DIAGNOSIS — I10 Essential (primary) hypertension: Secondary | ICD-10-CM | POA: Diagnosis not present

## 2020-10-21 DIAGNOSIS — I6523 Occlusion and stenosis of bilateral carotid arteries: Secondary | ICD-10-CM

## 2020-10-21 MED ORDER — LISINOPRIL 40 MG PO TABS: 40.0000 mg | ORAL_TABLET | Freq: Every day | ORAL | 3 refills | Status: DC

## 2020-10-21 NOTE — Patient Instructions (Signed)
Medication Instructions:  Your physician has recommended you make the following change in your medication:  INCREASE Lisinopril to 40 mg daily  *If you need a refill on your cardiac medications before your next appointment, please call your pharmacy*   Lab Work: None If you have labs (blood work) drawn today and your tests are completely normal, you will receive your results only by: Marland Kitchen MyChart Message (if you have MyChart) OR . A paper copy in the mail If you have any lab test that is abnormal or we need to change your treatment, we will call you to review the results.   Testing/Procedures: None   Follow-Up: At [facility], you and your health needs are our priority.  As part of our continuing mission to provide you with exceptional heart care, we have created designated Provider Care Teams.  These Care Teams include your primary Cardiologist (physician) and Advanced Practice Providers (APPs -  Physician Assistants and Nurse Practitioners) who all work together to provide you with the care you need, when you need it.  We recommend signing up for the patient portal called "MyChart".  Sign up information is provided on this After Visit Summary.  MyChart is used to connect with patients for Virtual Visits (Telemedicine).  Patients are able to view lab/test results, encounter notes, upcoming appointments, etc.  Non-urgent messages can be sent to your provider as well.   To learn more about what you can do with MyChart, go to [email or web address].    Your next appointment:   12 month(s)  The format for your next appointment:   In Person  Provider:   Carlyle Dolly, MD   Other Instructions Please keep a BP log for TWO (2) weeks and update our office at the end of those two weeks.

## 2020-10-21 NOTE — Progress Notes (Signed)
Clinical Summary Lori Sims is a 60 y.o.female seen today for follow up of the following medical problems.   1. HTN - notes indicate she has felt her bp meds have contributed to insomnia and hair loss.  - has not been able to tolerate clonidine or amlodopine in the past.Norvasc caused leg swelling, chest pain.She does not recall the side effects to clonidien. - currently on maxide only. - has had some chronic issues with hypokalemia, on chronic replacement.  - recent issues with low bp's - stopped aldactone, lisinopril was lowered to 20mg  daily   - home bp's usually 150s/80s - just started prednisone for neck pains   2. Carotid stenosis - 08/2020 carotid US: RICA 9-37%, LICA 16-96%    Past Medical History:  Diagnosis Date  . GERD (gastroesophageal reflux disease)   . Hypercholesterolemia   . Hypertension      Allergies  Allergen Reactions  . Penicillins Itching and Nausea And Vomiting  . Tramadol Nausea And Vomiting  . Zoster Vaccine Recombinant, Adjuvanted     Swollen red arm     Current Outpatient Medications  Medication Sig Dispense Refill  . aspirin EC 81 MG tablet Take 81 mg by mouth daily.    Marland Kitchen atorvastatin (LIPITOR) 20 MG tablet TAKE ONE TABLET BY MOUTH THREE TIMES WEEKLY 36 tablet 3  . Calcium-Magnesium-Zinc (CAL-MAG-ZINC PO) Take 1 tablet by mouth daily.    . cholecalciferol (VITAMIN D3) 10 MCG (400 UNIT) TABS tablet Take 400 Units by mouth daily.    . Ferrous Gluconate (IRON 27 PO) Take 27 mg by mouth daily.    Marland Kitchen lisinopril (ZESTRIL) 20 MG tablet TAKE 1 TABLET BY MOUTH EVERY DAY 90 tablet 3  . predniSONE (STERAPRED UNI-PAK 21 TAB) 10 MG (21) TBPK tablet 10 mg DS 12 as directed 48 tablet 0  . zolpidem (AMBIEN) 10 MG tablet Take 1 tablet (10 mg total) by mouth at bedtime. 30 tablet 5   No current facility-administered medications for this visit.     Past Surgical History:  Procedure Laterality Date  . CESAREAN SECTION    . COLONOSCOPY   2010   Dr. Laural Golden: normal except external hemorrhoids  . COLONOSCOPY N/A 09/12/2014   Procedure: COLONOSCOPY;  Surgeon: Danie Binder, MD; 2 tubular adenomas and 1 hyperplastic polyp, redundant left colon, external hemorrhoids.  Repeat in 2021 with overtube.  . COLONOSCOPY WITH PROPOFOL N/A 11/14/2019   Procedure: COLONOSCOPY WITH PROPOFOL;  Surgeon: Daneil Dolin, MD;  [facility];  Service: Endoscopy;  Laterality: N/A;  8:30am  . ESOPHAGEAL DILATION N/A 09/12/2014   Procedure: ESOPHAGEAL DILATION;  Surgeon: Danie Binder, MD;  [facility];  Service: Endoscopy;  Laterality: N/A;  . ESOPHAGOGASTRODUODENOSCOPY N/A 09/12/2014   Procedure: ESOPHAGOGASTRODUODENOSCOPY (EGD);  Surgeon: Danie Binder, MD; normal-appearing esophagus s/p empiric dilation, nonerosive gastritis s/p multiple biopsies, few small polyps in the gastric body s/p polypectomy, normal-appearing duodenum.  Pathology benign.  . fibroid tumor right breast    . fibroid tumors left breast    . OVARIAN CYST DRAINAGE    . OVARIAN CYST REMOVAL       Allergies  Allergen Reactions  . Penicillins Itching and Nausea And Vomiting  . Tramadol Nausea And Vomiting  . Zoster Vaccine Recombinant, Adjuvanted     Swollen red arm      Family History  Problem Relation Age of Onset  . Diabetes Mother   . Hypertension Father   . Colon  cancer Neg Hx      Social History Lori Sims reports that she has never smoked. She has never used smokeless tobacco. Lori Sims reports current alcohol use.   Review of Systems CONSTITUTIONAL: No weight loss, fever, chills, weakness or fatigue.  HEENT: Eyes: No visual loss, blurred vision, double vision or yellow sclerae.No hearing loss, sneezing, congestion, runny nose or sore throat.  SKIN: No rash or itching.  CARDIOVASCULAR: per hpi RESPIRATORY: No shortness of breath, cough or sputum.  GASTROINTESTINAL: No anorexia, nausea, vomiting or diarrhea. No abdominal pain or blood.   GENITOURINARY: No burning on urination, no polyuria NEUROLOGICAL: No headache, dizziness, syncope, paralysis, ataxia, numbness or tingling in the extremities. No change in bowel or bladder control.  MUSCULOSKELETAL: No muscle, back pain, joint pain or stiffness.  LYMPHATICS: No enlarged nodes. No history of splenectomy.  PSYCHIATRIC: No history of depression or anxiety.  ENDOCRINOLOGIC: No reports of sweating, cold or heat intolerance. No polyuria or polydipsia.  Marland Kitchen   Physical Examination Today's Vitals   10/21/20 1112  BP: (!) 154/78  Pulse: 62  SpO2: 99%  Weight: 172 lb (78 kg)  Height: 5\' 6"  (1.676 m)   Body mass index is 27.76 kg/m.  Gen: resting comfortably, no acute distress HEENT: no scleral icterus, pupils equal round and reactive, no palptable cervical adenopathy,  CV: RRR, 2/6 systolic murmur apex, no jvd Resp: Clear to auscultation bilaterally GI: abdomen is soft, non-tender, non-distended, normal bowel sounds, no hepatosplenomegaly MSK: extremities are warm, no edema.  Skin: warm, no rash Neuro:  no focal deficits Psych: appropriate affect   Diagnostic Studies  08/2019 echo IMPRESSIONS    1. Left ventricular ejection fraction, by estimation, is 60 to 65%. The  left ventricle has normal function. The left ventricle has no regional  wall motion abnormalities. Left ventricular diastolic parameters were  normal.  2. Right ventricular systolic function is normal. The right ventricular  size is normal.  3. The mitral valve is normal in structure. No evidence of mitral valve  regurgitation. No evidence of mitral stenosis.  4. The aortic valve is tricuspid. Aortic valve regurgitation is not  visualized. No aortic stenosis is present.  5. The inferior vena cava is normal in size with greater than 50%  respiratory variability, suggesting right atrial pressure of 3 mmHg.    09/2019 carotid US Summary:  Right Carotid: Velocities in the right ICA are  consistent with a 1-39%  stenosis.   Left Carotid: Velocities in the left ICA are consistent with a 40-59%  stenosis.    09/2020 carotid US RICA 6-01%, LICA 09-32%  Assessment and Plan  1. HTN - bp above goal, incrase lisinopril to 20m daily - she will update [place] with bps in 2 weeks  2. Carotid stenosis - continue ASA, statin - mild to moderate disease, repeat US next year  EKG shows SR, no ischemic changes  F/u 1 year  Arnoldo Lenis, M.D.

## 2020-10-27 ENCOUNTER — Ambulatory Visit (HOSPITAL_COMMUNITY): Payer: BC Managed Care – PPO | Admitting: Physical Therapy

## 2020-10-27 DIAGNOSIS — I10 Essential (primary) hypertension: Secondary | ICD-10-CM | POA: Diagnosis not present

## 2020-10-31 DIAGNOSIS — Z0001 Encounter for general adult medical examination with abnormal findings: Secondary | ICD-10-CM | POA: Diagnosis not present

## 2021-01-12 ENCOUNTER — Ambulatory Visit: Payer: BC Managed Care – PPO | Admitting: Family Medicine

## 2021-02-08 DIAGNOSIS — H2512 Age-related nuclear cataract, left eye: Secondary | ICD-10-CM | POA: Diagnosis not present

## 2021-02-08 DIAGNOSIS — H40013 Open angle with borderline findings, low risk, bilateral: Secondary | ICD-10-CM | POA: Diagnosis not present

## 2021-02-08 DIAGNOSIS — H25043 Posterior subcapsular polar age-related cataract, bilateral: Secondary | ICD-10-CM | POA: Diagnosis not present

## 2021-02-08 DIAGNOSIS — H25013 Cortical age-related cataract, bilateral: Secondary | ICD-10-CM | POA: Diagnosis not present

## 2021-02-08 DIAGNOSIS — H2513 Age-related nuclear cataract, bilateral: Secondary | ICD-10-CM | POA: Diagnosis not present

## 2021-03-04 DIAGNOSIS — H2512 Age-related nuclear cataract, left eye: Secondary | ICD-10-CM | POA: Diagnosis not present

## 2021-03-12 DIAGNOSIS — H2513 Age-related nuclear cataract, bilateral: Secondary | ICD-10-CM | POA: Diagnosis not present

## 2021-03-14 ENCOUNTER — Encounter: Payer: Self-pay | Admitting: Emergency Medicine

## 2021-03-14 ENCOUNTER — Ambulatory Visit
Admission: EM | Admit: 2021-03-14 | Discharge: 2021-03-14 | Disposition: A | Discharge status: Home / Self Care | Payer: BC Managed Care – PPO | Attending: Internal Medicine | Admitting: Internal Medicine

## 2021-03-14 ENCOUNTER — Other Ambulatory Visit: Payer: Self-pay

## 2021-03-14 DIAGNOSIS — R300 Dysuria: Secondary | ICD-10-CM | POA: Diagnosis not present

## 2021-03-14 DIAGNOSIS — N3001 Acute cystitis with hematuria: Secondary | ICD-10-CM | POA: Diagnosis not present

## 2021-03-14 DIAGNOSIS — R319 Hematuria, unspecified: Secondary | ICD-10-CM | POA: Diagnosis not present

## 2021-03-14 LAB — POCT URINALYSIS DIP (MANUAL ENTRY)
Bilirubin, UA: NEGATIVE
Glucose, UA: NEGATIVE mg/dL
Ketones, POC UA: NEGATIVE mg/dL
Nitrite, UA: NEGATIVE
Protein Ur, POC: 100 mg/dL — AB
Spec Grav, UA: >=1.03 — AB (ref 1.010–1.025)
Urobilinogen, UA: 0.2 E.U./dL
pH, UA: 5.5 (ref 5.0–8.0)

## 2021-03-14 MED ORDER — SULFAMETHOXAZOLE-TRIMETHOPRIM 800-160 MG PO TABS: 1.0000 | ORAL_TABLET | Freq: Two times a day (BID) | ORAL | 0 refills | Status: AC

## 2021-03-14 NOTE — ED Triage Notes (Signed)
Pt states that she has been having urinary frequency since yesterday this morning she seen blood.  No fevers to note.

## 2021-03-14 NOTE — Discharge Instructions (Addendum)
Increase oral fluid intake Take antibiotics as prescribed We will call you with recommendations if labs are abnormal

## 2021-03-14 NOTE — ED Provider Notes (Signed)
[facility]    CSN: [id number] Arrival date & time: 03/14/21  0801      History   Chief Complaint Chief Complaint  Patient presents with   Urinary Tract Infection    HPI Lori Sims is a 60 y.o. female comes to the urgent care with 1 day history of urgency, frequency and blood in the urine.  Symptoms started yesterday and has been persistent.  Patient denies dysuria.  She has some discomfort with urination.  She endorses frequency and urgency.  No vaginal discharge.  No flank pain, nausea or vomiting.Marland Kitchen   HPI  Past Medical History:  Diagnosis Date   GERD (gastroesophageal reflux disease)    Hypercholesterolemia    Hypertension     Patient Active Problem List   Diagnosis Date Noted   Hematuria 02/02/2020   Abnormal Pap smear of cervix 01/30/2020   History of adenomatous polyp of colon 08/19/2019   Tubular adenoma of colon 07/15/2019   Obstructive sleep apnea 11/05/2015   Alopecia 06/10/2015   Insomnia 06/10/2015   Dysphagia, pharyngoesophageal phase 08/27/2014   Anemia 08/27/2014   Hot flashes, menopausal 07/17/2014   Overweight (BMI 25.0-29.9) 79/39/0300   Metabolic syndrome X 92/33/0076   Vitamin D deficiency 12/02/2010   Hyperlipidemia 02/05/2010   KNEE PAIN, RIGHT 01/27/2010   B12 DEFICIENCY 07/10/2008   Essential hypertension 07/06/2008   GOITER, UNSPECIFIED 07/02/2008   ENDOMETRIOSIS 07/02/2008   IRON DEFIC ANEMIA [place] DIET IRON INTAKE 04/28/2008   Allergic rhinitis 04/28/2008    Past Surgical History:  Procedure Laterality Date   CESAREAN SECTION     COLONOSCOPY  2010   Dr. Laural Golden: normal except external hemorrhoids   COLONOSCOPY N/A 09/12/2014   Procedure: COLONOSCOPY;  Surgeon: Danie Binder, MD; 2 tubular adenomas and 1 hyperplastic polyp, redundant left colon, external hemorrhoids.  Repeat in 2021 with overtube.   COLONOSCOPY WITH PROPOFOL N/A 11/14/2019   Procedure: COLONOSCOPY WITH PROPOFOL;  Surgeon: Daneil Dolin, MD;  [facility];  Service: Endoscopy;  Laterality: N/A;  8:30am   ESOPHAGEAL DILATION N/A 09/12/2014   Procedure: ESOPHAGEAL DILATION;  Surgeon: Danie Binder, MD;  [facility];  Service: Endoscopy;  Laterality: N/A;   ESOPHAGOGASTRODUODENOSCOPY N/A 09/12/2014   Procedure: ESOPHAGOGASTRODUODENOSCOPY (EGD);  Surgeon: Danie Binder, MD; normal-appearing esophagus s/p empiric dilation, nonerosive gastritis s/p multiple biopsies, few small polyps in the gastric body s/p polypectomy, normal-appearing duodenum.  Pathology benign.   fibroid tumor right breast     fibroid tumors left breast     OVARIAN CYST DRAINAGE     OVARIAN CYST REMOVAL      OB History   No obstetric history on file.      Home Medications    Prior to Admission medications   Medication Sig Start Date End Date Taking? Authorizing Provider  sulfamethoxazole-trimethoprim (BACTRIM DS) 800-160 MG tablet Take 1 tablet by mouth 2 (two) times daily for 3 days. 03/14/21 03/17/21 Yes Aden Youngman, Myrene Galas, MD  aspirin EC 81 MG tablet Take 81 mg by mouth daily.    [provider]  atorvastatin (LIPITOR) 20 MG tablet TAKE ONE TABLET BY MOUTH THREE TIMES WEEKLY 08/17/20   Perlie Mayo, NP  Calcium-Magnesium-Zinc (CAL-MAG-ZINC PO) Take 1 tablet by mouth daily.    [provider]  cholecalciferol (VITAMIN D3) 10 MCG (400 UNIT) TABS tablet Take 400 Units by mouth daily.    [provider]  Ferrous Gluconate (IRON 27 PO) Take 27 mg by  mouth daily.    [provider]  lisinopril (ZESTRIL) 40 MG tablet Take 1 tablet (40 mg total) by mouth daily. 10/21/20 01/19/21  Arnoldo Lenis, MD  predniSONE (STERAPRED UNI-PAK 21 TAB) 10 MG (21) TBPK tablet 10 mg DS 12 as directed 10/20/20   Mordecai Rasmussen, MD  zolpidem (AMBIEN) 10 MG tablet Take 1 tablet (10 mg total) by mouth at bedtime. 08/19/20   Fayrene Helper, MD    Family History Family History  Problem Relation Age of Onset   Diabetes Mother     Hypertension Father    Colon cancer Neg Hx     Social History Social History   Tobacco Use   Smoking status: Never   Smokeless tobacco: Never  Vaping Use   Vaping Use: Never used  Substance Use Topics   Alcohol use: Yes    Alcohol/week: 0.0 standard drinks    Comment: rare wine   Drug use: No     Allergies   Penicillins; Tramadol; and Zoster vaccine recombinant, adjuvanted   Review of Systems Review of Systems  Genitourinary:  Positive for frequency, hematuria and urgency. Negative for dysuria and vaginal discharge.  Musculoskeletal: Negative.     Physical Exam Triage Vital Signs ED Triage Vitals  Enc Vitals Group     BP 03/14/21 0811 (!) 154/75     Pulse Rate 03/14/21 0811 72     Resp 03/14/21 0811 18     Temp 03/14/21 0811 98.1 F (36.7 C)     Temp src --      SpO2 03/14/21 0811 98 %     Weight 03/14/21 0812 176 lb (79.8 kg)     Height --      Head Circumference --      Peak Flow --      Pain Score --      Pain Loc --      Pain Edu? --      Excl. in [place]? --    No data found.  Updated Vital Signs BP (!) 154/75 (BP Location: Right Arm)   Pulse 72   Temp 98.1 F (36.7 C)   Resp 18   Wt 79.8 kg   LMP 08/26/2014   SpO2 98%   BMI 28.41 kg/m   Visual Acuity Right Eye Distance:   Left Eye Distance:   Bilateral Distance:    Right Eye Near:   Left Eye Near:    Bilateral Near:     Physical Exam Vitals and nursing note reviewed.  Constitutional:      Appearance: Normal appearance.  Abdominal:     General: Bowel sounds are normal. There is no distension.     Palpations: Abdomen is soft.     Tenderness: There is no abdominal tenderness.     Hernia: No hernia is present.  Neurological:     Mental Status: She is alert.     UC Treatments / Results  Labs (all labs ordered are listed, but only abnormal results are displayed) Labs Reviewed  POCT URINALYSIS DIP (MANUAL ENTRY) - Abnormal; Notable for the following components:      Result Value    Clarity, UA cloudy (*)    Spec Grav, UA >=1.030 (*)    Blood, UA large (*)    Protein Ur, POC =100 (*)    Leukocytes, UA Large (3+) (*)    All other components within normal limits  URINE CULTURE    EKG   Radiology No results found.  Procedures Procedures (including critical care time)  Medications Ordered in UC Medications - No data to display  Initial Impression / Assessment and Plan / UC Course  I have reviewed the triage vital signs and the nursing notes.  Pertinent labs & imaging results that were available during my care of the patient were reviewed by me and considered in my medical decision making (see chart for details).     1.  Acute cystitis with hematuria: Point-of-care urinalysis is positive for blood and leukocyte esterase Urine cultures have been sent Bactrim double strength 1 tablet twice daily for 3 days Increase oral fluid intake We will call patient with recommendations if labs are abnormal. Final Clinical Impressions(s) / UC Diagnoses   Final diagnoses:  Hematuria, unspecified type  Acute cystitis with hematuria     Discharge Instructions      Increase oral fluid intake Take antibiotics as prescribed We will call you with recommendations if labs are abnormal    ED Prescriptions     Medication Sig Dispense Auth. Provider   sulfamethoxazole-trimethoprim (BACTRIM DS) 800-160 MG tablet Take 1 tablet by mouth 2 (two) times daily for 3 days. 6 tablet Xaiver Roskelley, Myrene Galas, MD      PDMP not reviewed this encounter.   Chase Picket, MD 03/14/21 [phone]

## 2021-03-16 LAB — URINE CULTURE: Culture: 40000 — AB

## 2021-03-30 DIAGNOSIS — I10 Essential (primary) hypertension: Secondary | ICD-10-CM | POA: Diagnosis not present

## 2021-04-03 DIAGNOSIS — I10 Essential (primary) hypertension: Secondary | ICD-10-CM | POA: Diagnosis not present

## 2021-04-03 DIAGNOSIS — E782 Mixed hyperlipidemia: Secondary | ICD-10-CM | POA: Diagnosis not present

## 2021-04-03 DIAGNOSIS — F330 Major depressive disorder, recurrent, mild: Secondary | ICD-10-CM | POA: Diagnosis not present

## 2021-04-03 DIAGNOSIS — G4700 Insomnia, unspecified: Secondary | ICD-10-CM | POA: Diagnosis not present

## 2021-04-16 DIAGNOSIS — H2511 Age-related nuclear cataract, right eye: Secondary | ICD-10-CM | POA: Diagnosis not present

## 2021-05-18 ENCOUNTER — Other Ambulatory Visit (HOSPITAL_COMMUNITY): Payer: Self-pay | Admitting: Obstetrics and Gynecology

## 2021-05-18 DIAGNOSIS — Z1231 Encounter for screening mammogram for malignant neoplasm of breast: Secondary | ICD-10-CM

## 2021-05-27 DIAGNOSIS — H2511 Age-related nuclear cataract, right eye: Secondary | ICD-10-CM | POA: Diagnosis not present

## 2021-06-03 ENCOUNTER — Other Ambulatory Visit: Payer: Self-pay

## 2021-06-03 ENCOUNTER — Ambulatory Visit (HOSPITAL_COMMUNITY): Payer: BC Managed Care – PPO

## 2021-06-03 ENCOUNTER — Ambulatory Visit (HOSPITAL_COMMUNITY)
Admission: RE | Admit: 2021-06-03 | Discharge: 2021-06-03 | Disposition: A | Discharge status: Home / Self Care | Payer: BC Managed Care – PPO | Source: Ambulatory Visit | Attending: Obstetrics and Gynecology | Admitting: Obstetrics and Gynecology

## 2021-06-03 DIAGNOSIS — Z1231 Encounter for screening mammogram for malignant neoplasm of breast: Secondary | ICD-10-CM

## 2021-06-03 DIAGNOSIS — H2513 Age-related nuclear cataract, bilateral: Secondary | ICD-10-CM | POA: Diagnosis not present

## 2021-08-06 ENCOUNTER — Ambulatory Visit: Payer: BC Managed Care – PPO

## 2021-08-06 ENCOUNTER — Other Ambulatory Visit: Payer: Self-pay

## 2021-08-06 ENCOUNTER — Encounter: Payer: Self-pay | Admitting: Orthopedic Surgery

## 2021-08-06 ENCOUNTER — Ambulatory Visit (INDEPENDENT_AMBULATORY_CARE_PROVIDER_SITE_OTHER): Payer: BC Managed Care – PPO | Admitting: Orthopedic Surgery

## 2021-08-06 VITALS — BP 131/75 | HR 71 | Ht 66.0 in | Wt 186.4 lb

## 2021-08-06 DIAGNOSIS — M25562 Pain in left knee: Secondary | ICD-10-CM

## 2021-08-06 NOTE — Progress Notes (Signed)
Orthopaedic Clinic Return  Assessment: Lori Sims is a 61 y.o. female with the following: Acute left knee pain, exacerbated by physical activity; no injury  Plan: Patient has pain in the left knee, started following recent exercise.  No specific injury.  No twisting event.  No swelling within her left knee.  Pain is throughout the left knee.  Medications are not improving her symptoms.  She has previously had injections, and she is interested in a left knee injection today.  Procedure note injection Left knee joint   Verbal consent was obtained to inject the left knee joint  Timeout was completed to confirm the site of injection.  The skin was prepped with alcohol and ethyl chloride was sprayed at the injection site.  A 21-gauge needle was used to inject 40 mg of Depo-Medrol and 1% lidocaine (3 cc) into the left knee using an anterolateral approach.  There were no complications. A sterile bandage was applied.       Follow-up: Return if symptoms worsen or fail to improve.   Subjective:  Chief Complaint  Patient presents with   Knee Pain    LEFT knee DOI 07/16/21  pt was exercising, knee was fine that day however the next day she had trouble bearing weight    History of Present Illness: Lori Sims is a 61 y.o. female who returns to clinic for evaluation of left knee pain.  Approximately 2-2 weeks ago, she started to have pain after exercise.  No specific injury.  She did twist her knee.  She denies any swelling around the knee.  Over-the-counter medications not providing sustained relief.  Pain gets worse with weightbearing.  She is walking with a cane currently.  Review of Systems: No fevers or chills No numbness or tingling No chest pain No shortness of breath No bowel or bladder dysfunction No GI distress No headaches   Objective: BP 131/75    Pulse 71    Ht 5\' 6"  (1.676 m)    Wt 186 lb 6.4 oz (84.6 kg)    LMP 08/26/2014    BMI 30.09 kg/m   Physical  Exam:  Alert and oriented.  No acute distress.  Ambulates with the assistance of a cane.  Left knee with no effusion.  No redness is appreciated.  The knee is not warm.  She can achieve full extension.  Flexion beyond 100 degrees.  Diffuse tenderness over the medial and lateral joint lines.  Minimal tenderness within the popliteal fossa.  No cyst is appreciated.  IMAGING: I personally ordered and reviewed the following images:  Left knee x-ray was obtained in clinic today.  No acute injuries are noted.  Mild loss of joint space within the medial lateral compartments.  Small osteophytes are noted within all 3 compartments.  Overall neutral alignment.  Impression: Left knee x-ray with mild degenerative changes overall  Mordecai Rasmussen, MD 08/06/2021 10:27 PM

## 2021-08-06 NOTE — Patient Instructions (Signed)

## 2021-08-19 ENCOUNTER — Ambulatory Visit
Admission: EM | Admit: 2021-08-19 | Discharge: 2021-08-19 | Disposition: A | Discharge status: Home / Self Care | Payer: BC Managed Care – PPO | Attending: Urgent Care | Admitting: Urgent Care

## 2021-08-19 ENCOUNTER — Other Ambulatory Visit: Payer: Self-pay

## 2021-08-19 DIAGNOSIS — N3001 Acute cystitis with hematuria: Secondary | ICD-10-CM | POA: Diagnosis not present

## 2021-08-19 DIAGNOSIS — R300 Dysuria: Secondary | ICD-10-CM | POA: Insufficient documentation

## 2021-08-19 DIAGNOSIS — R350 Frequency of micturition: Secondary | ICD-10-CM | POA: Diagnosis not present

## 2021-08-19 LAB — POCT URINALYSIS DIP (MANUAL ENTRY)
Bilirubin, UA: NEGATIVE
Glucose, UA: NEGATIVE mg/dL
Nitrite, UA: NEGATIVE
Protein Ur, POC: NEGATIVE mg/dL
Spec Grav, UA: 1.025 (ref 1.010–1.025)
Urobilinogen, UA: 0.2 E.U./dL
pH, UA: 5.5 (ref 5.0–8.0)

## 2021-08-19 MED ORDER — CEPHALEXIN 500 MG PO CAPS: 500.0000 mg | ORAL_CAPSULE | Freq: Two times a day (BID) | ORAL | 0 refills | Status: DC

## 2021-08-19 NOTE — ED Triage Notes (Signed)
Pt reports increased urinary frequency x 1 day.  ?

## 2021-08-19 NOTE — Discharge Instructions (Signed)

## 2021-08-19 NOTE — ED Provider Notes (Signed)
?[place] ? ? ?MRN: [id number] DOB: [birth year] ? ?Subjective:  ? ?Lori Sims is a 61 y.o. female presenting for 3 of acute onset dysuria, urinary frequency.  Patient has a history of UTIs and reports that this feels the same.  Last episode was in October.  Is responded well to antibiotic use.  No fever, nausea, vomiting, abdominal or pelvic pain, hematuria, flank tenderness.  No history of kidney stones.  Has not had to see a urologist for the symptoms. ? ?No current facility-administered medications for this encounter. ? ?Current Outpatient Medications:  ?  aspirin EC 81 MG tablet, Take 81 mg by mouth daily., Disp: , Rfl:  ?  atorvastatin (LIPITOR) 20 MG tablet, TAKE ONE TABLET BY MOUTH THREE TIMES WEEKLY, Disp: 36 tablet, Rfl: 3 ?  Calcium-Magnesium-Zinc (CAL-MAG-ZINC PO), Take 1 tablet by mouth daily., Disp: , Rfl:  ?  cholecalciferol (VITAMIN D3) 10 MCG (400 UNIT) TABS tablet, Take 400 Units by mouth daily., Disp: , Rfl:  ?  Ferrous Gluconate (IRON 27 PO), Take 27 mg by mouth daily., Disp: , Rfl:  ?  lisinopril (ZESTRIL) 40 MG tablet, Take 1 tablet (40 mg total) by mouth daily., Disp: 90 tablet, Rfl: 3 ?  zolpidem (AMBIEN) 10 MG tablet, Take 1 tablet (10 mg total) by mouth at bedtime., Disp: 30 tablet, Rfl: 5  ? ?Allergies  ?Allergen Reactions  ? Penicillins Itching and Nausea And Vomiting  ? Tramadol Nausea And Vomiting  ? Zoster Vaccine Recombinant, Adjuvanted Other (See Comments)  ?  Swollen red arm ?Swollen red arm  ? ? ?Past Medical History:  ?Diagnosis Date  ? GERD (gastroesophageal reflux disease)   ? Hypercholesterolemia   ? Hypertension   ?  ? ?Past Surgical History:  ?Procedure Laterality Date  ? CESAREAN SECTION    ? COLONOSCOPY  2010  ? Dr. Laural Golden: normal except external hemorrhoids  ? COLONOSCOPY N/A 09/12/2014  ? Procedure: COLONOSCOPY;  Surgeon: Danie Binder, MD; 2 tubular adenomas and 1 hyperplastic polyp, redundant left colon, external hemorrhoids.  Repeat in 2021 with  overtube.  ? COLONOSCOPY WITH PROPOFOL N/A 11/14/2019  ? Procedure: COLONOSCOPY WITH PROPOFOL;  Surgeon: Daneil Dolin, MD;  Location: [facility];  Service: Endoscopy;  Laterality: N/A;  8:30am  ? ESOPHAGEAL DILATION N/A 09/12/2014  ? Procedure: ESOPHAGEAL DILATION;  Surgeon: Danie Binder, MD;  Location: [facility];  Service: Endoscopy;  Laterality: N/A;  ? ESOPHAGOGASTRODUODENOSCOPY N/A 09/12/2014  ? Procedure: ESOPHAGOGASTRODUODENOSCOPY (EGD);  Surgeon: Danie Binder, MD; normal-appearing esophagus s/p empiric dilation, nonerosive gastritis s/p multiple biopsies, few small polyps in the gastric body s/p polypectomy, normal-appearing duodenum.  Pathology benign.  ? fibroid tumor right breast    ? fibroid tumors left breast    ? OVARIAN CYST DRAINAGE    ? OVARIAN CYST REMOVAL    ? ? ?Family History  ?Problem Relation Age of Onset  ? Diabetes Mother   ? Hypertension Father   ? Colon cancer Neg Hx   ? ? ?Social History  ? ?Tobacco Use  ? Smoking status: Never  ? Smokeless tobacco: Never  ?Vaping Use  ? Vaping Use: Never used  ?Substance Use Topics  ? Alcohol use: Yes  ?  Alcohol/week: 0.0 standard drinks  ?  Comment: rare wine  ? Drug use: No  ? ? ?ROS ? ? ?Objective:  ? ?Vitals: ?BP (!) 144/79 (BP Location: Right Arm)   Pulse 72   Temp 98.6 ?F (37 ?C) (Oral)  Resp 16   LMP 08/26/2014   SpO2 99%  ? ?Physical Exam ?Constitutional:   ?   General: She is not in acute distress. ?   Appearance: Normal appearance. She is well-developed. She is not ill-appearing, toxic-appearing or diaphoretic.  ?HENT:  ?   Head: Normocephalic and atraumatic.  ?   Nose: Nose normal.  ?   Mouth/Throat:  ?   Mouth: Mucous membranes are moist.  ?   Pharynx: Oropharynx is clear.  ?Eyes:  ?   General: No scleral icterus.    ?   Right eye: No discharge.     ?   Left eye: No discharge.  ?   Extraocular Movements: Extraocular movements intact.  ?   Conjunctiva/sclera: Conjunctivae normal.  ?Cardiovascular:  ?   Rate and Rhythm: Normal  rate.  ?Pulmonary:  ?   Effort: Pulmonary effort is normal.  ?Abdominal:  ?   General: Bowel sounds are normal. There is no distension.  ?   Palpations: Abdomen is soft. There is no mass.  ?   Tenderness: There is no abdominal tenderness. There is no right CVA tenderness, left CVA tenderness, guarding or rebound.  ?Skin: ?   General: Skin is warm and dry.  ?Neurological:  ?   General: No focal deficit present.  ?   Mental Status: She is alert and oriented to person, place, and time.  ?Psychiatric:     ?   Mood and Affect: Mood normal.     ?   Behavior: Behavior normal.     ?   Thought Content: Thought content normal.     ?   Judgment: Judgment normal.  ? ? ?Results for orders placed or performed during the hospital encounter of 08/19/21 (from the past 24 hour(s))  ?POCT urinalysis dipstick     Status: Abnormal  ? Collection Time: 08/19/21  6:13 PM  ?Result Value Ref Range  ? Color, UA yellow yellow  ? Clarity, UA hazy (A) clear  ? Glucose, UA negative negative mg/dL  ? Bilirubin, UA negative negative  ? Ketones, POC UA trace (5) (A) negative mg/dL  ? Spec Grav, UA 1.025 1.010 - 1.025  ? Blood, UA small (A) negative  ? pH, UA 5.5 5.0 - 8.0  ? Protein Ur, POC negative negative mg/dL  ? Urobilinogen, UA 0.2 0.2 or 1.0 E.U./dL  ? Nitrite, UA Negative Negative  ? Leukocytes, UA Small (1+) (A) Negative  ? ? ?Assessment and Plan :  ? ?PDMP not reviewed this encounter. ? ?1. Acute cystitis with hematuria   ?2. Dysuria   ?3. Urinary frequency   ? ?Start Keflex to cover for acute cystitis, urine culture pending.  Recommended aggressive hydration, limiting urinary irritants. Counseled patient on potential for adverse effects with medications prescribed/recommended today, ER and return-to-clinic precautions discussed, patient verbalized understanding. ? ?  ?Jaynee Eagles, PA-C ?08/19/21 1843 ? ?

## 2021-08-21 LAB — URINE CULTURE

## 2021-09-16 ENCOUNTER — Other Ambulatory Visit: Payer: Self-pay | Admitting: *Deleted

## 2021-09-16 DIAGNOSIS — I6523 Occlusion and stenosis of bilateral carotid arteries: Secondary | ICD-10-CM

## 2021-09-16 NOTE — Progress Notes (Signed)
c 

## 2021-09-16 NOTE — Addendum Note (Signed)
Addended by: Levonne Hubert on: 09/16/2021 11:18 AM ? ? Modules accepted: Orders ? ?

## 2021-09-21 DIAGNOSIS — M25562 Pain in left knee: Secondary | ICD-10-CM | POA: Diagnosis not present

## 2021-09-30 DIAGNOSIS — I10 Essential (primary) hypertension: Secondary | ICD-10-CM | POA: Diagnosis not present

## 2021-10-02 DIAGNOSIS — E782 Mixed hyperlipidemia: Secondary | ICD-10-CM | POA: Diagnosis not present

## 2021-10-02 DIAGNOSIS — I10 Essential (primary) hypertension: Secondary | ICD-10-CM | POA: Diagnosis not present

## 2021-10-02 DIAGNOSIS — F330 Major depressive disorder, recurrent, mild: Secondary | ICD-10-CM | POA: Diagnosis not present

## 2021-10-02 DIAGNOSIS — G4700 Insomnia, unspecified: Secondary | ICD-10-CM | POA: Diagnosis not present

## 2021-11-04 ENCOUNTER — Ambulatory Visit: Payer: BC Managed Care – PPO | Admitting: Cardiology

## 2021-11-16 ENCOUNTER — Telehealth: Payer: Self-pay

## 2021-11-16 DIAGNOSIS — Z6829 Body mass index (BMI) 29.0-29.9, adult: Secondary | ICD-10-CM | POA: Diagnosis not present

## 2021-11-16 DIAGNOSIS — R87612 Low grade squamous intraepithelial lesion on cytologic smear of cervix (LGSIL): Secondary | ICD-10-CM | POA: Diagnosis not present

## 2021-11-16 DIAGNOSIS — Z01419 Encounter for gynecological examination (general) (routine) without abnormal findings: Secondary | ICD-10-CM | POA: Diagnosis not present

## 2021-11-16 DIAGNOSIS — R350 Frequency of micturition: Secondary | ICD-10-CM | POA: Diagnosis not present

## 2021-11-16 MED ORDER — LISINOPRIL 40 MG PO TABS: 40.0000 mg | ORAL_TABLET | Freq: Every day | ORAL | 0 refills | Status: DC

## 2021-11-16 NOTE — Telephone Encounter (Signed)
Medication refill request for Lisinopril 40 mg tablets approved and sent to [facility]. #30 with 0 refills- Pt O/D for f/u visit with provider.

## 2021-11-25 ENCOUNTER — Ambulatory Visit (INDEPENDENT_AMBULATORY_CARE_PROVIDER_SITE_OTHER): Payer: BC Managed Care – PPO

## 2021-11-25 DIAGNOSIS — I6523 Occlusion and stenosis of bilateral carotid arteries: Secondary | ICD-10-CM

## 2021-11-30 ENCOUNTER — Telehealth: Payer: Self-pay

## 2021-11-30 NOTE — Telephone Encounter (Signed)
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

## 2021-11-30 NOTE — Telephone Encounter (Signed)
Patient notified and verbalized understanding. Pt had no questions or concerns at this time. PCP copied.  ?

## 2021-12-22 ENCOUNTER — Encounter: Payer: Self-pay | Admitting: Cardiology

## 2021-12-22 ENCOUNTER — Ambulatory Visit (INDEPENDENT_AMBULATORY_CARE_PROVIDER_SITE_OTHER): Payer: BC Managed Care – PPO | Admitting: Cardiology

## 2021-12-22 VITALS — BP 134/70 | HR 60 | Ht 66.0 in | Wt 186.2 lb

## 2021-12-22 DIAGNOSIS — I10 Essential (primary) hypertension: Secondary | ICD-10-CM | POA: Diagnosis not present

## 2021-12-22 DIAGNOSIS — I6523 Occlusion and stenosis of bilateral carotid arteries: Secondary | ICD-10-CM

## 2021-12-22 DIAGNOSIS — E782 Mixed hyperlipidemia: Secondary | ICD-10-CM | POA: Diagnosis not present

## 2021-12-22 MED ORDER — LISINOPRIL 40 MG PO TABS: 40.0000 mg | ORAL_TABLET | Freq: Every day | ORAL | 3 refills | Status: DC

## 2021-12-22 MED ORDER — ASPIRIN 81 MG PO TBEC: 81.0000 mg | DELAYED_RELEASE_TABLET | Freq: Every day | ORAL | 3 refills | Status: DC

## 2021-12-22 NOTE — Patient Instructions (Signed)
Medication Instructions:  Your physician recommends that you continue on your current medications as directed. Please refer to the Current Medication list given to you today.   Labwork: None  Testing/Procedures: None  Follow-Up:  Your physician recommends that you schedule a follow-up appointment in: 1 year  Any Other Special Instructions Will Be Listed Below (If Applicable).  You will receive a call in about 10 months reminding you to schedule your appointment. If you do not receive this call, please contact our office.  If you need a refill on your cardiac medications before your next appointment, please call your pharmacy.

## 2021-12-22 NOTE — Progress Notes (Signed)
Clinical Summary Ms. Lori Sims is a 61 y.o.female seen today for follow up of the following medical problems.    1. HTN - notes indicate she has felt her bp meds have contributed to insomnia and hair loss.  - has not been able to tolerate clonidine or amlodopine in the past. Norvasc caused leg swelling, chest pain. She does not recall the side effects to clonidien.  - has had some chronic issues with hypokalemia   - she lost weight a few years ago and bp came down too low, aldactone was stopped and lisinopril lowered  - at last visit bp's trended back up, lisinopril increased to '40mg'$  daily.  - compliant with meds    2. Carotid stenosis - 08/2020 carotid US: RICA 7-41%, LICA 28-78% - 11/7670 LICA RICA 0-94%, LICA 70-96% - she is on daily ASA, statin.     3. Hyperlipidemia - reports pcp changed atorva from 3 times a week to daily after most recent labs.   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      Past Medical History:  Diagnosis Date   GERD (gastroesophageal reflux disease)    Hypercholesterolemia    Hypertension      Allergies  Allergen Reactions   Penicillins Itching and Nausea And Vomiting   Tramadol Nausea And Vomiting   Zoster Vaccine Recombinant, Adjuvanted Other (See Comments)    Swollen red arm Swollen red arm     Current Outpatient Medications  Medication Sig Dispense Refill   aspirin EC 81 MG tablet Take 81 mg by mouth daily.     atorvastatin (LIPITOR) 20 MG tablet TAKE ONE TABLET BY MOUTH THREE TIMES WEEKLY 36 tablet 3   Calcium-Magnesium-Zinc (CAL-MAG-ZINC PO) Take 1 tablet by mouth daily.     cephALEXin (KEFLEX) 500 MG capsule Take 1 capsule (500 mg total) by mouth 2 (two) times daily. 10 capsule 0   cholecalciferol (VITAMIN D3) 10 MCG (400 UNIT) TABS tablet Take 400 Units by mouth daily.     Ferrous Gluconate (IRON 27 PO) Take 27 mg by mouth daily.     lisinopril (ZESTRIL) 40 MG tablet Take 1 tablet (40 mg total) by mouth daily. 30  tablet 0   zolpidem (AMBIEN) 10 MG tablet Take 1 tablet (10 mg total) by mouth at bedtime. 30 tablet 5   No current facility-administered medications for this visit.     Past Surgical History:  Procedure Laterality Date   CESAREAN SECTION     COLONOSCOPY  2010   Dr. Laural Golden: normal except external hemorrhoids   COLONOSCOPY N/A 09/12/2014   Procedure: COLONOSCOPY;  Surgeon: Danie Binder, MD; 2 tubular adenomas and 1 hyperplastic polyp, redundant left colon, external hemorrhoids.  Repeat in 2021 with overtube.   COLONOSCOPY WITH PROPOFOL N/A 11/14/2019   Procedure: COLONOSCOPY WITH PROPOFOL;  Surgeon: Daneil Dolin, MD;  [facility]: [facility];  Service: Endoscopy;  Laterality: N/A;  8:30am   ESOPHAGEAL DILATION N/A 09/12/2014   Procedure: ESOPHAGEAL DILATION;  Surgeon: Danie Binder, MD;  [facility];  Service: Endoscopy;  Laterality: N/A;   ESOPHAGOGASTRODUODENOSCOPY N/A 09/12/2014   Procedure: ESOPHAGOGASTRODUODENOSCOPY (EGD);  Surgeon: Danie Binder, MD; normal-appearing esophagus s/p empiric dilation, nonerosive gastritis s/p multiple biopsies, few small polyps in the gastric body s/p polypectomy, normal-appearing duodenum.  Pathology benign.   fibroid tumor right breast     fibroid tumors left breast     OVARIAN CYST DRAINAGE     OVARIAN CYST  REMOVAL       Allergies  Allergen Reactions   Penicillins Itching and Nausea And Vomiting   Tramadol Nausea And Vomiting   Zoster Vaccine Recombinant, Adjuvanted Other (See Comments)    Swollen red arm Swollen red arm      Family History  Problem Relation Age of Onset   Diabetes Mother    Hypertension Father    Colon cancer Neg Hx      Social History Ms. Lori Sims reports that she has never smoked. She has never used smokeless tobacco. Ms. Lori Sims reports current alcohol use.   Review of Systems CONSTITUTIONAL: No weight loss, fever, chills, weakness or fatigue.  HEENT: Eyes: No visual loss, blurred vision, double  vision or yellow sclerae.No hearing loss, sneezing, congestion, runny nose or sore throat.  SKIN: No rash or itching.  CARDIOVASCULAR: per hpi RESPIRATORY: No shortness of breath, cough or sputum.  GASTROINTESTINAL: No anorexia, nausea, vomiting or diarrhea. No abdominal pain or blood.  GENITOURINARY: No burning on urination, no polyuria NEUROLOGICAL: No headache, dizziness, syncope, paralysis, ataxia, numbness or tingling in the extremities. No change in bowel or bladder control.  MUSCULOSKELETAL: No muscle, back pain, joint pain or stiffness.  LYMPHATICS: No enlarged nodes. No history of splenectomy.  PSYCHIATRIC: No history of depression or anxiety.  ENDOCRINOLOGIC: No reports of sweating, cold or heat intolerance. No polyuria or polydipsia.  Marland Kitchen   Physical Examination Today's Vitals   12/22/21 0831  BP: 140/80  Pulse: 60  SpO2: 98%  Weight: 186 lb 3.2 oz (84.5 kg)  Height: '5\' 6"'$  (1.676 m)   Body mass index is 30.05 kg/m.  Gen: resting comfortably, no acute distress HEENT: no scleral icterus, pupils equal round and reactive, no palptable cervical adenopathy,  CV: RRR, no m/r/g, no jvd Resp: Clear to auscultation bilaterally GI: abdomen is soft, non-tender, non-distended, normal bowel sounds, no hepatosplenomegaly MSK: extremities are warm, no edema.  Skin: warm, no rash Neuro:  no focal deficits Psych: appropriate affect   Diagnostic Studies  08/2019 echo IMPRESSIONS     1. Left ventricular ejection fraction, by estimation, is 60 to 65%. The  left ventricle has normal function. The left ventricle has no regional  wall motion abnormalities. Left ventricular diastolic parameters were  normal.   2. Right ventricular systolic function is normal. The right ventricular  size is normal.   3. The mitral valve is normal in structure. No evidence of mitral valve  regurgitation. No evidence of mitral stenosis.   4. The aortic valve is tricuspid. Aortic valve regurgitation is  not  visualized. No aortic stenosis is present.   5. The inferior vena cava is normal in size with greater than 50%  respiratory variability, suggesting right atrial pressure of 3 mmHg.      09/2019 carotid US Summary:  Right Carotid: Velocities in the right ICA are consistent with a 1-39%  stenosis.   Left Carotid: Velocities in the left ICA are consistent with a 40-59%  stenosis.      09/2020 carotid US RICA 9-48%, LICA 54-62%   12/348 carotid US Summary:  Right Carotid: Velocities in the right ICA are consistent with a 1-39%  stenosis.   Left Carotid: Velocities in the left ICA are consistent with a 40-59%  stenosis.   Vertebrals:  Bilateral vertebral arteries demonstrate antegrade flow.  Subclavians: Normal flow hemodynamics were seen in bilateral subclavian               arteries.    Assessment  and Plan   1. HTN - essentailly at goal, continue current med - could restart her aldactone if bp's trend back up over time.    2. Carotid stenosis - continue ASA, statin - repeat US next year.    3.Hyperlipidemia - request labs from pcp, continue atorvastatin.   F/u 1 year.    Arnoldo Lenis, M.D.

## 2021-12-28 ENCOUNTER — Other Ambulatory Visit: Payer: Self-pay | Admitting: Cardiology

## 2021-12-28 DIAGNOSIS — I6523 Occlusion and stenosis of bilateral carotid arteries: Secondary | ICD-10-CM

## 2021-12-29 ENCOUNTER — Encounter: Payer: Self-pay | Admitting: Cardiology

## 2022-01-02 ENCOUNTER — Emergency Department (HOSPITAL_COMMUNITY)
Admission: EM | Admit: 2022-01-02 | Discharge: 2022-01-02 | Disposition: A | Discharge status: Home / Self Care | Payer: BC Managed Care – PPO | Attending: Emergency Medicine | Admitting: Emergency Medicine

## 2022-01-02 ENCOUNTER — Encounter (HOSPITAL_COMMUNITY): Payer: Self-pay

## 2022-01-02 ENCOUNTER — Other Ambulatory Visit: Payer: Self-pay

## 2022-01-02 ENCOUNTER — Emergency Department (HOSPITAL_COMMUNITY): Payer: BC Managed Care – PPO

## 2022-01-02 DIAGNOSIS — H538 Other visual disturbances: Secondary | ICD-10-CM

## 2022-01-02 DIAGNOSIS — Z79899 Other long term (current) drug therapy: Secondary | ICD-10-CM | POA: Insufficient documentation

## 2022-01-02 DIAGNOSIS — R5383 Other fatigue: Secondary | ICD-10-CM | POA: Diagnosis not present

## 2022-01-02 DIAGNOSIS — Z7982 Long term (current) use of aspirin: Secondary | ICD-10-CM | POA: Diagnosis not present

## 2022-01-02 DIAGNOSIS — I10 Essential (primary) hypertension: Secondary | ICD-10-CM | POA: Diagnosis not present

## 2022-01-02 DIAGNOSIS — R030 Elevated blood-pressure reading, without diagnosis of hypertension: Secondary | ICD-10-CM

## 2022-01-02 DIAGNOSIS — R5381 Other malaise: Secondary | ICD-10-CM | POA: Diagnosis not present

## 2022-01-02 DIAGNOSIS — H53123 Transient visual loss, bilateral: Secondary | ICD-10-CM | POA: Diagnosis not present

## 2022-01-02 DIAGNOSIS — R519 Headache, unspecified: Secondary | ICD-10-CM

## 2022-01-02 LAB — CBC WITH DIFFERENTIAL/PLATELET
Abs Immature Granulocytes: 0.01 10*3/uL (ref 0.00–0.07)
Basophils Absolute: 0 10*3/uL (ref 0.0–0.1)
Basophils Relative: 1 %
Eosinophils Absolute: 0.2 10*3/uL (ref 0.0–0.5)
Eosinophils Relative: 2 %
HCT: 35.1 % — ABNORMAL LOW (ref 36.0–46.0)
Hemoglobin: 11.6 g/dL — ABNORMAL LOW (ref 12.0–15.0)
Immature Granulocytes: 0 %
Lymphocytes Relative: 45 %
Lymphs Abs: 3.4 10*3/uL (ref 0.7–4.0)
MCH: 30.5 pg (ref 26.0–34.0)
MCHC: 33 g/dL (ref 30.0–36.0)
MCV: 92.4 fL (ref 80.0–100.0)
Monocytes Absolute: 0.5 10*3/uL (ref 0.1–1.0)
Monocytes Relative: 6 %
Neutro Abs: 3.5 10*3/uL (ref 1.7–7.7)
Neutrophils Relative %: 46 %
Platelets: 275 10*3/uL (ref 150–400)
RBC: 3.8 MIL/uL — ABNORMAL LOW (ref 3.87–5.11)
RDW: 12.4 % (ref 11.5–15.5)
WBC: 7.6 10*3/uL (ref 4.0–10.5)
nRBC: 0 % (ref 0.0–0.2)

## 2022-01-02 LAB — BASIC METABOLIC PANEL
Anion gap: 6 (ref 5–15)
BUN: 13 mg/dL (ref 8–23)
CO2: 26 mmol/L (ref 22–32)
Calcium: 9 mg/dL (ref 8.9–10.3)
Chloride: 105 mmol/L (ref 98–111)
Creatinine, Ser: 1.02 mg/dL — ABNORMAL HIGH (ref 0.44–1.00)
GFR, Estimated: >60 mL/min (ref >60)
Glucose, Bld: 80 mg/dL (ref 70–99)
Potassium: 3.6 mmol/L (ref 3.5–5.1)
Sodium: 137 mmol/L (ref 135–145)

## 2022-01-02 LAB — URINALYSIS, ROUTINE W REFLEX MICROSCOPIC
Bilirubin Urine: NEGATIVE
Glucose, UA: NEGATIVE mg/dL
Ketones, ur: NEGATIVE mg/dL
Leukocytes,Ua: NEGATIVE
Nitrite: NEGATIVE
Protein, ur: NEGATIVE mg/dL
Specific Gravity, Urine: 1.009 (ref 1.005–1.030)
pH: 6 (ref 5.0–8.0)

## 2022-01-02 NOTE — ED Notes (Signed)
Patient transported to CT 

## 2022-01-02 NOTE — ED Triage Notes (Signed)
Pov from home. Cc of "just not feeling good"  C/o high blood pressure -165/98 and a headache. Has been feeling bad for a couple days. Messaged pcp on the 19th but got no response.

## 2022-01-02 NOTE — ED Notes (Signed)
Went over dc papers. All questions answered. Ambulatory to lobby .  

## 2022-01-02 NOTE — Discharge Instructions (Addendum)
Your CT scan of your EKG, your labs are all extremely reassuring, there is no evidence of any emergent condition, stroke, brain mass or other significant cause of your headaches.  Your blood pressure is only moderately elevated and does not constitute hypertensive emergency.  You need to follow closely with Dr. [place] Crane and have him adjust your medications if your blood pressure is staying elevated. Follow-up with Dr. [place] Crane if you continue to get headaches, take Tylenol when you get a headache.  Discussed with Dr. [place] Crane your burning abdomen pain.   You are having a headache. No specific cause was found today for your headache. It may have been a migraine or other cause of headache. Stress, anxiety, fatigue, and depression are common triggers for headaches. Your headache today does not appear to be life-threatening or require hospitalization, but often the exact cause of headaches is not determined in the emergency department. Therefore, follow-up with your doctor is very important to find out what may have caused your headache, and whether or not you need any further diagnostic testing or treatment. Sometimes headaches can appear benign (not harmful), but then more serious symptoms can develop which should prompt an immediate re-evaluation by your doctor or the emergency department.  SEEK MEDICAL ATTENTION IF:  You develop possible problems with medications prescribed.  The medications don't resolve your headache, if it recurs , or if you have multiple episodes of vomiting or can't take fluids. You have a change from the usual headache. RETURN IMMEDIATELY IF you develop a sudden, severe headache or confusion, become poorly responsive or faint, develop a fever above 100.26F or problem breathing, have a change in speech, vision, swallowing, or understanding, or develop new weakness, numbness, tingling, incoordination, or have a seizure.

## 2022-01-02 NOTE — ED Provider Notes (Signed)
[facility] EMERGENCY DEPARTMENT Provider Note   CSN: [id number] Arrival date & time: 01/02/22  1930     History  Chief Complaint  Patient presents with   Hypertension    Lori Sims is a 61 y.o. female who presents with multiple complaints.  Chiefly patient has had headache and blurry vision.  Her blurry vision has been constant for greater than 1 week.  She has had intermittent headache but does not currently have 1.  She states that the headache changes places in her head sometimes is in the front sometimes is in her neck.  She has no previous history of headaches and denies unilateral weakness, difficulty with speech, vision field cuts, abnormal gait. Patient states that she is also been taking her blood pressure at home and has noted that has been elevated.  She is on a single agent, lisinopril.  She has had no recent changes to her medications.  She is on 81 mg enteric-coated aspirin daily and states that she is also noticed some epigastric burning sensation which is worse at night.  She denies fever, neck stiffness.  She denies chest pain or shortness of breath.  She has had generalized malaise and "just not feeling good."  She denies cough, urinary symptoms, flank pain.   Hypertension       Home Medications Prior to Admission medications   Medication Sig Start Date End Date Taking? Authorizing Provider  aspirin EC 81 MG tablet Take 1 tablet (81 mg total) by mouth daily. Swallow whole. 12/22/21   Arnoldo Lenis, MD  atorvastatin (LIPITOR) 20 MG tablet Take 20 mg by mouth daily.    [provider]  Calcium-Magnesium-Zinc (CAL-MAG-ZINC PO) Take 1 tablet by mouth daily.    [provider]  cholecalciferol (VITAMIN D3) 10 MCG (400 UNIT) TABS tablet Take 400 Units by mouth daily.    [provider]  folic acid (FOLVITE) 1 MG tablet Take 1 mg by mouth daily.    [provider]  lisinopril (ZESTRIL) 40 MG tablet Take 1 tablet (40 mg total) by  mouth daily. 12/22/21   Arnoldo Lenis, MD      Allergies    Penicillins; Tramadol; and Zoster vaccine recombinant, adjuvanted    Review of Systems   Review of Systems  Physical Exam Updated Vital Signs BP (!) 154/77   Pulse (!) 57   Temp 98.2 F (36.8 C) (Oral)   Resp 16   Ht '5\' 6"'$  (1.676 m)   Wt 84.4 kg   LMP 08/26/2014   SpO2 100%   BMI 30.02 kg/m  Physical Exam Physical Exam  Constitutional: Pt is oriented to person, place, and time. Pt appears well-developed and well-nourished. No distress.  HENT:  Head: Normocephalic and atraumatic.  Mouth/Throat: Oropharynx is clear and moist.  Eyes: Conjunctivae and EOM are normal. Pupils are equal, round, and reactive to light. No scleral icterus.  No horizontal, vertical or rotational nystagmus  Neck: Normal range of motion. Neck supple.  Full active and passive ROM without pain No midline or paraspinal tenderness No nuchal rigidity or meningeal signs  Cardiovascular: Normal rate, regular rhythm and intact distal pulses.   Pulmonary/Chest: Effort normal and breath sounds normal. No respiratory distress. Pt has no wheezes. No rales.  Abdominal: Soft. Bowel sounds are normal. There is no tenderness. There is no rebound and no guarding.  Musculoskeletal: Normal range of motion.  Lymphadenopathy:    No cervical adenopathy.  Neurological: Pt. is alert and oriented to  person, place, and time. He has normal reflexes. No cranial nerve deficit.  Exhibits normal muscle tone. Coordination normal.  Mental Status:  Alert, oriented, thought content appropriate. Speech fluent without evidence of aphasia. Able to follow 2 step commands without difficulty.  Cranial Nerves:  II:  Peripheral visual fields grossly normal, pupils equal, round, reactive to light III,IV, VI: ptosis not present, extra-ocular motions intact bilaterally  V,VII: smile symmetric, facial light touch sensation equal VIII: hearing grossly normal bilaterally  IX,X:  midline uvula rise  XI: bilateral shoulder shrug equal and strong XII: midline tongue extension  Motor:  5/5 in upper and lower extremities bilaterally including strong and equal grip strength and dorsiflexion/plantar flexion Sensory: Pinprick and light touch normal in all extremities.  Deep Tendon Reflexes: 2+ and symmetric  Cerebellar: normal finger-to-nose with bilateral upper extremities Gait: normal gait and balance CV: distal pulses palpable throughout   Skin: Skin is warm and dry. No rash noted. Pt is not diaphoretic.  Psychiatric: Pt has a normal mood and affect. Behavior is normal. Judgment and thought content normal.  Nursing note and vitals reviewed.  ED Results / Procedures / Treatments   Labs (all labs ordered are listed, but only abnormal results are displayed) Labs Reviewed  BASIC METABOLIC PANEL  CBC WITH DIFFERENTIAL/PLATELET  URINALYSIS, ROUTINE W REFLEX MICROSCOPIC    EKG None  Radiology No results found.  Procedures Procedures    Medications Ordered in ED Medications - No data to display  ED Course/ Medical Decision Making/ A&P                           Medical Decision Making Patient here with mild symptoms of headache, no focal neurologic deficits, vision grossly intact.  She does wear glasses.  Also having hypertension hypertension.    Given the large differential diagnosis for [facility], the decision making in this case is of high complexity.  After evaluating all of the data points in this case, the presentation of Lori Sims is NOT consistent with skull fracture, meningitis/encephalitis, SAH/sentinel bleed, Intracranial Hemorrhage (ICH) (subdural/epidural), acute obstructive hydrocephalus, space occupying lesions, CVA, CO Poisoning, Basilar/vertebral artery dissection, preeclampsia, cerebral venous thrombosis, hypertensive emergency, temporal Arteritis, Idiopathic Intracranial Hypertension (pseudotumor cerebri). Patient does not appear to  have hypertensive urgency or emergency.  Strict return and follow-up precautions have been given by me personally or by detailed written instructions verbalized by nursing staff using the teach back method to patient/family/caregiver.  Data Reviewed/Counseling: I have reviewed the patient's vital signs, nursing notes, and other relevant tests/information. I had a detailed discussion regarding the historical points, exam findings, and any diagnostic results supporting the discharge diagnosis. I also discussed the need for outpatient follow-up and the need to return to the ED if symptoms worsen or if there are any questions or concerns that arise at hom    Amount and/or Complexity of Data Reviewed Labs: ordered.    Details: I reviewed the patient's labs all of them are at baseline, no acute findings, labs reassuring Radiology: ordered and independent interpretation performed.    Details: I visualized and interpreted CT head which shows no acute findings ECG/medicine tests:     Details: EKG shows normal sinus rhythm           Final Clinical Impression(s) / ED Diagnoses Final diagnoses:  Malaise  Intermittent headache  Elevated blood pressure reading  Blurry vision, bilateral    Rx / DC Orders ED Discharge Orders  None         Margarita Mail, PA-C 01/02/22 Janus Molder    Noemi Chapel, MD 01/06/22 [phone]

## 2022-01-03 ENCOUNTER — Telehealth: Payer: Self-pay | Admitting: Cardiology

## 2022-01-03 ENCOUNTER — Other Ambulatory Visit: Payer: Self-pay | Admitting: *Deleted

## 2022-01-03 DIAGNOSIS — Z79899 Other long term (current) drug therapy: Secondary | ICD-10-CM

## 2022-01-03 DIAGNOSIS — I10 Essential (primary) hypertension: Secondary | ICD-10-CM

## 2022-01-03 MED ORDER — SPIRONOLACTONE 25 MG PO TABS: 25.0000 mg | ORAL_TABLET | Freq: Every day | ORAL | 1 refills | Status: DC

## 2022-01-03 MED ORDER — CLOPIDOGREL BISULFATE 75 MG PO TABS: 75.0000 mg | ORAL_TABLET | Freq: Every day | ORAL | 1 refills | Status: DC

## 2022-01-03 NOTE — Telephone Encounter (Signed)
Responded to another message earlier and we started her on aldactone, just seeing this one now. She could stop aspirin and start plavix '75mg'$  daily   Zandra Abts MD

## 2022-01-03 NOTE — Telephone Encounter (Signed)
Pt c/o medication issue:  1. Name of Medication:   lisinopril (ZESTRIL) 40 MG tablet    2. How are you currently taking this medication (dosage and times per day)?   Take 1 tablet (40 mg total) by mouth daily.      3. Are you having a reaction (difficulty breathing--STAT)? No  4. What is your medication issue? Pt states that medication is not helping with lowering BP. Pt states that BP is still running high even after a visit to the ER. Pt said that last night BP was 165/98. Please advise

## 2022-01-03 NOTE — Telephone Encounter (Signed)
Start aldactone '25mg'$  daily, needs bmet in 2 weeks please. Aldactone would be taken in addition to the lisinopril  Zandra Abts MD

## 2022-01-25 ENCOUNTER — Other Ambulatory Visit: Payer: Self-pay | Admitting: *Deleted

## 2022-01-25 MED ORDER — CLOPIDOGREL BISULFATE 75 MG PO TABS: 75.0000 mg | ORAL_TABLET | Freq: Every day | ORAL | 1 refills | Status: DC

## 2022-02-07 ENCOUNTER — Other Ambulatory Visit: Payer: Self-pay | Admitting: *Deleted

## 2022-02-07 MED ORDER — SPIRONOLACTONE 25 MG PO TABS: 25.0000 mg | ORAL_TABLET | Freq: Every day | ORAL | 1 refills | Status: DC

## 2022-04-04 ENCOUNTER — Ambulatory Visit (INDEPENDENT_AMBULATORY_CARE_PROVIDER_SITE_OTHER): Payer: BC Managed Care – PPO | Admitting: Orthopedic Surgery

## 2022-04-04 ENCOUNTER — Encounter: Payer: Self-pay | Admitting: Orthopedic Surgery

## 2022-04-04 DIAGNOSIS — G8929 Other chronic pain: Secondary | ICD-10-CM | POA: Diagnosis not present

## 2022-04-04 DIAGNOSIS — M25562 Pain in left knee: Secondary | ICD-10-CM | POA: Diagnosis not present

## 2022-04-04 MED ORDER — METHYLPREDNISOLONE ACETATE 40 MG/ML IJ SUSP
40.0000 mg | Freq: Once | INTRAMUSCULAR | Status: AC
  Administered 2022-04-04: 40 mg via INTRA_ARTICULAR

## 2022-04-04 NOTE — Progress Notes (Signed)
Orthopaedic Clinic Return  Assessment: Lori Sims is a 61 y.o. female with the following: Chronic left knee pain  Plan: Lori Sims continues to have pain in the left knee.  Prior injections only provided symptomatic relief for couple of weeks.  She would like to proceed with another injection.  We briefly discussed the possibility of physical therapy and/or scheduling an MRI.  She states she does activities at home, and would like to see how well this injection works.  She will contact us if she would like to proceed with formal physical therapy.  Procedure note injection Left knee joint   Verbal consent was obtained to inject the left knee joint  Timeout was completed to confirm the site of injection.  The skin was prepped with alcohol and ethyl chloride was sprayed at the injection site.  A 21-gauge needle was used to inject 40 mg of Depo-Medrol and 1% lidocaine (3 cc) into the left knee using an anterolateral approach.  There were no complications. A sterile bandage was applied.    Follow-up: Return if symptoms worsen or fail to improve.   Subjective:  Chief Complaint  Patient presents with   Knee Pain    LT knee/ had injection in the spring, lasted a couple of weeks Patient is interested in another injection today    History of Present Illness: Lori Sims is a 61 y.o. female who returns to clinic for evaluation of left knee pain.  I saw her in clinic several months ago.  At that time, we injected her left knee.  She notes that it was helpful for just a few weeks.  Since then, her knee pain is progressively worsened.  She states she has pain throughout the knee.  She also notes that there is some pain radiating distally into her shin.  She does activities and exercises for her knee at home.  She has not worked with physical therapy.  Review of Systems: No fevers or chills No numbness or tingling No chest pain No shortness of breath No bowel or bladder dysfunction No  GI distress No headaches   Objective: LMP 08/26/2014   Physical Exam:  Alert and oriented.  No acute distress.  Left knee with no effusion.  No redness is appreciated.  The knee is not warm.  She can achieve full extension.  Flexion beyond 100 degrees.  Diffuse tenderness over the lateral joint lines.  Minimal tenderness within the popliteal fossa.  No cyst is appreciated.  Crepitus with range of motion testing.  She states that it is painful behind her patella.  IMAGING: I personally ordered and reviewed the following images:  No new imaging obtained today.  Mordecai Rasmussen, MD 04/04/2022 9:37 AM

## 2022-04-04 NOTE — Addendum Note (Signed)
Addended by: Obie Dredge A on: 04/04/2022 10:08 AM   Modules accepted: Orders

## 2022-04-04 NOTE — Patient Instructions (Signed)

## 2022-04-05 ENCOUNTER — Other Ambulatory Visit (HOSPITAL_COMMUNITY): Payer: Self-pay | Admitting: Internal Medicine

## 2022-04-05 DIAGNOSIS — Z1231 Encounter for screening mammogram for malignant neoplasm of breast: Secondary | ICD-10-CM

## 2022-04-20 DIAGNOSIS — E785 Hyperlipidemia, unspecified: Secondary | ICD-10-CM | POA: Diagnosis not present

## 2022-04-20 DIAGNOSIS — I10 Essential (primary) hypertension: Secondary | ICD-10-CM | POA: Diagnosis not present

## 2022-04-23 DIAGNOSIS — F330 Major depressive disorder, recurrent, mild: Secondary | ICD-10-CM | POA: Diagnosis not present

## 2022-04-23 DIAGNOSIS — E782 Mixed hyperlipidemia: Secondary | ICD-10-CM | POA: Diagnosis not present

## 2022-04-23 DIAGNOSIS — G4700 Insomnia, unspecified: Secondary | ICD-10-CM | POA: Diagnosis not present

## 2022-04-23 DIAGNOSIS — I10 Essential (primary) hypertension: Secondary | ICD-10-CM | POA: Diagnosis not present

## 2022-04-29 ENCOUNTER — Encounter: Payer: Self-pay | Admitting: Orthopedic Surgery

## 2022-04-29 DIAGNOSIS — G8929 Other chronic pain: Secondary | ICD-10-CM

## 2022-05-20 NOTE — Therapy (Incomplete)
OUTPATIENT PHYSICAL THERAPY LOWER EXTREMITY EVALUATION   Patient Name: Lori Sims MRN: [id number] DOB:24-Feb-1961, 61 y.o., female Today's Date: 05/20/2022  END OF SESSION:   Past Medical History:  Diagnosis Date   GERD (gastroesophageal reflux disease)    Hypercholesterolemia    Hypertension    Past Surgical History:  Procedure Laterality Date   CESAREAN SECTION     COLONOSCOPY  2010   Dr. Laural Golden: normal except external hemorrhoids   COLONOSCOPY N/A 09/12/2014   Procedure: COLONOSCOPY;  Surgeon: Danie Binder, MD; 2 tubular adenomas and 1 hyperplastic polyp, redundant left colon, external hemorrhoids.  Repeat in 2021 with overtube.   COLONOSCOPY WITH PROPOFOL N/A 11/14/2019   Procedure: COLONOSCOPY WITH PROPOFOL;  Surgeon: Daneil Dolin, MD;  Location: [facility];  Service: Endoscopy;  Laterality: N/A;  8:30am   ESOPHAGEAL DILATION N/A 09/12/2014   Procedure: ESOPHAGEAL DILATION;  Surgeon: Danie Binder, MD;  Location: [facility];  Service: Endoscopy;  Laterality: N/A;   ESOPHAGOGASTRODUODENOSCOPY N/A 09/12/2014   Procedure: ESOPHAGOGASTRODUODENOSCOPY (EGD);  Surgeon: Danie Binder, MD; normal-appearing esophagus s/p empiric dilation, nonerosive gastritis s/p multiple biopsies, few small polyps in the gastric body s/p polypectomy, normal-appearing duodenum.  Pathology benign.   fibroid tumor right breast     fibroid tumors left breast     OVARIAN CYST DRAINAGE     OVARIAN CYST REMOVAL     Patient Active Problem List   Diagnosis Date Noted   Hematuria 02/02/2020   Abnormal Pap smear of cervix 01/30/2020   History of adenomatous polyp of colon 08/19/2019   Tubular adenoma of colon 07/15/2019   Obstructive sleep apnea 11/05/2015   Alopecia 06/10/2015   Insomnia 06/10/2015   Dysphagia, pharyngoesophageal phase 08/27/2014   Anemia 08/27/2014   Hot flashes, menopausal 07/17/2014   Overweight (BMI 25.0-29.9) 85/27/7824   Metabolic syndrome X 23/53/6144   Vitamin D  deficiency 12/02/2010   Hyperlipidemia 02/05/2010   KNEE PAIN, RIGHT 01/27/2010   B12 DEFICIENCY 07/10/2008   Essential hypertension 07/06/2008   GOITER, UNSPECIFIED 07/02/2008   ENDOMETRIOSIS 07/02/2008   IRON DEFIC ANEMIA SEC DIET IRON INTAKE 04/28/2008   Allergic rhinitis 04/28/2008    PCP: ***  REFERRING PROVIDER: ***  REFERRING DIAG: ***  THERAPY DIAG:  No diagnosis found.  Rationale for Evaluation and Treatment: {HABREHAB:27488}  ONSET DATE: ***  SUBJECTIVE:   SUBJECTIVE STATEMENT: ***  PERTINENT HISTORY: *** PAIN:  Are you having pain? {OPRCPAIN:27236}  PRECAUTIONS: {Therapy precautions:24002}  WEIGHT BEARING RESTRICTIONS: {Yes ***/No:24003}  FALLS:  Has patient fallen in last 6 months? {fallsyesno:27318}  LIVING ENVIRONMENT: Lives with: {OPRC lives with:25569::"lives with their family"} Lives in: {Lives in:25570} Stairs: {opstairs:27293} Has following equipment at home: {Assistive devices:23999}  OCCUPATION: ***  PLOF: {PLOF:24004}  PATIENT GOALS: ***  NEXT MD VISIT:   OBJECTIVE:   DIAGNOSTIC FINDINGS: ***  PATIENT SURVEYS:  {rehab surveys:24030}  COGNITION: Overall cognitive status: {cognition:24006}     SENSATION: {sensation:27233}  EDEMA:  {edema:24020}  MUSCLE LENGTH: Hamstrings: Right *** deg; Left *** deg Thomas test: Right *** deg; Left *** deg  POSTURE: {posture:25561}  PALPATION: ***  LOWER EXTREMITY ROM:  {AROM/PROM:27142} ROM Right eval Left eval  Hip flexion    Hip extension    Hip abduction    Hip adduction    Hip internal rotation    Hip external rotation    Knee flexion    Knee extension    Ankle dorsiflexion    Ankle plantarflexion    Ankle inversion  Ankle eversion     (Blank rows = not tested)  LOWER EXTREMITY MMT:  MMT Right eval Left eval  Hip flexion    Hip extension    Hip abduction    Hip adduction    Hip internal rotation    Hip external rotation    Knee flexion    Knee  extension    Ankle dorsiflexion    Ankle plantarflexion    Ankle inversion    Ankle eversion     (Blank rows = not tested)  LOWER EXTREMITY SPECIAL TESTS:  {LEspecialtests:26242}  FUNCTIONAL TESTS:  {Functional tests:24029}  GAIT: Distance walked: *** Assistive device utilized: {Assistive devices:23999} Level of assistance: {Levels of assistance:24026} Comments: ***   TODAY'S TREATMENT:                                                                                                                              DATE: ***    PATIENT EDUCATION:  Education details: *** Person educated: {Person educated:25204} Education method: {Education Method:25205} Education comprehension: {Education Comprehension:25206}  HOME EXERCISE PROGRAM: ***  ASSESSMENT:  CLINICAL IMPRESSION: Patient is a *** y.o. *** who was seen today for physical therapy evaluation and treatment for ***.   OBJECTIVE IMPAIRMENTS: {opptimpairments:25111}.   ACTIVITY LIMITATIONS: {activitylimitations:27494}  PARTICIPATION LIMITATIONS: {participationrestrictions:25113}  PERSONAL FACTORS: {Personal factors:25162} are also affecting patient's functional outcome.   REHAB POTENTIAL: {rehabpotential:25112}  CLINICAL DECISION MAKING: {clinical decision making:25114}  EVALUATION COMPLEXITY: {Evaluation complexity:25115}   GOALS: Goals reviewed with patient? {yes/no:20286}  SHORT TERM GOALS: Target date: *** *** Baseline: Goal status: {GOALSTATUS:25110}  2.  *** Baseline:  Goal status: {GOALSTATUS:25110}  3.  *** Baseline:  Goal status: {GOALSTATUS:25110}  4.  *** Baseline:  Goal status: {GOALSTATUS:25110}  5.  *** Baseline:  Goal status: {GOALSTATUS:25110}  6.  *** Baseline:  Goal status: {GOALSTATUS:25110}  LONG TERM GOALS: Target date: ***  *** Baseline:  Goal status: {GOALSTATUS:25110}  2.  *** Baseline:  Goal status: {GOALSTATUS:25110}  3.  *** Baseline:  Goal status:  {GOALSTATUS:25110}  4.  *** Baseline:  Goal status: {GOALSTATUS:25110}  5.  *** Baseline:  Goal status: {GOALSTATUS:25110}  6.  *** Baseline:  Goal status: {GOALSTATUS:25110}   PLAN:  PT FREQUENCY: {rehab frequency:25116}  PT DURATION: {rehab duration:25117}  PLANNED INTERVENTIONS: {rehab planned interventions:25118::"Therapeutic exercises","Therapeutic activity","Neuromuscular re-education","Balance training","Gait training","Patient/Family education","Self Care","Joint mobilization"}  PLAN FOR NEXT SESSION: ***   Ardis Rowan, PT 05/20/2022, 2:56 PM

## 2022-05-25 ENCOUNTER — Ambulatory Visit (HOSPITAL_COMMUNITY): Payer: BC Managed Care – PPO

## 2022-06-09 ENCOUNTER — Ambulatory Visit (HOSPITAL_COMMUNITY)
Admission: RE | Admit: 2022-06-09 | Discharge: 2022-06-09 | Disposition: A | Discharge status: Home / Self Care | Payer: BC Managed Care – PPO | Source: Ambulatory Visit | Attending: Internal Medicine | Admitting: Internal Medicine

## 2022-06-09 DIAGNOSIS — Z1231 Encounter for screening mammogram for malignant neoplasm of breast: Secondary | ICD-10-CM | POA: Diagnosis not present

## 2022-06-22 ENCOUNTER — Ambulatory Visit (HOSPITAL_COMMUNITY): Payer: BC Managed Care – PPO | Attending: Orthopedic Surgery | Admitting: Physical Therapy

## 2022-06-22 DIAGNOSIS — R2689 Other abnormalities of gait and mobility: Secondary | ICD-10-CM | POA: Insufficient documentation

## 2022-06-22 DIAGNOSIS — M25562 Pain in left knee: Secondary | ICD-10-CM | POA: Diagnosis not present

## 2022-06-22 DIAGNOSIS — G8929 Other chronic pain: Secondary | ICD-10-CM | POA: Diagnosis not present

## 2022-06-22 NOTE — Therapy (Signed)
OUTPATIENT PHYSICAL THERAPY LOWER EXTREMITY EVALUATION   Patient Name: Lori Sims MRN: [id number] DOB:November 25, 1960, 62 y.o., female Today's Date: 06/22/2022  END OF SESSION:  PT End of Session - 06/22/22 0934     Visit Number 1    Number of Visits 8    Date for PT Re-Evaluation 08/03/22    Authorization Type BCBS    PT Start Time 0900    PT Stop Time 0938    PT Time Calculation (min) 38 min    Activity Tolerance Patient tolerated treatment well    Behavior During Therapy WFL for tasks assessed/performed             Past Medical History:  Diagnosis Date   GERD (gastroesophageal reflux disease)    Hypercholesterolemia    Hypertension    Past Surgical History:  Procedure Laterality Date   CESAREAN SECTION     COLONOSCOPY  2010   Dr. Laural Golden: normal except external hemorrhoids   COLONOSCOPY N/A 09/12/2014   Procedure: COLONOSCOPY;  Surgeon: Danie Binder, MD; 2 tubular adenomas and 1 hyperplastic polyp, redundant left colon, external hemorrhoids.  Repeat in 2021 with overtube.   COLONOSCOPY WITH PROPOFOL N/A 11/14/2019   Procedure: COLONOSCOPY WITH PROPOFOL;  Surgeon: Daneil Dolin, MD;  Location: [facility];  Service: Endoscopy;  Laterality: N/A;  8:30am   ESOPHAGEAL DILATION N/A 09/12/2014   Procedure: ESOPHAGEAL DILATION;  Surgeon: Danie Binder, MD;  Location: [facility];  Service: Endoscopy;  Laterality: N/A;   ESOPHAGOGASTRODUODENOSCOPY N/A 09/12/2014   Procedure: ESOPHAGOGASTRODUODENOSCOPY (EGD);  Surgeon: Danie Binder, MD; normal-appearing esophagus s/p empiric dilation, nonerosive gastritis s/p multiple biopsies, few small polyps in the gastric body s/p polypectomy, normal-appearing duodenum.  Pathology benign.   fibroid tumor right breast     fibroid tumors left breast     OVARIAN CYST DRAINAGE     OVARIAN CYST REMOVAL     Patient Active Problem List   Diagnosis Date Noted   Hematuria 02/02/2020   Abnormal Pap smear of cervix 01/30/2020   History of  adenomatous polyp of colon 08/19/2019   Tubular adenoma of colon 07/15/2019   Obstructive sleep apnea 11/05/2015   Alopecia 06/10/2015   Insomnia 06/10/2015   Dysphagia, pharyngoesophageal phase 08/27/2014   Anemia 08/27/2014   Hot flashes, menopausal 07/17/2014   Overweight (BMI 25.0-29.9) 31/49/7026   Metabolic syndrome X 37/85/8850   Vitamin D deficiency 12/02/2010   Hyperlipidemia 02/05/2010   KNEE PAIN, RIGHT 01/27/2010   B12 DEFICIENCY 07/10/2008   Essential hypertension 07/06/2008   GOITER, UNSPECIFIED 07/02/2008   ENDOMETRIOSIS 07/02/2008   IRON DEFIC ANEMIA SEC DIET IRON INTAKE 04/28/2008   Allergic rhinitis 04/28/2008    PCP: Celene Squibb MD  REFERRING PROVIDER: Mordecai Rasmussen, MD  REFERRING DIAG: [phone] (ICD-10-CM) - Chronic pain of left knee  THERAPY DIAG:  Left knee pain, unspecified chronicity - Plan: PT plan of care cert/re-cert  Other abnormalities of gait and mobility - Plan: PT plan of care cert/re-cert  Rationale for Evaluation and Treatment: Rehabilitation  ONSET DATE: 1 year ago   SUBJECTIVE:   SUBJECTIVE STATEMENT: Patient presents to therapy with complaint of LT knee pain starting about a year ago. She was doing some exercises like leg pressing and her knee began to bother her. She has had a recent injection which helped for about a week. She is taking Diclofenac which helps a little. She had an x ray which showed mild degeneration.   PERTINENT HISTORY: NA  PAIN:  Are you having pain? Yes: NPRS scale: 4/10 at worst; 3/10 Pain location: Lt knee  Pain description: aching, sore  Aggravating factors: Standing, walking, WB  Relieving factors: rest, meds, sitting too long   PRECAUTIONS: None  WEIGHT BEARING RESTRICTIONS: No  FALLS:  Has patient fallen in last 6 months? No  LIVING ENVIRONMENT: Lives with: lives alone Lives in: House/apartment Stairs: Yes: External: 2 steps; can reach both Has following equipment at home: Single  point cane and Walker - 2 wheeled  OCCUPATION: Accounts payable specialist   PLOF: Independent  PATIENT GOALS: Relieve the pain   NEXT MD VISIT:   OBJECTIVE:   DIAGNOSTIC FINDINGS: Impression: Left knee x-ray with mild degenerative changes overall  PATIENT SURVEYS:  FOTO Not entered at Eval   COGNITION: Overall cognitive status: Within functional limits for tasks assessed     SENSATION: WFL  PALPATION: No notable TTP about LT knee joint   FLEXIBILITY: Mod restriction in RT quad flexibility  LOWER EXTREMITY ROM:  Active ROM Right eval Left eval  Hip flexion    Hip extension    Hip abduction    Hip adduction    Hip internal rotation    Hip external rotation    Knee flexion 135 122  Knee extension 0 0  Ankle dorsiflexion    Ankle plantarflexion    Ankle inversion    Ankle eversion     (Blank rows = not tested)  LOWER EXTREMITY MMT:  MMT Right eval Left eval  Hip flexion 5 4  Hip extension 4 4  Hip abduction 4+ 4+  Hip adduction    Hip internal rotation    Hip external rotation    Knee flexion    Knee extension 5 4  Ankle dorsiflexion 5 5  Ankle plantarflexion    Ankle inversion    Ankle eversion     (Blank rows = not tested)  FUNCTIONAL TESTS:  2 minute walk test: 395 feet with no AD   GAIT: Slight out toeing bilateral, decreased LT knee flexion, decreased stride, no AD  TODAY'S TREATMENT:                                                                                                                              DATE:   06/22/22  Eval Quad set SLR Sidelying hip abduction   PATIENT EDUCATION:  Education details: on Eval findings, POC and HEP  Person educated: Patient Education method: Explanation Education comprehension: verbalized understanding  HOME EXERCISE PROGRAM: Access Code: [email or web address] URL: [email or web address] Date: 06/22/2022 Prepared by: Josue Hector  Exercises - Supine Quad Set  - 2 x daily - 7 x  weekly - 2 sets - 10 reps - 5 second hold - Active Straight Leg Raise with Quad Set  - 2 x daily - 7 x weekly - 2 sets - 10 reps - Sidelying Hip Abduction  - 2 x daily - 7 x weekly - 2 sets -  10 reps  ASSESSMENT:  CLINICAL IMPRESSION: Patient is a 62 y.o. female who presents to physical therapy with complaint of LT knee pain. Patient demonstrates muscle weakness, reduced ROM, and flexibility restrictions which are likely contributing to symptoms of pain and are negatively impacting patient ability to perform ADLs and functional mobility tasks. Patient will benefit from skilled physical therapy services to address these deficits to reduce pain and improve level of function with ADLs and functional mobility tasks.   OBJECTIVE IMPAIRMENTS: Abnormal gait, decreased activity tolerance, decreased mobility, difficulty walking, decreased ROM, decreased strength, impaired flexibility, improper body mechanics, and pain.   ACTIVITY LIMITATIONS: carrying, lifting, bending, sitting, standing, squatting, sleeping, stairs, transfers, and locomotion level  PARTICIPATION LIMITATIONS: meal prep, cleaning, laundry, driving, shopping, community activity, occupation, and yard work  PERSONAL FACTORS: Time since onset of injury/illness/exacerbation are also affecting patient's functional outcome.   REHAB POTENTIAL: Good  CLINICAL DECISION MAKING: Stable/uncomplicated  EVALUATION COMPLEXITY: Low   GOALS: SHORT TERM GOALS: Target date: 07/13/2022  Patient will be independent with initial HEP and self-management strategies to improve functional outcomes Baseline:  Goal status: INITIAL   LONG TERM GOALS: Target date: 08/03/2022  Patient will be independent with advanced HEP and self-management strategies to improve functional outcomes Baseline:  Goal status: INITIAL  2.  Patient will improve FOTO score to predicted value to indicate improvement in functional outcomes Baseline: Complete next session  Goal  status: INITIAL  3.  Patient will have LT knee AROM 0-130 degrees to improve functional mobility and facilitate squatting to pick up items from floor. Baseline: 0-122 deg Goal status: INITIAL  4. Patient will have equal to or > 4+/5 MMT throughout BLE to improve ability to perform functional mobility, stair ambulation and ADLs.  Baseline: See MMT Goal status: INITIAL   PLAN:  PT FREQUENCY: 1-2x/week  PT DURATION: 6 weeks  PLANNED INTERVENTIONS: Therapeutic exercises, Therapeutic activity, Neuromuscular re-education, Balance training, Gait training, Patient/Family education, Joint manipulation, Joint mobilization, Stair training, Aquatic Therapy, Dry Needling, Electrical stimulation, Spinal manipulation, Spinal mobilization, Cryotherapy, Moist heat, scar mobilization, Taping, Traction, Ultrasound, Biofeedback, Ionotophoresis '4mg'$ /ml Dexamethasone, and Manual therapy.   PLAN FOR NEXT SESSION: Complete FOTO. Progress LE strength with focus on glute and quads for improved knee stability. Progress to balance and proprioception when ready.   9:36 AM, 06/22/22 Josue Hector PT DPT  Physical Therapist with [facility]  [phone]

## 2022-06-24 ENCOUNTER — Encounter (HOSPITAL_COMMUNITY): Payer: Self-pay

## 2022-06-24 ENCOUNTER — Ambulatory Visit (HOSPITAL_COMMUNITY): Payer: BC Managed Care – PPO

## 2022-06-24 DIAGNOSIS — R2689 Other abnormalities of gait and mobility: Secondary | ICD-10-CM | POA: Diagnosis not present

## 2022-06-24 DIAGNOSIS — M25562 Pain in left knee: Secondary | ICD-10-CM | POA: Diagnosis not present

## 2022-06-24 DIAGNOSIS — G8929 Other chronic pain: Secondary | ICD-10-CM | POA: Diagnosis not present

## 2022-06-24 NOTE — Therapy (Signed)
OUTPATIENT PHYSICAL THERAPY LOWER EXTREMITY TREATMENT   Patient Name: Lori Sims MRN: [id number] DOB:05/17/1961, 62 y.o., female Today's Date: 06/24/2022  END OF SESSION:  PT End of Session - 06/24/22 0832     Visit Number 2    Number of Visits 8    Date for PT Re-Evaluation 08/03/22    Authorization Type [employer]BS    PT Start Time 0811    PT Stop Time 0855    PT Time Calculation (min) 44 min    Activity Tolerance Patient tolerated treatment well    Behavior During Therapy [facility] for tasks assessed/performed              Past Medical History:  Diagnosis Date   GERD (gastroesophageal reflux disease)    Hypercholesterolemia    Hypertension    Past Surgical History:  Procedure Laterality Date   CESAREAN SECTION     COLONOSCOPY  2010   Dr. Laural Golden: normal except external hemorrhoids   COLONOSCOPY N/A 09/12/2014   Procedure: COLONOSCOPY;  Surgeon: Danie Binder, MD; 2 tubular adenomas and 1 hyperplastic polyp, redundant left colon, external hemorrhoids.  Repeat in 2021 with overtube.   COLONOSCOPY WITH PROPOFOL N/A 11/14/2019   Procedure: COLONOSCOPY WITH PROPOFOL;  Surgeon: Daneil Dolin, MD;  [facility]: [facility];  Service: Endoscopy;  Laterality: N/A;  8:30am   ESOPHAGEAL DILATION N/A 09/12/2014   Procedure: ESOPHAGEAL DILATION;  Surgeon: Danie Binder, MD;  [facility]: [facility];  Service: Endoscopy;  Laterality: N/A;   ESOPHAGOGASTRODUODENOSCOPY N/A 09/12/2014   Procedure: ESOPHAGOGASTRODUODENOSCOPY (EGD);  Surgeon: Danie Binder, MD; normal-appearing esophagus s/p empiric dilation, nonerosive gastritis s/p multiple biopsies, few small polyps in the gastric body s/p polypectomy, normal-appearing duodenum.  Pathology benign.   fibroid tumor right breast     fibroid tumors left breast     OVARIAN CYST DRAINAGE     OVARIAN CYST REMOVAL     Patient Active Problem List   Diagnosis Date Noted   Hematuria 02/02/2020   Abnormal Pap smear of cervix 01/30/2020   History of  adenomatous polyp of colon 08/19/2019   Tubular adenoma of colon 07/15/2019   Obstructive sleep apnea 11/05/2015   Alopecia 06/10/2015   Insomnia 06/10/2015   Dysphagia, pharyngoesophageal phase 08/27/2014   Anemia 08/27/2014   Hot flashes, menopausal 07/17/2014   Overweight (BMI 25.0-29.9) 54/62/7035   Metabolic syndrome X 00/93/8182   Vitamin D deficiency 12/02/2010   Hyperlipidemia 02/05/2010   KNEE PAIN, RIGHT 01/27/2010   B12 DEFICIENCY 07/10/2008   Essential hypertension 07/06/2008   GOITER, UNSPECIFIED 07/02/2008   ENDOMETRIOSIS 07/02/2008   IRON DEFIC ANEMIA SEC DIET IRON INTAKE 04/28/2008   Allergic rhinitis 04/28/2008    PCP: Celene Squibb MD  REFERRING PROVIDER: Mordecai Rasmussen, MD  REFERRING DIAG: [phone] (ICD-10-CM) - Chronic pain of left knee  THERAPY DIAG:  Left knee pain, unspecified chronicity  Other abnormalities of gait and mobility  Rationale for Evaluation and Treatment: Rehabilitation  ONSET DATE: 1 year ago   SUBJECTIVE:   SUBJECTIVE STATEMENT:  Pt reports soreness with movements.  Has began the HEP without questions.  Pain scale 3/10.   Eval subjective:Patient presents to therapy with complaint of LT knee pain starting about a year ago. She was doing some exercises like leg pressing and her knee began to bother her. She has had a recent injection which helped for about a week. She is taking Diclofenac which helps a little. She had an x ray which showed mild  degeneration.   PERTINENT HISTORY: NA  PAIN:  Are you having pain? Yes: NPRS scale: 4/10 at worst; 3/10 Pain location: Lt knee  Pain description: aching, sore  Aggravating factors: Standing, walking, WB  Relieving factors: rest, meds, sitting too long   PRECAUTIONS: None  WEIGHT BEARING RESTRICTIONS: No  FALLS:  Has patient fallen in last 6 months? No  LIVING ENVIRONMENT: Lives with: lives alone Lives in: House/apartment Stairs: Yes: External: 2 steps; can reach both Has  following equipment at home: Single point cane and Walker - 2 wheeled  OCCUPATION: Accounts payable specialist   PLOF: Independent  PATIENT GOALS: Relieve the pain   NEXT MD VISIT:   OBJECTIVE:   DIAGNOSTIC FINDINGS: Impression: Left knee x-ray with mild degenerative changes overall  PATIENT SURVEYS:  FOTO 06/24/22 56% functional  COGNITION: Overall cognitive status: Within functional limits for tasks assessed     SENSATION: WFL  PALPATION: No notable TTP about LT knee joint   FLEXIBILITY: Mod restriction in RT quad flexibility  LOWER EXTREMITY ROM:  Active ROM Right eval Left eval  Hip flexion    Hip extension    Hip abduction    Hip adduction    Hip internal rotation    Hip external rotation    Knee flexion 135 122  Knee extension 0 0  Ankle dorsiflexion    Ankle plantarflexion    Ankle inversion    Ankle eversion     (Blank rows = not tested)  LOWER EXTREMITY MMT:  MMT Right eval Left eval  Hip flexion 5 4  Hip extension 4 4  Hip abduction 4+ 4+  Hip adduction    Hip internal rotation    Hip external rotation    Knee flexion    Knee extension 5 4  Ankle dorsiflexion 5 5  Ankle plantarflexion    Ankle inversion    Ankle eversion     (Blank rows = not tested)  FUNCTIONAL TESTS:  2 minute walk test: 395 feet with no AD   GAIT: Slight out toeing bilateral, decreased LT knee flexion, decreased stride, no AD  TODAY'S TREATMENT:                                                                                                                              DATE:  06/24/22: Reviewed goals FOTO complete- 56% functional  Reviewed HEP compliance, pt able to recall with good form and mechanics Supine: Quad sets  SLR Bridge 10x 5" seconds  Sidelying: Abd 10x5"  Prone:  Hip extension BLE 10x Lt knee flexion 10x  Standing: Rockerboard 1 min lateral and DF/PF Heel and toe raises 20x   Bike:  5'   06/22/22  Eval Quad set SLR Sidelying  hip abduction   PATIENT EDUCATION:  Education details: on Eval findings, POC and HEP  Person educated: Patient Education method: Explanation Education comprehension: verbalized understanding  HOME EXERCISE PROGRAM: Access Code: [email or web address] URL: [email or web address] Date: 06/22/2022  Prepared by: Josue Hector  Exercises - Supine Quad Set  - 2 x daily - 7 x weekly - 2 sets - 10 reps - 5 second hold - Active Straight Leg Raise with Quad Set  - 2 x daily - 7 x weekly - 2 sets - 10 reps - Sidelying Hip Abduction  - 2 x daily - 7 x weekly - 2 sets - 10 reps  ASSESSMENT:  CLINICAL IMPRESSION: Patient is a 62 y.o. female who presents to physical therapy with complaint of LT knee pain. Patient demonstrates muscle weakness, reduced ROM, and flexibility restrictions which are likely contributing to symptoms of pain and are negatively impacting patient ability to perform ADLs and functional mobility tasks. Patient will benefit from skilled physical therapy services to address these deficits to reduce pain and improve level of function with ADLs and functional mobility tasks.   Reviewed goals and educated importance of HEP Compliance for maximal benefits.  Pt able to recall and demonstrate appropriate mechanics with current exercise program.  FOTO  complete with score of 56% self perceived functional abilities.  Session focus with gluteal and quad strengthening in pain free range.  Pt able to complete all exercises with good form and mechanics with min cueing initially.  OBJECTIVE IMPAIRMENTS: Abnormal gait, decreased activity tolerance, decreased mobility, difficulty walking, decreased ROM, decreased strength, impaired flexibility, improper body mechanics, and pain.   ACTIVITY LIMITATIONS: carrying, lifting, bending, sitting, standing, squatting, sleeping, stairs, transfers, and locomotion level  PARTICIPATION LIMITATIONS: meal prep, cleaning, laundry, driving, shopping, community  activity, occupation, and yard work  PERSONAL FACTORS: Time since onset of injury/illness/exacerbation are also affecting patient's functional outcome.   REHAB POTENTIAL: Good  CLINICAL DECISION MAKING: Stable/uncomplicated  EVALUATION COMPLEXITY: Low   GOALS: SHORT TERM GOALS: Target date: 07/13/2022  Patient will be independent with initial HEP and self-management strategies to improve functional outcomes Baseline:  Goal status: IN PROGRESS   LONG TERM GOALS: Target date: 08/03/2022  Patient will be independent with advanced HEP and self-management strategies to improve functional outcomes Baseline:  Goal status: IN PROGRESS  2.  Patient will improve FOTO score to predicted value to indicate improvement in functional outcomes Baseline: Complete next session  Goal status: IN PROGRESS  3.  Patient will have LT knee AROM 0-130 degrees to improve functional mobility and facilitate squatting to pick up items from floor. Baseline: 0-122 deg Goal status: IN PROGRESS  4. Patient will have equal to or > 4+/5 MMT throughout BLE to improve ability to perform functional mobility, stair ambulation and ADLs.  Baseline: See MMT Goal status: IN PROGRESS   PLAN:  PT FREQUENCY: 1-2x/week  PT DURATION: 6 weeks  PLANNED INTERVENTIONS: Therapeutic exercises, Therapeutic activity, Neuromuscular re-education, Balance training, Gait training, Patient/Family education, Joint manipulation, Joint mobilization, Stair training, Aquatic Therapy, Dry Needling, Electrical stimulation, Spinal manipulation, Spinal mobilization, Cryotherapy, Moist heat, scar mobilization, Taping, Traction, Ultrasound, Biofeedback, Ionotophoresis '4mg'$ /ml Dexamethasone, and Manual therapy.   PLAN FOR NEXT SESSION: Progress LE strength with focus on glute and quads for improved knee stability. Progress to balance and proprioception when ready.  Next session add squats, lateral and forward step up.  Ihor Austin,  LPTA/CLT; CBIS [phone]  8:59 AM, 06/24/22

## 2022-06-29 ENCOUNTER — Ambulatory Visit (HOSPITAL_COMMUNITY): Payer: BC Managed Care – PPO | Admitting: Physical Therapy

## 2022-06-29 DIAGNOSIS — M25562 Pain in left knee: Secondary | ICD-10-CM | POA: Diagnosis not present

## 2022-06-29 DIAGNOSIS — R2689 Other abnormalities of gait and mobility: Secondary | ICD-10-CM

## 2022-06-29 DIAGNOSIS — G8929 Other chronic pain: Secondary | ICD-10-CM | POA: Diagnosis not present

## 2022-06-29 NOTE — Therapy (Signed)
OUTPATIENT PHYSICAL THERAPY LOWER EXTREMITY TREATMENT   Patient Name: Lori Sims MRN: [id number] DOB:[birth year], 62 y.o., female Today's Date: 06/29/2022  END OF SESSION:  PT End of Session - 06/29/22 1031     Visit Number 3    Number of Visits 8    Date for PT Re-Evaluation 08/03/22    Authorization Type BCBS    PT Start Time 1032    PT Stop Time 1112    PT Time Calculation (min) 40 min    Activity Tolerance Patient tolerated treatment well    Behavior During Therapy WFL for tasks assessed/performed              Past Medical History:  Diagnosis Date   GERD (gastroesophageal reflux disease)    Hypercholesterolemia    Hypertension    Past Surgical History:  Procedure Laterality Date   CESAREAN SECTION     COLONOSCOPY  2010   Dr. Laural Golden: normal except external hemorrhoids   COLONOSCOPY N/A 09/12/2014   Procedure: COLONOSCOPY;  Surgeon: Danie Binder, MD; 2 tubular adenomas and 1 hyperplastic polyp, redundant left colon, external hemorrhoids.  Repeat in 2021 with overtube.   COLONOSCOPY WITH PROPOFOL N/A 11/14/2019   Procedure: COLONOSCOPY WITH PROPOFOL;  Surgeon: Daneil Dolin, MD;  Location: [facility];  Service: Endoscopy;  Laterality: N/A;  8:30am   ESOPHAGEAL DILATION N/A 09/12/2014   Procedure: ESOPHAGEAL DILATION;  Surgeon: Danie Binder, MD;  Location: [facility];  Service: Endoscopy;  Laterality: N/A;   ESOPHAGOGASTRODUODENOSCOPY N/A 09/12/2014   Procedure: ESOPHAGOGASTRODUODENOSCOPY (EGD);  Surgeon: Danie Binder, MD; normal-appearing esophagus s/p empiric dilation, nonerosive gastritis s/p multiple biopsies, few small polyps in the gastric body s/p polypectomy, normal-appearing duodenum.  Pathology benign.   fibroid tumor right breast     fibroid tumors left breast     OVARIAN CYST DRAINAGE     OVARIAN CYST REMOVAL     Patient Active Problem List   Diagnosis Date Noted   Hematuria 02/02/2020   Abnormal Pap smear of cervix 01/30/2020   History of  adenomatous polyp of colon 08/19/2019   Tubular adenoma of colon 07/15/2019   Obstructive sleep apnea 11/05/2015   Alopecia 06/10/2015   Insomnia 06/10/2015   Dysphagia, pharyngoesophageal phase 08/27/2014   Anemia 08/27/2014   Hot flashes, menopausal 07/17/2014   Overweight (BMI 25.0-29.9) 78/29/5621   Metabolic syndrome X 30/86/5784   Vitamin D deficiency 12/02/2010   Hyperlipidemia 02/05/2010   KNEE PAIN, RIGHT 01/27/2010   B12 DEFICIENCY 07/10/2008   Essential hypertension 07/06/2008   GOITER, UNSPECIFIED 07/02/2008   ENDOMETRIOSIS 07/02/2008   IRON DEFIC ANEMIA SEC DIET IRON INTAKE 04/28/2008   Allergic rhinitis 04/28/2008    PCP: Celene Squibb MD  REFERRING PROVIDER: Mordecai Rasmussen, MD  REFERRING DIAG: [phone] (ICD-10-CM) - Chronic pain of left knee  THERAPY DIAG:  Left knee pain, unspecified chronicity  Other abnormalities of gait and mobility  Rationale for Evaluation and Treatment: Rehabilitation  ONSET DATE: 1 year ago   SUBJECTIVE:   SUBJECTIVE STATEMENT:  Patient states she is doing ok. A little stiff and a little sore. No issues with new HEP.    Eval subjective:Patient presents to therapy with complaint of LT knee pain starting about a year ago. She was doing some exercises like leg pressing and her knee began to bother her. She has had a recent injection which helped for about a week. She is taking Diclofenac which helps a little. She had an x ray  which showed mild degeneration.   PERTINENT HISTORY: NA  PAIN:  Are you having pain? Yes: NPRS scale: 1/10 Pain location: Lt knee  Pain description: aching, sore  Aggravating factors: Standing, walking, WB  Relieving factors: rest, meds, sitting too long   PRECAUTIONS: None  WEIGHT BEARING RESTRICTIONS: No  FALLS:  Has patient fallen in last 6 months? No  LIVING ENVIRONMENT: Lives with: lives alone Lives in: House/apartment Stairs: Yes: External: 2 steps; can reach both Has following  equipment at home: Single point cane and Walker - 2 wheeled  OCCUPATION: Accounts payable specialist   PLOF: Independent  PATIENT GOALS: Relieve the pain   NEXT MD VISIT:   OBJECTIVE:   DIAGNOSTIC FINDINGS: Impression: Left knee x-ray with mild degenerative changes overall  PATIENT SURVEYS:  FOTO 06/24/22 56% functional  COGNITION: Overall cognitive status: Within functional limits for tasks assessed     SENSATION: WFL  PALPATION: No notable TTP about LT knee joint   FLEXIBILITY: Mod restriction in RT quad flexibility  LOWER EXTREMITY ROM:  Active ROM Right eval Left eval  Hip flexion    Hip extension    Hip abduction    Hip adduction    Hip internal rotation    Hip external rotation    Knee flexion 135 122  Knee extension 0 0  Ankle dorsiflexion    Ankle plantarflexion    Ankle inversion    Ankle eversion     (Blank rows = not tested)  LOWER EXTREMITY MMT:  MMT Right eval Left eval  Hip flexion 5 4  Hip extension 4 4  Hip abduction 4+ 4+  Hip adduction    Hip internal rotation    Hip external rotation    Knee flexion    Knee extension 5 4  Ankle dorsiflexion 5 5  Ankle plantarflexion    Ankle inversion    Ankle eversion     (Blank rows = not tested)  FUNCTIONAL TESTS:  2 minute walk test: 395 feet with no AD   GAIT: Slight out toeing bilateral, decreased LT knee flexion, decreased stride, no AD  TODAY'S TREATMENT:                                                                                                                              DATE:  06/29/22 Quad sets 10 x 5" SLR 2 x 10 Bridge 2 x 10 with 5" holds  Sidelying hip abduction 2 x 10  Prone:  Hip extension 2 x 10 Lt knee flexion 2lb 2 x 10  Standing: Rockerboard 1 min lateral and DF/PF Heel and toe raises 20x  TKE 3/4 plates x10 each  4 then 6 inch step up x 10 each  4 then 6 inch lateral step up x10 each  Bike:  4' lv 3  06/24/22: Reviewed goals FOTO complete- 56%  functional  Reviewed HEP compliance, pt able to recall with good form and mechanics Supine: Quad sets  SLR Bridge 10x 5" seconds  Sidelying: Abd 10x5"  Prone:  Hip extension BLE 10x Lt knee flexion 10x  Standing: Rockerboard 1 min lateral and DF/PF Heel and toe raises 20x   Bike:  5'  06/22/22  Eval Quad set SLR Sidelying hip abduction   PATIENT EDUCATION:  Education details: on Eval findings, POC and HEP  Person educated: Patient Education method: Explanation Education comprehension: verbalized understanding  HOME EXERCISE PROGRAM: Access Code: [email or web address] URL: [email or web address]  06/29/22 Access Code: [email or web address] URL: [email or web address] Date: 06/29/2022 Prepared by: Josue Hector  Exercises - Supine Quad Set  - 2 x daily - 7 x weekly - 2 sets - 10 reps - 5 second hold - Active Straight Leg Raise with Quad Set  - 2 x daily - 7 x weekly - 2 sets - 10 reps - Sidelying Hip Abduction  - 2 x daily - 7 x weekly - 2 sets - 10 reps - Supine Bridge  - 2 x daily - 7 x weekly - 2 sets - 10 reps - 5" hold - Sit to Stand Without Arm Support  - 2 x daily - 7 x weekly - 2 sets - 10 reps - Prone Hip Extension  - 2 x daily - 7 x weekly - 2 sets - 10 reps - Heel Raises with Counter Support  - 2 x daily - 7 x weekly - 2 sets - 10 reps - Toe Raises with Counter Support  - 2 x daily - 7 x weekly - 2 sets - 10 reps  Date: 06/22/2022 Prepared by: Josue Hector  Exercises - Supine Quad Set  - 2 x daily - 7 x weekly - 2 sets - 10 reps - 5 second hold - Active Straight Leg Raise with Quad Set  - 2 x daily - 7 x weekly - 2 sets - 10 reps - Sidelying Hip Abduction  - 2 x daily - 7 x weekly - 2 sets - 10 reps  ASSESSMENT:  CLINICAL IMPRESSION: Patient tolerated session well. Progressed LE hip and quad strengthening. Added resistance to supine knee flexion and added resisted TKE. Patient did well with step up/ step down for functional strength activity.  No reports of increased pain during session. Patient educated on purpose and function of all added exercises. Patient will continue to benefit from skilled therapy services to reduce remaining deficits and improve functional ability.    OBJECTIVE IMPAIRMENTS: Abnormal gait, decreased activity tolerance, decreased mobility, difficulty walking, decreased ROM, decreased strength, impaired flexibility, improper body mechanics, and pain.   ACTIVITY LIMITATIONS: carrying, lifting, bending, sitting, standing, squatting, sleeping, stairs, transfers, and locomotion level  PARTICIPATION LIMITATIONS: meal prep, cleaning, laundry, driving, shopping, community activity, occupation, and yard work  PERSONAL FACTORS: Time since onset of injury/illness/exacerbation are also affecting patient's functional outcome.   REHAB POTENTIAL: Good  CLINICAL DECISION MAKING: Stable/uncomplicated  EVALUATION COMPLEXITY: Low   GOALS: SHORT TERM GOALS: Target date: 07/13/2022  Patient will be independent with initial HEP and self-management strategies to improve functional outcomes Baseline:  Goal status: IN PROGRESS   LONG TERM GOALS: Target date: 08/03/2022  Patient will be independent with advanced HEP and self-management strategies to improve functional outcomes Baseline:  Goal status: IN PROGRESS  2.  Patient will improve FOTO score to predicted value to indicate improvement in functional outcomes Baseline: Complete next session  Goal status: IN PROGRESS  3.  Patient will have LT knee AROM 0-130 degrees to improve functional mobility and facilitate squatting to pick  up items from floor. Baseline: 0-122 deg Goal status: IN PROGRESS  4. Patient will have equal to or > 4+/5 MMT throughout BLE to improve ability to perform functional mobility, stair ambulation and ADLs.  Baseline: See MMT Goal status: IN PROGRESS   PLAN:  PT FREQUENCY: 1-2x/week  PT DURATION: 6 weeks  PLANNED INTERVENTIONS:  Therapeutic exercises, Therapeutic activity, Neuromuscular re-education, Balance training, Gait training, Patient/Family education, Joint manipulation, Joint mobilization, Stair training, Aquatic Therapy, Dry Needling, Electrical stimulation, Spinal manipulation, Spinal mobilization, Cryotherapy, Moist heat, scar mobilization, Taping, Traction, Ultrasound, Biofeedback, Ionotophoresis '4mg'$ /ml Dexamethasone, and Manual therapy.   PLAN FOR NEXT SESSION: Progress LE strength with focus on glute and quads for improved knee stability. Progress to balance and proprioception when ready.  Next session add squats  10:32 AM, 06/29/22 Josue Hector PT DPT  Physical Therapist with [facility]  [phone]

## 2022-06-30 ENCOUNTER — Ambulatory Visit (HOSPITAL_COMMUNITY): Payer: BC Managed Care – PPO | Admitting: Physical Therapy

## 2022-06-30 DIAGNOSIS — R2689 Other abnormalities of gait and mobility: Secondary | ICD-10-CM | POA: Diagnosis not present

## 2022-06-30 DIAGNOSIS — M25562 Pain in left knee: Secondary | ICD-10-CM

## 2022-06-30 DIAGNOSIS — G8929 Other chronic pain: Secondary | ICD-10-CM | POA: Diagnosis not present

## 2022-06-30 NOTE — Therapy (Signed)
OUTPATIENT PHYSICAL THERAPY LOWER EXTREMITY TREATMENT   Patient Name: Lori Sims MRN: [id number] DOB:10/17/1960, 62 y.o., female Today's Date: 06/30/2022  END OF SESSION:  PT End of Session - 06/30/22 1029     Visit Number 4    Number of Visits 8    Date for PT Re-Evaluation 08/03/22    Authorization Type BCBS    PT Start Time 1033    PT Stop Time 1112    PT Time Calculation (min) 39 min    Activity Tolerance Patient tolerated treatment well    Behavior During Therapy WFL for tasks assessed/performed              Past Medical History:  Diagnosis Date   GERD (gastroesophageal reflux disease)    Hypercholesterolemia    Hypertension    Past Surgical History:  Procedure Laterality Date   CESAREAN SECTION     COLONOSCOPY  2010   Dr. Laural Golden: normal except external hemorrhoids   COLONOSCOPY N/A 09/12/2014   Procedure: COLONOSCOPY;  Surgeon: Danie Binder, MD; 2 tubular adenomas and 1 hyperplastic polyp, redundant left colon, external hemorrhoids.  Repeat in 2021 with overtube.   COLONOSCOPY WITH PROPOFOL N/A 11/14/2019   Procedure: COLONOSCOPY WITH PROPOFOL;  Surgeon: Daneil Dolin, MD;  [facility]: [facility];  Service: Endoscopy;  Laterality: N/A;  8:30am   ESOPHAGEAL DILATION N/A 09/12/2014   Procedure: ESOPHAGEAL DILATION;  Surgeon: Danie Binder, MD;  [facility]: [facility];  Service: Endoscopy;  Laterality: N/A;   ESOPHAGOGASTRODUODENOSCOPY N/A 09/12/2014   Procedure: ESOPHAGOGASTRODUODENOSCOPY (EGD);  Surgeon: Danie Binder, MD; normal-appearing esophagus s/p empiric dilation, nonerosive gastritis s/p multiple biopsies, few small polyps in the gastric body s/p polypectomy, normal-appearing duodenum.  Pathology benign.   fibroid tumor right breast     fibroid tumors left breast     OVARIAN CYST DRAINAGE     OVARIAN CYST REMOVAL     Patient Active Problem List   Diagnosis Date Noted   Hematuria 02/02/2020   Abnormal Pap smear of cervix 01/30/2020   History of  adenomatous polyp of colon 08/19/2019   Tubular adenoma of colon 07/15/2019   Obstructive sleep apnea 11/05/2015   Alopecia 06/10/2015   Insomnia 06/10/2015   Dysphagia, pharyngoesophageal phase 08/27/2014   Anemia 08/27/2014   Hot flashes, menopausal 07/17/2014   Overweight (BMI 25.0-29.9) 78/24/2353   Metabolic syndrome X 61/44/3154   Vitamin D deficiency 12/02/2010   Hyperlipidemia 02/05/2010   KNEE PAIN, RIGHT 01/27/2010   B12 DEFICIENCY 07/10/2008   Essential hypertension 07/06/2008   GOITER, UNSPECIFIED 07/02/2008   ENDOMETRIOSIS 07/02/2008   IRON DEFIC ANEMIA SEC DIET IRON INTAKE 04/28/2008   Allergic rhinitis 04/28/2008    PCP: Celene Squibb MD  REFERRING PROVIDER: Mordecai Rasmussen, MD  REFERRING DIAG: [phone] (ICD-10-CM) - Chronic pain of left knee  THERAPY DIAG:  Other abnormalities of gait and mobility  Left knee pain, unspecified chronicity  Rationale for Evaluation and Treatment: Rehabilitation  ONSET DATE: 1 year ago   SUBJECTIVE:   SUBJECTIVE STATEMENT:  Doing ok today. No pain currently. Just a little sore after yesterdays session.    Eval subjective:Patient presents to therapy with complaint of LT knee pain starting about a year ago. She was doing some exercises like leg pressing and her knee began to bother her. She has had a recent injection which helped for about a week. She is taking Diclofenac which helps a little. She had an x ray which showed mild degeneration.  PERTINENT HISTORY: NA  PAIN:  Are you having pain? Yes: NPRS scale: 0/10 Pain location: Lt knee  Pain description: aching, sore  Aggravating factors: Standing, walking, WB  Relieving factors: rest, meds, sitting too long   PRECAUTIONS: None  WEIGHT BEARING RESTRICTIONS: No  FALLS:  Has patient fallen in last 6 months? No  LIVING ENVIRONMENT: Lives with: lives alone Lives in: House/apartment Stairs: Yes: External: 2 steps; can reach both Has following equipment at  home: Single point cane and Walker - 2 wheeled  OCCUPATION: Accounts payable specialist   PLOF: Independent  PATIENT GOALS: Relieve the pain   NEXT MD VISIT:   OBJECTIVE:   DIAGNOSTIC FINDINGS: Impression: Left knee x-ray with mild degenerative changes overall  PATIENT SURVEYS:  FOTO 06/24/22 56% functional  COGNITION: Overall cognitive status: Within functional limits for tasks assessed     SENSATION: WFL  PALPATION: No notable TTP about LT knee joint   FLEXIBILITY: Mod restriction in RT quad flexibility  LOWER EXTREMITY ROM:  Active ROM Right eval Left eval  Hip flexion    Hip extension    Hip abduction    Hip adduction    Hip internal rotation    Hip external rotation    Knee flexion 135 122  Knee extension 0 0  Ankle dorsiflexion    Ankle plantarflexion    Ankle inversion    Ankle eversion     (Blank rows = not tested)  LOWER EXTREMITY MMT:  MMT Right eval Left eval  Hip flexion 5 4  Hip extension 4 4  Hip abduction 4+ 4+  Hip adduction    Hip internal rotation    Hip external rotation    Knee flexion    Knee extension 5 4  Ankle dorsiflexion 5 5  Ankle plantarflexion    Ankle inversion    Ankle eversion     (Blank rows = not tested)  FUNCTIONAL TESTS:  2 minute walk test: 395 feet with no AD   GAIT: Slight out toeing bilateral, decreased LT knee flexion, decreased stride, no AD  TODAY'S TREATMENT:                                                                                                                              DATE:  06/30/22 Bike:  5' lv 3  Rockerboard 1 min lateral and DF/PF Heel and toe raises 20x  TKE 4 plates x10 each  6 inch step up x 15 each  6 inch lateral step up x15 each 6 inch step down x 15  Tandem stance 2 x 30" on floor 2 x 30" on foam  SLS 3  x15" on foam   Hip abduction RTB 3 x 10 Hip extension RTB 3 x 10 TKE 4 plates x30  Retro walk 3 plates x 10  Hamstring curl machine 5 plates 3 x 10     1/61/09 Quad sets 10 x 5" SLR 2 x 10 Bridge  2 x 10 with 5" holds  Sidelying hip abduction 2 x 10  Prone:  Hip extension 2 x 10 Lt knee flexion 2lb 2 x 10  Standing: Rockerboard 1 min lateral and DF/PF Heel and toe raises 20x  TKE 3/4 plates x10 each  4 then 6 inch step up x 10 each  4 then 6 inch lateral step up x10 each  Bike:  5' lv 3   PATIENT EDUCATION:  Education details: on Eval findings, POC and HEP  Person educated: Patient Education method: Explanation Education comprehension: verbalized understanding  HOME EXERCISE PROGRAM: Access Code: [email or web address] URL: [email or web address]  Access Code: [email or web address] URL: [email or web address] 06/30/22 - Hip Abduction with Resistance Loop  - 1-2 x daily - 7 x weekly - 2 sets - 10 reps - Hip Extension with Resistance Loop  - 1-2 x daily - 7 x weekly - 2 sets - 10 reps - Single Leg Stance  - 1-2 x daily - 7 x weekly - 1 sets - 3-4 reps - 20 second hold  Date: 06/29/2022 Prepared by: Josue Hector  Exercises - Supine Quad Set  - 2 x daily - 7 x weekly - 2 sets - 10 reps - 5 second hold - Active Straight Leg Raise with Quad Set  - 2 x daily - 7 x weekly - 2 sets - 10 reps - Sidelying Hip Abduction  - 2 x daily - 7 x weekly - 2 sets - 10 reps - Supine Bridge  - 2 x daily - 7 x weekly - 2 sets - 10 reps - 5" hold - Sit to Stand Without Arm Support  - 2 x daily - 7 x weekly - 2 sets - 10 reps - Prone Hip Extension  - 2 x daily - 7 x weekly - 2 sets - 10 reps - Heel Raises with Counter Support  - 2 x daily - 7 x weekly - 2 sets - 10 reps - Toe Raises with Counter Support  - 2 x daily - 7 x weekly - 2 sets - 10 reps  Date: 06/22/2022 Prepared by: Josue Hector  Exercises - Supine Quad Set  - 2 x daily - 7 x weekly - 2 sets - 10 reps - 5 second hold - Active Straight Leg Raise with Quad Set  - 2 x daily - 7 x weekly - 2 sets - 10 reps - Sidelying Hip Abduction  - 2 x daily - 7 x weekly - 2 sets -  10 reps  ASSESSMENT:  CLINICAL IMPRESSION: Patient tolerated session well. Continued with there ex progressions for LE strengthening. No increased complaints of pain during session. Patient noting increased muscle fatigue with added hamstring curls. Introduced tandem stance and single leg stance for improved balance and knee stabilization. Updated HEP and issued handout. Patient will continue to benefit from skilled therapy services to reduce remaining deficits and improve functional ability.   OBJECTIVE IMPAIRMENTS: Abnormal gait, decreased activity tolerance, decreased mobility, difficulty walking, decreased ROM, decreased strength, impaired flexibility, improper body mechanics, and pain.   ACTIVITY LIMITATIONS: carrying, lifting, bending, sitting, standing, squatting, sleeping, stairs, transfers, and locomotion level  PARTICIPATION LIMITATIONS: meal prep, cleaning, laundry, driving, shopping, community activity, occupation, and yard work  PERSONAL FACTORS: Time since onset of injury/illness/exacerbation are also affecting patient's functional outcome.   REHAB POTENTIAL: Good  CLINICAL DECISION MAKING: Stable/uncomplicated  EVALUATION COMPLEXITY: Low   GOALS: SHORT TERM GOALS: Target date: 07/13/2022  Patient will be independent with initial HEP and  self-management strategies to improve functional outcomes Baseline:  Goal status: IN PROGRESS   LONG TERM GOALS: Target date: 08/03/2022  Patient will be independent with advanced HEP and self-management strategies to improve functional outcomes Baseline:  Goal status: IN PROGRESS  2.  Patient will improve FOTO score to predicted value to indicate improvement in functional outcomes Baseline: Complete next session  Goal status: IN PROGRESS  3.  Patient will have LT knee AROM 0-130 degrees to improve functional mobility and facilitate squatting to pick up items from floor. Baseline: 0-122 deg Goal status: IN PROGRESS  4. Patient  will have equal to or > 4+/5 MMT throughout BLE to improve ability to perform functional mobility, stair ambulation and ADLs.  Baseline: See MMT Goal status: IN PROGRESS   PLAN:  PT FREQUENCY: 1-2x/week  PT DURATION: 6 weeks  PLANNED INTERVENTIONS: Therapeutic exercises, Therapeutic activity, Neuromuscular re-education, Balance training, Gait training, Patient/Family education, Joint manipulation, Joint mobilization, Stair training, Aquatic Therapy, Dry Needling, Electrical stimulation, Spinal manipulation, Spinal mobilization, Cryotherapy, Moist heat, scar mobilization, Taping, Traction, Ultrasound, Biofeedback, Ionotophoresis '4mg'$ /ml Dexamethasone, and Manual therapy.   PLAN FOR NEXT SESSION: Progress LE strength with focus on glute and quads for improved knee stability.   11:04 AM, 06/30/22 Josue Hector PT DPT  Physical Therapist with [facility]  [phone]

## 2022-07-05 ENCOUNTER — Ambulatory Visit (HOSPITAL_COMMUNITY): Payer: BC Managed Care – PPO | Admitting: Physical Therapy

## 2022-07-05 ENCOUNTER — Encounter (HOSPITAL_COMMUNITY): Payer: Self-pay | Admitting: Physical Therapy

## 2022-07-05 DIAGNOSIS — R2689 Other abnormalities of gait and mobility: Secondary | ICD-10-CM | POA: Diagnosis not present

## 2022-07-05 DIAGNOSIS — M25562 Pain in left knee: Secondary | ICD-10-CM

## 2022-07-05 DIAGNOSIS — G8929 Other chronic pain: Secondary | ICD-10-CM | POA: Diagnosis not present

## 2022-07-05 NOTE — Therapy (Signed)
OUTPATIENT PHYSICAL THERAPY LOWER EXTREMITY TREATMENT   Patient Name: Lori Sims MRN: [id number] DOB:11/01/1960, 62 y.o., female Today's Date: 07/05/2022  END OF SESSION:  PT End of Session - 07/05/22 0818     Visit Number 5    Number of Visits 8    Date for PT Re-Evaluation 08/03/22    Authorization Type BCBS    PT Start Time 0817    PT Stop Time 0855    PT Time Calculation (min) 38 min    Activity Tolerance Patient tolerated treatment well    Behavior During Therapy WFL for tasks assessed/performed              Past Medical History:  Diagnosis Date   GERD (gastroesophageal reflux disease)    Hypercholesterolemia    Hypertension    Past Surgical History:  Procedure Laterality Date   CESAREAN SECTION     COLONOSCOPY  2010   Dr. Laural Golden: normal except external hemorrhoids   COLONOSCOPY N/A 09/12/2014   Procedure: COLONOSCOPY;  Surgeon: Danie Binder, MD; 2 tubular adenomas and 1 hyperplastic polyp, redundant left colon, external hemorrhoids.  Repeat in 2021 with overtube.   COLONOSCOPY WITH PROPOFOL N/A 11/14/2019   Procedure: COLONOSCOPY WITH PROPOFOL;  Surgeon: Daneil Dolin, MD;  [facility]: [facility];  Service: Endoscopy;  Laterality: N/A;  8:30am   ESOPHAGEAL DILATION N/A 09/12/2014   Procedure: ESOPHAGEAL DILATION;  Surgeon: Danie Binder, MD;  Location: [facility];  Service: Endoscopy;  Laterality: N/A;   ESOPHAGOGASTRODUODENOSCOPY N/A 09/12/2014   Procedure: ESOPHAGOGASTRODUODENOSCOPY (EGD);  Surgeon: Danie Binder, MD; normal-appearing esophagus s/p empiric dilation, nonerosive gastritis s/p multiple biopsies, few small polyps in the gastric body s/p polypectomy, normal-appearing duodenum.  Pathology benign.   fibroid tumor right breast     fibroid tumors left breast     OVARIAN CYST DRAINAGE     OVARIAN CYST REMOVAL     Patient Active Problem List   Diagnosis Date Noted   Hematuria 02/02/2020   Abnormal Pap smear of cervix 01/30/2020   History of  adenomatous polyp of colon 08/19/2019   Tubular adenoma of colon 07/15/2019   Obstructive sleep apnea 11/05/2015   Alopecia 06/10/2015   Insomnia 06/10/2015   Dysphagia, pharyngoesophageal phase 08/27/2014   Anemia 08/27/2014   Hot flashes, menopausal 07/17/2014   Overweight (BMI 25.0-29.9) 85/63/1497   Metabolic syndrome X 02/63/7858   Vitamin D deficiency 12/02/2010   Hyperlipidemia 02/05/2010   KNEE PAIN, RIGHT 01/27/2010   B12 DEFICIENCY 07/10/2008   Essential hypertension 07/06/2008   GOITER, UNSPECIFIED 07/02/2008   ENDOMETRIOSIS 07/02/2008   IRON DEFIC ANEMIA SEC DIET IRON INTAKE 04/28/2008   Allergic rhinitis 04/28/2008    PCP: Celene Squibb MD  REFERRING PROVIDER: Mordecai Rasmussen, MD  REFERRING DIAG: [phone] (ICD-10-CM) - Chronic pain of left knee  THERAPY DIAG:  Other abnormalities of gait and mobility  Left knee pain, unspecified chronicity  Rationale for Evaluation and Treatment: Rehabilitation  ONSET DATE: 1 year ago   SUBJECTIVE:   SUBJECTIVE STATEMENT:  Knee is sore today. Not sure why.    Eval subjective: Patient presents to therapy with complaint of LT knee pain starting about a year ago. She was doing some exercises like leg pressing and her knee began to bother her. She has had a recent injection which helped for about a week. She is taking Diclofenac which helps a little. She had an x ray which showed mild degeneration.   PERTINENT HISTORY: NA  PAIN:  Are you having pain? Yes: NPRS scale: 2/10 Pain location: Lt knee  Pain description: aching, sore  Aggravating factors: Standing, walking, WB  Relieving factors: rest, meds, sitting too long   PRECAUTIONS: None  WEIGHT BEARING RESTRICTIONS: No  FALLS:  Has patient fallen in last 6 months? No  LIVING ENVIRONMENT: Lives with: lives alone Lives in: House/apartment Stairs: Yes: External: 2 steps; can reach both Has following equipment at home: Single point cane and Walker - 2  wheeled  OCCUPATION: Accounts payable specialist   PLOF: Independent  PATIENT GOALS: Relieve the pain   NEXT MD VISIT:   OBJECTIVE:   DIAGNOSTIC FINDINGS: Impression: Left knee x-ray with mild degenerative changes overall  PATIENT SURVEYS:  FOTO 06/24/22 56% functional  COGNITION: Overall cognitive status: Within functional limits for tasks assessed     SENSATION: WFL  PALPATION: No notable TTP about LT knee joint   FLEXIBILITY: Mod restriction in RT quad flexibility  LOWER EXTREMITY ROM:  Active ROM Right eval Left eval  Hip flexion    Hip extension    Hip abduction    Hip adduction    Hip internal rotation    Hip external rotation    Knee flexion 135 122  Knee extension 0 0  Ankle dorsiflexion    Ankle plantarflexion    Ankle inversion    Ankle eversion     (Blank rows = not tested)  LOWER EXTREMITY MMT:  MMT Right eval Left eval  Hip flexion 5 4  Hip extension 4 4  Hip abduction 4+ 4+  Hip adduction    Hip internal rotation    Hip external rotation    Knee flexion    Knee extension 5 4  Ankle dorsiflexion 5 5  Ankle plantarflexion    Ankle inversion    Ankle eversion     (Blank rows = not tested)  FUNCTIONAL TESTS:  2 minute walk test: 395 feet with no AD   GAIT: Slight out toeing bilateral, decreased LT knee flexion, decreased stride, no AD  TODAY'S TREATMENT:                                                                                                                              DATE:  07/05/22 Bike:  5' lv 3 (seat 12)  Rockerboard INV/ EV; PF/ DF 10" holds 2 min each  Heel and toe raises 20x  Mini squat x 10 TKE 4 plates x30 LLE 6 inch step up x 15 each  2 inch lateral tap down 2 x 10  6 inch step down x 15  Tandem stance 2 x 30" on foam  SLS 3  x15" on foam   Leg press 3 plates 2 x 10 Retro walk 4 plates x 10  Hamstring curl machine 5 plates 3 x 10    9/47/09 Bike:  5' lv 3  Rockerboard 1 min lateral and  DF/PF Heel and toe raises 20x  TKE 4 plates x10 each  6 inch step up x 15 each  6 inch lateral step up x15 each 6 inch step down x 15  Tandem stance 2 x 30" on floor 2 x 30" on foam  SLS 3  x15" on foam   Hip abduction RTB 3 x 10 Hip extension RTB 3 x 10 TKE 4 plates x30  Retro walk 3 plates x 10  Hamstring curl machine 5 plates 3 x 10      PATIENT EDUCATION:  Education details: on Eval findings, POC and HEP  Person educated: Patient Education method: Explanation Education comprehension: verbalized understanding  HOME EXERCISE PROGRAM: Access Code: [email or web address] URL: [email or web address]  Access Code: [email or web address] URL: [email or web address] 06/30/22 - Hip Abduction with Resistance Loop  - 1-2 x daily - 7 x weekly - 2 sets - 10 reps - Hip Extension with Resistance Loop  - 1-2 x daily - 7 x weekly - 2 sets - 10 reps - Single Leg Stance  - 1-2 x daily - 7 x weekly - 1 sets - 3-4 reps - 20 second hold  Date: 06/29/2022 Prepared by: Josue Hector  Exercises - Supine Quad Set  - 2 x daily - 7 x weekly - 2 sets - 10 reps - 5 second hold - Active Straight Leg Raise with Quad Set  - 2 x daily - 7 x weekly - 2 sets - 10 reps - Sidelying Hip Abduction  - 2 x daily - 7 x weekly - 2 sets - 10 reps - Supine Bridge  - 2 x daily - 7 x weekly - 2 sets - 10 reps - 5" hold - Sit to Stand Without Arm Support  - 2 x daily - 7 x weekly - 2 sets - 10 reps - Prone Hip Extension  - 2 x daily - 7 x weekly - 2 sets - 10 reps - Heel Raises with Counter Support  - 2 x daily - 7 x weekly - 2 sets - 10 reps - Toe Raises with Counter Support  - 2 x daily - 7 x weekly - 2 sets - 10 reps  Date: 06/22/2022 Prepared by: Josue Hector  Exercises - Supine Quad Set  - 2 x daily - 7 x weekly - 2 sets - 10 reps - 5 second hold - Active Straight Leg Raise with Quad Set  - 2 x daily - 7 x weekly - 2 sets - 10 reps - Sidelying Hip Abduction  - 2 x daily - 7 x weekly - 2 sets - 10  reps  ASSESSMENT:  CLINICAL IMPRESSION: Patient tolerated session well overall. Progressed resistance for LE strengthening and added leg pressing. Patient educated on purpose and function of added activity. Patient well challenged with lateral heel taps due to decreased knee stability. Started with 4 inch box but decreased to 2 inch box for improved form and control. Patient will continue to benefit from skilled therapy services to reduce remaining deficits and improve functional ability.   OBJECTIVE IMPAIRMENTS: Abnormal gait, decreased activity tolerance, decreased mobility, difficulty walking, decreased ROM, decreased strength, impaired flexibility, improper body mechanics, and pain.   ACTIVITY LIMITATIONS: carrying, lifting, bending, sitting, standing, squatting, sleeping, stairs, transfers, and locomotion level  PARTICIPATION LIMITATIONS: meal prep, cleaning, laundry, driving, shopping, community activity, occupation, and yard work  PERSONAL FACTORS: Time since onset of injury/illness/exacerbation are also affecting patient's functional outcome.   REHAB POTENTIAL: Good  CLINICAL DECISION MAKING: Stable/uncomplicated  EVALUATION COMPLEXITY: Low  GOALS: SHORT TERM GOALS: Target date: 07/13/2022  Patient will be independent with initial HEP and self-management strategies to improve functional outcomes Baseline:  Goal status: IN PROGRESS   LONG TERM GOALS: Target date: 08/03/2022  Patient will be independent with advanced HEP and self-management strategies to improve functional outcomes Baseline:  Goal status: IN PROGRESS  2.  Patient will improve FOTO score to predicted value to indicate improvement in functional outcomes Baseline: Complete next session  Goal status: IN PROGRESS  3.  Patient will have LT knee AROM 0-130 degrees to improve functional mobility and facilitate squatting to pick up items from floor. Baseline: 0-122 deg Goal status: IN PROGRESS  4. Patient will  have equal to or > 4+/5 MMT throughout BLE to improve ability to perform functional mobility, stair ambulation and ADLs.  Baseline: See MMT Goal status: IN PROGRESS   PLAN:  PT FREQUENCY: 1-2x/week  PT DURATION: 6 weeks  PLANNED INTERVENTIONS: Therapeutic exercises, Therapeutic activity, Neuromuscular re-education, Balance training, Gait training, Patient/Family education, Joint manipulation, Joint mobilization, Stair training, Aquatic Therapy, Dry Needling, Electrical stimulation, Spinal manipulation, Spinal mobilization, Cryotherapy, Moist heat, scar mobilization, Taping, Traction, Ultrasound, Biofeedback, Ionotophoresis '4mg'$ /ml Dexamethasone, and Manual therapy.   PLAN FOR NEXT SESSION: Progress LE strength with focus on glute and quads for improved knee stability.   8:57 AM, 07/05/22 Josue Hector PT DPT  Physical Therapist with [facility]  [phone]

## 2022-07-06 ENCOUNTER — Ambulatory Visit (HOSPITAL_COMMUNITY): Payer: BC Managed Care – PPO | Admitting: Physical Therapy

## 2022-07-06 DIAGNOSIS — G8929 Other chronic pain: Secondary | ICD-10-CM | POA: Diagnosis not present

## 2022-07-06 DIAGNOSIS — M25562 Pain in left knee: Secondary | ICD-10-CM | POA: Diagnosis not present

## 2022-07-06 DIAGNOSIS — R2689 Other abnormalities of gait and mobility: Secondary | ICD-10-CM | POA: Diagnosis not present

## 2022-07-06 NOTE — Therapy (Signed)
OUTPATIENT PHYSICAL THERAPY LOWER EXTREMITY TREATMENT   Patient Name: Lori Sims MRN: [id number] DOB:1960-09-18, 62 y.o., female Today's Date: 07/06/2022  END OF SESSION:  PT End of Session - 07/06/22 0942     Visit Number 6    Number of Visits 8    Date for PT Re-Evaluation 08/03/22    Authorization Type BCBS    PT Start Time [phone]    PT Stop Time 1023    PT Time Calculation (min) 39 min    Activity Tolerance Patient tolerated treatment well    Behavior During Therapy WFL for tasks assessed/performed              Past Medical History:  Diagnosis Date   GERD (gastroesophageal reflux disease)    Hypercholesterolemia    Hypertension    Past Surgical History:  Procedure Laterality Date   CESAREAN SECTION     COLONOSCOPY  2010   Dr. Laural Golden: normal except external hemorrhoids   COLONOSCOPY N/A 09/12/2014   Procedure: COLONOSCOPY;  Surgeon: Danie Binder, MD; 2 tubular adenomas and 1 hyperplastic polyp, redundant left colon, external hemorrhoids.  Repeat in 2021 with overtube.   COLONOSCOPY WITH PROPOFOL N/A 11/14/2019   Procedure: COLONOSCOPY WITH PROPOFOL;  Surgeon: Daneil Dolin, MD;  [facility]: [facility];  Service: Endoscopy;  Laterality: N/A;  8:30am   ESOPHAGEAL DILATION N/A 09/12/2014   Procedure: ESOPHAGEAL DILATION;  Surgeon: Danie Binder, MD;  [facility];  Service: Endoscopy;  Laterality: N/A;   ESOPHAGOGASTRODUODENOSCOPY N/A 09/12/2014   Procedure: ESOPHAGOGASTRODUODENOSCOPY (EGD);  Surgeon: Danie Binder, MD; normal-appearing esophagus s/p empiric dilation, nonerosive gastritis s/p multiple biopsies, few small polyps in the gastric body s/p polypectomy, normal-appearing duodenum.  Pathology benign.   fibroid tumor right breast     fibroid tumors left breast     OVARIAN CYST DRAINAGE     OVARIAN CYST REMOVAL     Patient Active Problem List   Diagnosis Date Noted   Hematuria 02/02/2020   Abnormal Pap smear of cervix 01/30/2020   History of  adenomatous polyp of colon 08/19/2019   Tubular adenoma of colon 07/15/2019   Obstructive sleep apnea 11/05/2015   Alopecia 06/10/2015   Insomnia 06/10/2015   Dysphagia, pharyngoesophageal phase 08/27/2014   Anemia 08/27/2014   Hot flashes, menopausal 07/17/2014   Overweight (BMI 25.0-29.9) 11/91/4782   Metabolic syndrome X 95/62/1308   Vitamin D deficiency 12/02/2010   Hyperlipidemia 02/05/2010   KNEE PAIN, RIGHT 01/27/2010   B12 DEFICIENCY 07/10/2008   Essential hypertension 07/06/2008   GOITER, UNSPECIFIED 07/02/2008   ENDOMETRIOSIS 07/02/2008   IRON DEFIC ANEMIA SEC DIET IRON INTAKE 04/28/2008   Allergic rhinitis 04/28/2008    PCP: Celene Squibb MD  REFERRING PROVIDER: Mordecai Rasmussen, MD  REFERRING DIAG: [phone] (ICD-10-CM) - Chronic pain of left knee  THERAPY DIAG:  Other abnormalities of gait and mobility  Left knee pain, unspecified chronicity  Rationale for Evaluation and Treatment: Rehabilitation  ONSET DATE: 1 year ago   SUBJECTIVE:   SUBJECTIVE STATEMENT:  Knee is still sore. Used heating pad last night.    Eval subjective: Patient presents to therapy with complaint of LT knee pain starting about a year ago. She was doing some exercises like leg pressing and her knee began to bother her. She has had a recent injection which helped for about a week. She is taking Diclofenac which helps a little. She had an x ray which showed mild degeneration.   PERTINENT HISTORY:  NA  PAIN:  Are you having pain? Yes: NPRS scale: 2/10 Pain location: Lt knee  Pain description: aching, sore  Aggravating factors: Standing, walking, WB  Relieving factors: rest, meds, sitting too long   PRECAUTIONS: None  WEIGHT BEARING RESTRICTIONS: No  FALLS:  Has patient fallen in last 6 months? No  LIVING ENVIRONMENT: Lives with: lives alone Lives in: House/apartment Stairs: Yes: External: 2 steps; can reach both Has following equipment at home: Single point cane and Walker  - 2 wheeled  OCCUPATION: Accounts payable specialist   PLOF: Independent  PATIENT GOALS: Relieve the pain   NEXT MD VISIT:   OBJECTIVE:   DIAGNOSTIC FINDINGS: Impression: Left knee x-ray with mild degenerative changes overall  PATIENT SURVEYS:  FOTO 06/24/22 56% functional  COGNITION: Overall cognitive status: Within functional limits for tasks assessed     SENSATION: WFL  PALPATION: No notable TTP about LT knee joint   FLEXIBILITY: Mod restriction in RT quad flexibility  LOWER EXTREMITY ROM:  Active ROM Right eval Left eval  Hip flexion    Hip extension    Hip abduction    Hip adduction    Hip internal rotation    Hip external rotation    Knee flexion 135 122  Knee extension 0 0  Ankle dorsiflexion    Ankle plantarflexion    Ankle inversion    Ankle eversion     (Blank rows = not tested)  LOWER EXTREMITY MMT:  MMT Right eval Left eval  Hip flexion 5 4  Hip extension 4 4  Hip abduction 4+ 4+  Hip adduction    Hip internal rotation    Hip external rotation    Knee flexion    Knee extension 5 4  Ankle dorsiflexion 5 5  Ankle plantarflexion    Ankle inversion    Ankle eversion     (Blank rows = not tested)  FUNCTIONAL TESTS:  2 minute walk test: 395 feet with no AD   GAIT: Slight out toeing bilateral, decreased LT knee flexion, decreased stride, no AD  TODAY'S TREATMENT:                                                                                                                              DATE:  07/06/22 Bike:  5' lv 3 (seat 12)  Heel and toe raises 20x  Mini squat x 10 TKE 4 plates x30 LLE Stairs 4 inch 4 RT reciprocal   4 inch lateral tap down 2 x 10  Standing hip abduction GTB 2  x10 Standing hip extension GTB 2 x 10  Tandem stance 2 x 30" on foam  SLS with vector 3 x 10" each   Leg press 4 plates 3 x 10 Retro walk 4 plates x 10  Hamstring curl machine 5 plates 3 x 10   8/50/27 Bike:  5' lv 3 (seat 12)  Rockerboard INV/  EV; PF/ DF 10" holds 2 min each  Heel and toe raises 20x  Mini squat x 10 TKE 4 plates x30 LLE 6 inch step up x 15 each  2 inch lateral tap down 2 x 10  6 inch step down x 15  Tandem stance 2 x 30" on foam  SLS 3  x15" on foam   Leg press 3 plates 2 x 10 Retro walk 4 plates x 10  Hamstring curl machine 5 plates 3 x 10    2/56/38 Bike:  5' lv 3  Rockerboard 1 min lateral and DF/PF Heel and toe raises 20x  TKE 4 plates x10 each  6 inch step up x 15 each  6 inch lateral step up x15 each 6 inch step down x 15  Tandem stance 2 x 30" on floor 2 x 30" on foam  SLS 3  x15" on foam   Hip abduction RTB 3 x 10 Hip extension RTB 3 x 10 TKE 4 plates x30  Retro walk 3 plates x 10  Hamstring curl machine 5 plates 3 x 10      PATIENT EDUCATION:  Education details: on Eval findings, POC and HEP  Person educated: Patient Education method: Explanation Education comprehension: verbalized understanding  HOME EXERCISE PROGRAM: Access Code: [email or web address] URL: [email or web address]  Access Code: [email or web address] URL: [email or web address] 06/30/22 - Hip Abduction with Resistance Loop  - 1-2 x daily - 7 x weekly - 2 sets - 10 reps - Hip Extension with Resistance Loop  - 1-2 x daily - 7 x weekly - 2 sets - 10 reps - Single Leg Stance  - 1-2 x daily - 7 x weekly - 1 sets - 3-4 reps - 20 second hold  Date: 06/29/2022 Prepared by: Josue Hector  Exercises - Supine Quad Set  - 2 x daily - 7 x weekly - 2 sets - 10 reps - 5 second hold - Active Straight Leg Raise with Quad Set  - 2 x daily - 7 x weekly - 2 sets - 10 reps - Sidelying Hip Abduction  - 2 x daily - 7 x weekly - 2 sets - 10 reps - Supine Bridge  - 2 x daily - 7 x weekly - 2 sets - 10 reps - 5" hold - Sit to Stand Without Arm Support  - 2 x daily - 7 x weekly - 2 sets - 10 reps - Prone Hip Extension  - 2 x daily - 7 x weekly - 2 sets - 10 reps - Heel Raises with Counter Support  - 2 x daily - 7 x weekly - 2  sets - 10 reps - Toe Raises with Counter Support  - 2 x daily - 7 x weekly - 2 sets - 10 reps  Date: 06/22/2022 Prepared by: Josue Hector  Exercises - Supine Quad Set  - 2 x daily - 7 x weekly - 2 sets - 10 reps - 5 second hold - Active Straight Leg Raise with Quad Set  - 2 x daily - 7 x weekly - 2 sets - 10 reps - Sidelying Hip Abduction  - 2 x daily - 7 x weekly - 2 sets - 10 reps  ASSESSMENT:  CLINICAL IMPRESSION: Patient tolerated session well today with minimal complaint of pain. Able to progress reps and resistance level with leg press and standing hip strengthening exercise. Patient well challenged with single leg balance vectors on compliant surface showing difficulty to achieve full 10 second holds in each position. Strength is progressing well, though pain levels  remain consistent per patient subjective report. Patient will continue to benefit from skilled therapy services to reduce remaining deficits and improve functional ability.    OBJECTIVE IMPAIRMENTS: Abnormal gait, decreased activity tolerance, decreased mobility, difficulty walking, decreased ROM, decreased strength, impaired flexibility, improper body mechanics, and pain.   ACTIVITY LIMITATIONS: carrying, lifting, bending, sitting, standing, squatting, sleeping, stairs, transfers, and locomotion level  PARTICIPATION LIMITATIONS: meal prep, cleaning, laundry, driving, shopping, community activity, occupation, and yard work  PERSONAL FACTORS: Time since onset of injury/illness/exacerbation are also affecting patient's functional outcome.   REHAB POTENTIAL: Good  CLINICAL DECISION MAKING: Stable/uncomplicated  EVALUATION COMPLEXITY: Low   GOALS: SHORT TERM GOALS: Target date: 07/13/2022  Patient will be independent with initial HEP and self-management strategies to improve functional outcomes Baseline:  Goal status: IN PROGRESS   LONG TERM GOALS: Target date: 08/03/2022  Patient will be independent with  advanced HEP and self-management strategies to improve functional outcomes Baseline:  Goal status: IN PROGRESS  2.  Patient will improve FOTO score to predicted value to indicate improvement in functional outcomes Baseline: Complete next session  Goal status: IN PROGRESS  3.  Patient will have LT knee AROM 0-130 degrees to improve functional mobility and facilitate squatting to pick up items from floor. Baseline: 0-122 deg Goal status: IN PROGRESS  4. Patient will have equal to or > 4+/5 MMT throughout BLE to improve ability to perform functional mobility, stair ambulation and ADLs.  Baseline: See MMT Goal status: IN PROGRESS   PLAN:  PT FREQUENCY: 1-2x/week  PT DURATION: 6 weeks  PLANNED INTERVENTIONS: Therapeutic exercises, Therapeutic activity, Neuromuscular re-education, Balance training, Gait training, Patient/Family education, Joint manipulation, Joint mobilization, Stair training, Aquatic Therapy, Dry Needling, Electrical stimulation, Spinal manipulation, Spinal mobilization, Cryotherapy, Moist heat, scar mobilization, Taping, Traction, Ultrasound, Biofeedback, Ionotophoresis '4mg'$ /ml Dexamethasone, and Manual therapy.   PLAN FOR NEXT SESSION: Progress LE strength with focus on glute and quads for improved knee stability.   10:44 AM, 07/06/22 Josue Hector PT DPT  Physical Therapist with [facility]  [phone]

## 2022-07-07 ENCOUNTER — Other Ambulatory Visit: Payer: Self-pay | Admitting: Cardiology

## 2022-07-12 ENCOUNTER — Ambulatory Visit (HOSPITAL_COMMUNITY): Payer: BC Managed Care – PPO | Admitting: Physical Therapy

## 2022-07-12 DIAGNOSIS — R2689 Other abnormalities of gait and mobility: Secondary | ICD-10-CM

## 2022-07-12 DIAGNOSIS — M25562 Pain in left knee: Secondary | ICD-10-CM | POA: Diagnosis not present

## 2022-07-12 DIAGNOSIS — G8929 Other chronic pain: Secondary | ICD-10-CM | POA: Diagnosis not present

## 2022-07-12 NOTE — Therapy (Signed)
OUTPATIENT PHYSICAL THERAPY LOWER EXTREMITY TREATMENT   Patient Name: Lori Sims MRN: [id number] DOB:12/20/1960, 62 y.o., female Today's Date: 07/12/2022  PHYSICAL THERAPY DISCHARGE SUMMARY  Visits from Start of Care: 7  Current functional level related to goals / functional outcomes: See below    Remaining deficits: See below    Education / Equipment: See assessment   Patient agrees to discharge. Patient goals were met. Patient is being discharged due to meeting the stated rehab goals.  END OF SESSION:  PT End of Session - 07/12/22 0949     Visit Number 7    Number of Visits 8    Date for PT Re-Evaluation 08/03/22    Authorization Type BCBS    PT Start Time [phone]    PT Stop Time 1018    PT Time Calculation (min) 30 min    Activity Tolerance Patient tolerated treatment well    Behavior During Therapy WFL for tasks assessed/performed              Past Medical History:  Diagnosis Date   GERD (gastroesophageal reflux disease)    Hypercholesterolemia    Hypertension    Past Surgical History:  Procedure Laterality Date   CESAREAN SECTION     COLONOSCOPY  2010   Dr. Laural Golden: normal except external hemorrhoids   COLONOSCOPY N/A 09/12/2014   Procedure: COLONOSCOPY;  Surgeon: Danie Binder, MD; 2 tubular adenomas and 1 hyperplastic polyp, redundant left colon, external hemorrhoids.  Repeat in 2021 with overtube.   COLONOSCOPY WITH PROPOFOL N/A 11/14/2019   Procedure: COLONOSCOPY WITH PROPOFOL;  Surgeon: Daneil Dolin, MD;  [facility];  Service: Endoscopy;  Laterality: N/A;  8:30am   ESOPHAGEAL DILATION N/A 09/12/2014   Procedure: ESOPHAGEAL DILATION;  Surgeon: Danie Binder, MD;  [facility];  Service: Endoscopy;  Laterality: N/A;   ESOPHAGOGASTRODUODENOSCOPY N/A 09/12/2014   Procedure: ESOPHAGOGASTRODUODENOSCOPY (EGD);  Surgeon: Danie Binder, MD; normal-appearing esophagus s/p empiric dilation, nonerosive gastritis s/p multiple biopsies,  few small polyps in the gastric body s/p polypectomy, normal-appearing duodenum.  Pathology benign.   fibroid tumor right breast     fibroid tumors left breast     OVARIAN CYST DRAINAGE     OVARIAN CYST REMOVAL     Patient Active Problem List   Diagnosis Date Noted   Hematuria 02/02/2020   Abnormal Pap smear of cervix 01/30/2020   History of adenomatous polyp of colon 08/19/2019   Tubular adenoma of colon 07/15/2019   Obstructive sleep apnea 11/05/2015   Alopecia 06/10/2015   Insomnia 06/10/2015   Dysphagia, pharyngoesophageal phase 08/27/2014   Anemia 08/27/2014   Hot flashes, menopausal 07/17/2014   Overweight (BMI 25.0-29.9) 67/20/9470   Metabolic syndrome X 96/28/3662   Vitamin D deficiency 12/02/2010   Hyperlipidemia 02/05/2010   KNEE PAIN, RIGHT 01/27/2010   B12 DEFICIENCY 07/10/2008   Essential hypertension 07/06/2008   GOITER, UNSPECIFIED 07/02/2008   ENDOMETRIOSIS 07/02/2008   IRON DEFIC ANEMIA SEC DIET IRON INTAKE 04/28/2008   Allergic rhinitis 04/28/2008    PCP: Celene Squibb MD  REFERRING PROVIDER: Mordecai Rasmussen, MD  REFERRING DIAG: [phone] (ICD-10-CM) - Chronic pain of left knee  THERAPY DIAG:  Other abnormalities of gait and mobility  Left knee pain, unspecified chronicity  Rationale for Evaluation and Treatment: Rehabilitation  ONSET DATE: 1 year ago   SUBJECTIVE:   SUBJECTIVE STATEMENT:  Doing "alright". She feels somewhat improved but knee still hurts in mornings. Feels that QOL has improved  and knee no longer hinders her functionally. Has not had need for pain medicine recently. Overall 95% better.    Eval subjective: Patient presents to therapy with complaint of LT knee pain starting about a year ago. She was doing some exercises like leg pressing and her knee began to bother her. She has had a recent injection which helped for about a week. She is taking Diclofenac which helps a little. She had an x ray which showed mild degeneration.    PERTINENT HISTORY: NA  PAIN:  Are you having pain? Yes: NPRS scale: 1/10 Pain location: Lt knee  Pain description: aching, sore  Aggravating factors: Standing, walking, WB  Relieving factors: rest, meds, sitting too long   PRECAUTIONS: None  WEIGHT BEARING RESTRICTIONS: No  FALLS:  Has patient fallen in last 6 months? No  LIVING ENVIRONMENT: Lives with: lives alone Lives in: House/apartment Stairs: Yes: External: 2 steps; can reach both Has following equipment at home: Single point cane and Walker - 2 wheeled  OCCUPATION: Accounts payable specialist   PLOF: Independent  PATIENT GOALS: Relieve the pain   NEXT MD VISIT:   OBJECTIVE:   DIAGNOSTIC FINDINGS: Impression: Left knee x-ray with mild degenerative changes overall  PATIENT SURVEYS:  FOTO 06/24/22 56% functional; 07/12/22: 99%   COGNITION: Overall cognitive status: Within functional limits for tasks assessed     SENSATION: WFL  PALPATION: No notable TTP about LT knee joint   FLEXIBILITY: Min restriction in RT quad flexibility  LOWER EXTREMITY ROM:  Active ROM Right eval Left eval Left 07/12/22  Hip flexion     Hip extension     Hip abduction     Hip adduction     Hip internal rotation     Hip external rotation     Knee flexion 135 122 130  Knee extension 0 0 0  Ankle dorsiflexion     Ankle plantarflexion     Ankle inversion     Ankle eversion      (Blank rows = not tested)  LOWER EXTREMITY MMT:  MMT Right eval Left eval Right 07/12/22 Left 07/12/22  Hip flexion '5 4 5 5  '$ Hip extension '4 4 5 5  '$ Hip abduction 4+ 4+ 5 5  Hip adduction      Hip internal rotation      Hip external rotation      Knee flexion      Knee extension '5 4 5 5  '$ Ankle dorsiflexion '5 5 5 5  '$ Ankle plantarflexion      Ankle inversion      Ankle eversion       (Blank rows = not tested)  FUNCTIONAL TESTS:  2 minute walk test: 395 feet with no AD   GAIT: Slight out toeing bilateral, decreased LT knee  flexion, decreased stride, no AD  TODAY'S TREATMENT:  DATE:  07/12/22 Reassess  FOTO MMT AROM  HEP review   07/06/22 Bike:  5' lv 3 (seat 12)  Heel and toe raises 20x  Mini squat x 10 TKE 4 plates x30 LLE Stairs 4 inch 4 RT reciprocal   4 inch lateral tap down 2 x 10  Standing hip abduction GTB 2  x10 Standing hip extension GTB 2 x 10  Tandem stance 2 x 30" on foam  SLS with vector 3 x 10" each   Leg press 4 plates 3 x 10 Retro walk 4 plates x 10  Hamstring curl machine 5 plates 3 x 10   8/65/78 Bike:  5' lv 3 (seat 12)  Rockerboard INV/ EV; PF/ DF 10" holds 2 min each  Heel and toe raises 20x  Mini squat x 10 TKE 4 plates x30 LLE 6 inch step up x 15 each  2 inch lateral tap down 2 x 10  6 inch step down x 15  Tandem stance 2 x 30" on foam  SLS 3  x15" on foam   Leg press 3 plates 2 x 10 Retro walk 4 plates x 10  Hamstring curl machine 5 plates 3 x 10    4/69/62 Bike:  5' lv 3  Rockerboard 1 min lateral and DF/PF Heel and toe raises 20x  TKE 4 plates x10 each  6 inch step up x 15 each  6 inch lateral step up x15 each 6 inch step down x 15  Tandem stance 2 x 30" on floor 2 x 30" on foam  SLS 3  x15" on foam   Hip abduction RTB 3 x 10 Hip extension RTB 3 x 10 TKE 4 plates x30  Retro walk 3 plates x 10  Hamstring curl machine 5 plates 3 x 10      PATIENT EDUCATION:  Education details: on Eval findings, POC and HEP  Person educated: Patient Education method: Explanation Education comprehension: verbalized understanding  HOME EXERCISE PROGRAM: Access Code: [email or web address] URL: [email or web address]  Access Code: [email or web address] URL: [email or web address] 06/30/22 - Hip Abduction with Resistance Loop  - 1-2 x daily - 7 x weekly - 2 sets - 10 reps - Hip Extension with Resistance Loop  - 1-2 x daily - 7 x weekly  - 2 sets - 10 reps - Single Leg Stance  - 1-2 x daily - 7 x weekly - 1 sets - 3-4 reps - 20 second hold  Date: 06/29/2022 Prepared by: Josue Hector  Exercises - Supine Quad Set  - 2 x daily - 7 x weekly - 2 sets - 10 reps - 5 second hold - Active Straight Leg Raise with Quad Set  - 2 x daily - 7 x weekly - 2 sets - 10 reps - Sidelying Hip Abduction  - 2 x daily - 7 x weekly - 2 sets - 10 reps - Supine Bridge  - 2 x daily - 7 x weekly - 2 sets - 10 reps - 5" hold - Sit to Stand Without Arm Support  - 2 x daily - 7 x weekly - 2 sets - 10 reps - Prone Hip Extension  - 2 x daily - 7 x weekly - 2 sets - 10 reps - Heel Raises with Counter Support  - 2 x daily - 7 x weekly - 2 sets - 10 reps - Toe Raises with Counter Support  - 2 x daily - 7 x weekly - 2 sets - 10 reps  Date: 06/22/2022  Prepared by: Josue Hector  Exercises - Supine Quad Set  - 2 x daily - 7 x weekly - 2 sets - 10 reps - 5 second hold - Active Straight Leg Raise with Quad Set  - 2 x daily - 7 x weekly - 2 sets - 10 reps - Sidelying Hip Abduction  - 2 x daily - 7 x weekly - 2 sets - 10 reps  ASSESSMENT:  CLINICAL IMPRESSION: Patient has made very good progress and is currently with all therapy goals MET. Reviewed progress and goals status. Reviewed comprehensive HEP and answered all patient questions. Patient being DC today with all goals MET. Encouraged patient to follow up with therapy services with any further questions or concerns.   OBJECTIVE IMPAIRMENTS: Abnormal gait, decreased activity tolerance, decreased mobility, difficulty walking, decreased ROM, decreased strength, impaired flexibility, improper body mechanics, and pain.   ACTIVITY LIMITATIONS: carrying, lifting, bending, sitting, standing, squatting, sleeping, stairs, transfers, and locomotion level  PARTICIPATION LIMITATIONS: meal prep, cleaning, laundry, driving, shopping, community activity, occupation, and yard work  PERSONAL FACTORS: Time since  onset of injury/illness/exacerbation are also affecting patient's functional outcome.   REHAB POTENTIAL: Good  CLINICAL DECISION MAKING: Stable/uncomplicated  EVALUATION COMPLEXITY: Low   GOALS: SHORT TERM GOALS: Target date: 07/13/2022  Patient will be independent with initial HEP and self-management strategies to improve functional outcomes Baseline:  Goal status: MET   LONG TERM GOALS: Target date: 08/03/2022  Patient will be independent with advanced HEP and self-management strategies to improve functional outcomes Baseline:  Goal status: MET  2.  Patient will improve FOTO score to predicted value to indicate improvement in functional outcomes Baseline: 99% function  Goal status: MET  3.  Patient will have LT knee AROM 0-130 degrees to improve functional mobility and facilitate squatting to pick up items from floor. Baseline: 0-130 deg Goal status: MET  4. Patient will have equal to or > 4+/5 MMT throughout BLE to improve ability to perform functional mobility, stair ambulation and ADLs.  Baseline: See MMT Goal status: MET   PLAN:  PT FREQUENCY: 1-2x/week  PT DURATION: 6 weeks  PLANNED INTERVENTIONS: Therapeutic exercises, Therapeutic activity, Neuromuscular re-education, Balance training, Gait training, Patient/Family education, Joint manipulation, Joint mobilization, Stair training, Aquatic Therapy, Dry Needling, Electrical stimulation, Spinal manipulation, Spinal mobilization, Cryotherapy, Moist heat, scar mobilization, Taping, Traction, Ultrasound, Biofeedback, Ionotophoresis '4mg'$ /ml Dexamethasone, and Manual therapy.   PLAN FOR NEXT SESSION: DC to HEP   10:22 AM, 07/12/22 Josue Hector PT DPT  Physical Therapist with [facility]  [phone]

## 2022-07-14 ENCOUNTER — Encounter (HOSPITAL_COMMUNITY): Payer: BC Managed Care – PPO | Admitting: Physical Therapy

## 2022-07-18 ENCOUNTER — Encounter (HOSPITAL_COMMUNITY): Payer: BC Managed Care – PPO | Admitting: Physical Therapy

## 2022-08-11 ENCOUNTER — Encounter: Payer: Self-pay | Admitting: Radiology

## 2022-08-23 DIAGNOSIS — I10 Essential (primary) hypertension: Secondary | ICD-10-CM | POA: Diagnosis not present

## 2022-09-02 DIAGNOSIS — G4700 Insomnia, unspecified: Secondary | ICD-10-CM | POA: Diagnosis not present

## 2022-09-02 DIAGNOSIS — E782 Mixed hyperlipidemia: Secondary | ICD-10-CM | POA: Diagnosis not present

## 2022-09-02 DIAGNOSIS — F330 Major depressive disorder, recurrent, mild: Secondary | ICD-10-CM | POA: Diagnosis not present

## 2022-09-02 DIAGNOSIS — I10 Essential (primary) hypertension: Secondary | ICD-10-CM | POA: Diagnosis not present

## 2022-10-20 ENCOUNTER — Encounter: Payer: Self-pay | Admitting: Cardiology

## 2022-11-30 ENCOUNTER — Other Ambulatory Visit: Payer: Self-pay | Admitting: Cardiology

## 2022-12-08 ENCOUNTER — Ambulatory Visit: Payer: BC Managed Care – PPO | Attending: Cardiology

## 2022-12-08 DIAGNOSIS — I6523 Occlusion and stenosis of bilateral carotid arteries: Secondary | ICD-10-CM

## 2022-12-12 ENCOUNTER — Telehealth: Payer: Self-pay | Admitting: *Deleted

## 2022-12-12 NOTE — Telephone Encounter (Signed)
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

## 2022-12-12 NOTE — Telephone Encounter (Signed)
Lesle Chris, LPN 06/18/1094  0:45 PM EDT Back to Top    Notified, copy to pcp.

## 2023-01-15 ENCOUNTER — Other Ambulatory Visit: Payer: Self-pay | Admitting: Cardiology

## 2023-01-23 ENCOUNTER — Telehealth: Payer: Self-pay | Admitting: Cardiology

## 2023-01-23 ENCOUNTER — Other Ambulatory Visit: Payer: Self-pay | Admitting: Cardiology

## 2023-01-23 DIAGNOSIS — I6529 Occlusion and stenosis of unspecified carotid artery: Secondary | ICD-10-CM

## 2023-01-23 MED ORDER — SPIRONOLACTONE 25 MG PO TABS: 25.0000 mg | ORAL_TABLET | Freq: Every day | ORAL | 0 refills | Status: DC

## 2023-01-23 NOTE — Telephone Encounter (Signed)
Completed.

## 2023-01-23 NOTE — Telephone Encounter (Signed)
*  STAT* If patient is at the pharmacy, call can be transferred to refill team.   1. Which medications need to be refilled? (please list name of each medication and dose if known)   spironolactone (ALDACTONE) 25 MG tablet   2. Would you like to learn more about the convenience, safety, & potential cost savings by using the [facility]?    3. Are you open to using the [facility] (Type [facility]. ).  4. Which pharmacy/location (including street and city if local pharmacy) is medication to be sent to?  [facility] - [place], [place] - [address]   5. Do they need a 30 day or 90 day supply?   90 day  Patient still has some medication left.  Patient has appointment scheduled on 10/24.

## 2023-02-14 DIAGNOSIS — I10 Essential (primary) hypertension: Secondary | ICD-10-CM | POA: Diagnosis not present

## 2023-02-17 DIAGNOSIS — G4700 Insomnia, unspecified: Secondary | ICD-10-CM | POA: Diagnosis not present

## 2023-02-17 DIAGNOSIS — I10 Essential (primary) hypertension: Secondary | ICD-10-CM | POA: Diagnosis not present

## 2023-02-17 DIAGNOSIS — I129 Hypertensive chronic kidney disease with stage 1 through stage 4 chronic kidney disease, or unspecified chronic kidney disease: Secondary | ICD-10-CM | POA: Diagnosis not present

## 2023-02-17 DIAGNOSIS — E782 Mixed hyperlipidemia: Secondary | ICD-10-CM | POA: Diagnosis not present

## 2023-02-17 DIAGNOSIS — N1831 Chronic kidney disease, stage 3a: Secondary | ICD-10-CM | POA: Diagnosis not present

## 2023-02-17 DIAGNOSIS — F330 Major depressive disorder, recurrent, mild: Secondary | ICD-10-CM | POA: Diagnosis not present

## 2023-03-02 ENCOUNTER — Other Ambulatory Visit: Payer: Self-pay | Admitting: Cardiology

## 2023-04-06 ENCOUNTER — Ambulatory Visit: Payer: BC Managed Care – PPO | Attending: Cardiology | Admitting: Cardiology

## 2023-04-06 ENCOUNTER — Encounter: Payer: Self-pay | Admitting: Cardiology

## 2023-04-06 VITALS — BP 110/70 | HR 60 | Ht 66.0 in | Wt 186.6 lb

## 2023-04-06 DIAGNOSIS — E782 Mixed hyperlipidemia: Secondary | ICD-10-CM

## 2023-04-06 DIAGNOSIS — I6529 Occlusion and stenosis of unspecified carotid artery: Secondary | ICD-10-CM | POA: Diagnosis not present

## 2023-04-06 DIAGNOSIS — I10 Essential (primary) hypertension: Secondary | ICD-10-CM

## 2023-04-06 NOTE — Patient Instructions (Signed)
Medication Instructions:  Your physician recommends that you continue on your current medications as directed. Please refer to the Current Medication list given to you today.  *If you need a refill on your cardiac medications before your next appointment, please call your pharmacy*   Lab Work: None If you have labs (blood work) drawn today and your tests are completely normal, you will receive your results only by: MyChart Message (if you have MyChart) OR A paper copy in the mail If you have any lab test that is abnormal or we need to change your treatment, we will call you to review the results.   Testing/Procedures: Your physician has requested that you have a carotid duplex. This test is an ultrasound of the carotid arteries in your neck. It looks at blood flow through these arteries that supply the brain with blood. Allow one hour for this exam. There are no restrictions or special instructions.    Follow-Up: At [facility], you and your health needs are our priority.  As part of our continuing mission to provide you with exceptional heart care, we have created designated Provider Care Teams.  These Care Teams include your primary Cardiologist (physician) and Advanced Practice Providers (APPs -  Physician Assistants and Nurse Practitioners) who all work together to provide you with the care you need, when you need it.  We recommend signing up for the patient portal called "MyChart".  Sign up information is provided on this After Visit Summary.  MyChart is used to connect with patients for Virtual Visits (Telemedicine).  Patients are able to view lab/test results, encounter notes, upcoming appointments, etc.  Non-urgent messages can be sent to your provider as well.   To learn more about what you can do with MyChart, go to [email or web address].    Your next appointment:   1 year(s)  Provider:   You may see Dina Rich, MD or one of the following Advanced Practice  Providers on your designated Care Team:   Randall An, PA-C  Jacolyn Reedy, [place]     Other Instructions

## 2023-04-06 NOTE — Progress Notes (Signed)
Clinical Summary Ms. Bearman is a 62 y.o.female seen today for follow up of the following medical problems.    1. HTN - notes indicate she has felt her bp meds have contributed to insomnia and hair loss.  - has not been able to tolerate clonidine or amlodopine in the past. Norvasc caused leg swelling, chest pain. She does not recall the side effects to clonidien.  - has had some chronic issues with hypokalemia   - she lost weight a few years ago and bp came down too low, aldactone was stopped and lisinopril lowered  - at last visit bp's trended back up, lisinopril increased to 40mg  daily.  - compliant with meds  - compliant with meds - ASA was causing stomach issues, started on plavix.      2. Carotid stenosis - 08/2020 carotid US: RICA 1-39%, LICA 40-59% - 11/2021 LICA RICA 1-39%, LICA 40-59% 11/2022 carotid US: RICA 1-39%, LICA 40-59%  - she is on daily ASA, statin.     3. Hyperlipidemia - reports pcp changed atorva from 3 times a week to daily after most recent labs.     4. 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        Past Medical History:  Diagnosis Date   GERD (gastroesophageal reflux disease)    Hypercholesterolemia    Hypertension      Allergies  Allergen Reactions   Penicillins Itching and Nausea And Vomiting   Tramadol Nausea And Vomiting   Zoster Vaccine Recombinant, Adjuvanted Other (See Comments)    Swollen red arm Swollen red arm     Current Outpatient Medications  Medication Sig Dispense Refill   atorvastatin (LIPITOR) 20 MG tablet Take 20 mg by mouth daily.     Calcium-Magnesium-Zinc (CAL-MAG-ZINC PO) Take 1 tablet by mouth daily.     cholecalciferol (VITAMIN D3) 10 MCG (400 UNIT) TABS tablet Take 400 Units by mouth daily.     clopidogrel (PLAVIX) 75 MG tablet TAKE 1 TABLET DAILY (STOP ASPIRIN) 90 tablet 3    folic acid (FOLVITE) 1 MG tablet Take 1 mg by mouth daily.     lisinopril (ZESTRIL) 40 MG tablet TAKE 1 TABLET DAILY 30 tablet 0   spironolactone (ALDACTONE) 25 MG tablet Take 1 tablet (25 mg total) by mouth daily. 90 tablet 0   No current facility-administered medications for this visit.     Past Surgical History:  Procedure Laterality Date   CESAREAN SECTION     COLONOSCOPY  2010   Dr. Karilyn Cota: normal except external hemorrhoids   COLONOSCOPY N/A 09/12/2014   Procedure: COLONOSCOPY;  Surgeon: West Bali, MD; 2 tubular adenomas and 1 hyperplastic polyp, redundant left colon, external hemorrhoids.  Repeat in 2021 with overtube.   COLONOSCOPY WITH PROPOFOL N/A 11/14/2019   Procedure: COLONOSCOPY WITH PROPOFOL;  Surgeon: Corbin Ade, MD;  [facility];  Service: Endoscopy;  Laterality: N/A;  8:30am   ESOPHAGEAL DILATION N/A 09/12/2014   Procedure: ESOPHAGEAL DILATION;  Surgeon: West Bali, MD;  [facility];  Service: Endoscopy;  Laterality: N/A;   ESOPHAGOGASTRODUODENOSCOPY N/A 09/12/2014   Procedure: ESOPHAGOGASTRODUODENOSCOPY (EGD);  Surgeon: West Bali, MD; normal-appearing esophagus s/p empiric dilation, nonerosive gastritis s/p multiple biopsies, few small polyps in the gastric body s/p polypectomy, normal-appearing duodenum.  Pathology benign.   fibroid tumor  right breast     fibroid tumors left breast     OVARIAN CYST DRAINAGE     OVARIAN CYST REMOVAL       Allergies  Allergen Reactions   Penicillins Itching and Nausea And Vomiting   Tramadol Nausea And Vomiting   Zoster Vaccine Recombinant, Adjuvanted Other (See Comments)    Swollen red arm Swollen red arm      Family History  Problem Relation Age of Onset   Diabetes Mother    Hypertension Father    Colon cancer Neg Hx      Social History Ms. Marina reports that she has never smoked. She has never used smokeless tobacco. Ms. Abascal reports current alcohol use.   Review of  Systems CONSTITUTIONAL: No weight loss, fever, chills, weakness or fatigue.  HEENT: Eyes: No visual loss, blurred vision, double vision or yellow sclerae.No hearing loss, sneezing, congestion, runny nose or sore throat.  SKIN: No rash or itching.  CARDIOVASCULAR: per hpi RESPIRATORY: No shortness of breath, cough or sputum.  GASTROINTESTINAL: No anorexia, nausea, vomiting or diarrhea. No abdominal pain or blood.  GENITOURINARY: No burning on urination, no polyuria NEUROLOGICAL: No headache, dizziness, syncope, paralysis, ataxia, numbness or tingling in the extremities. No change in bowel or bladder control.  MUSCULOSKELETAL: No muscle, back pain, joint pain or stiffness.  LYMPHATICS: No enlarged nodes. No history of splenectomy.  PSYCHIATRIC: No history of depression or anxiety.  ENDOCRINOLOGIC: No reports of sweating, cold or heat intolerance. No polyuria or polydipsia.  Marland Kitchen   Physical Examination Today's Vitals   04/06/23 0841  BP: 110/70  Pulse: 60  SpO2: 100%  Weight: 186 lb 9.6 oz (84.6 kg)  Height: 5\' 6"  (1.676 m)   Body mass index is 30.12 kg/m.  Gen: resting comfortably, no acute distress HEENT: no scleral icterus, pupils equal round and reactive, no palptable cervical adenopathy,  CV: RRR, 2/6 systolic murmur rusb, no jvd Resp: Clear to auscultation bilaterally GI: abdomen is soft, non-tender, non-distended, normal bowel sounds, no hepatosplenomegaly MSK: extremities are warm, no edema.  Skin: warm, no rash Neuro:  no focal deficits Psych: appropriate affect   Diagnostic Studies 08/2019 echo IMPRESSIONS     1. Left ventricular ejection fraction, by estimation, is 60 to 65%. The  left ventricle has normal function. The left ventricle has no regional  wall motion abnormalities. Left ventricular diastolic parameters were  normal.   2. Right ventricular systolic function is normal. The right ventricular  size is normal.   3. The mitral valve is normal in structure.  No evidence of mitral valve  regurgitation. No evidence of mitral stenosis.   4. The aortic valve is tricuspid. Aortic valve regurgitation is not  visualized. No aortic stenosis is present.   5. The inferior vena cava is normal in size with greater than 50%  respiratory variability, suggesting right atrial pressure of 3 mmHg.      09/2019 carotid US Summary:  Right Carotid: Velocities in the right ICA are consistent with a 1-39%  stenosis.   Left Carotid: Velocities in the left ICA are consistent with a 40-59%  stenosis.      09/2020 carotid US RICA 1-39%, LICA 40-59%   11/2021 carotid US Summary:  Right Carotid: Velocities in the right ICA are consistent with a 1-39%  stenosis.   Left Carotid: Velocities in the left ICA are consistent with a 40-59%  stenosis.   Vertebrals:  Bilateral vertebral arteries demonstrate antegrade flow.  Subclavians: Normal flow hemodynamics  were seen in bilateral subclavian               arteries.     Assessment and Plan   1. HTN - at goal, continue current meds   2. Carotid stenosis - mild to moderate stenosis has been stable -repeat US 11/2023 - continue statin. ASA causes stomach upset, changed to plavix.    3.Hyperlipidemia -at goal, continue current meds  EKG today shows SR, no ischemic changes  F/u 1 year     Antoine Poche, M.D.

## 2023-04-30 ENCOUNTER — Other Ambulatory Visit: Payer: Self-pay | Admitting: Cardiology

## 2023-05-01 ENCOUNTER — Ambulatory Visit: Payer: BC Managed Care – PPO | Admitting: Cardiology

## 2023-05-07 ENCOUNTER — Other Ambulatory Visit: Payer: Self-pay | Admitting: Cardiology

## 2023-05-08 MED ORDER — LISINOPRIL 40 MG PO TABS: 40.0000 mg | ORAL_TABLET | Freq: Every day | ORAL | 3 refills | Status: DC

## 2023-05-09 ENCOUNTER — Telehealth: Payer: Self-pay | Admitting: Cardiology

## 2023-05-09 MED ORDER — CLOPIDOGREL BISULFATE 75 MG PO TABS: 75.0000 mg | ORAL_TABLET | Freq: Every day | ORAL | 1 refills | Status: DC

## 2023-05-09 NOTE — Telephone Encounter (Signed)
*  STAT* If patient is at the pharmacy, call can be transferred to refill team.   1. Which medications need to be refilled? (please list name of each medication and dose if known) clopidogrel (PLAVIX) 75 MG tablet  2. Which pharmacy/location (including street and city if local pharmacy) is medication to be sent to? [facility] - [place], [place] - [address]  3. Do they need a 30 day or 90 day supply? 90 day

## 2023-05-09 NOTE — Telephone Encounter (Signed)
Refilled

## 2023-06-16 DIAGNOSIS — Z1231 Encounter for screening mammogram for malignant neoplasm of breast: Secondary | ICD-10-CM | POA: Diagnosis not present

## 2023-06-16 DIAGNOSIS — Z124 Encounter for screening for malignant neoplasm of cervix: Secondary | ICD-10-CM | POA: Diagnosis not present

## 2023-06-16 DIAGNOSIS — Z1331 Encounter for screening for depression: Secondary | ICD-10-CM | POA: Diagnosis not present

## 2023-06-16 DIAGNOSIS — Z01411 Encounter for gynecological examination (general) (routine) with abnormal findings: Secondary | ICD-10-CM | POA: Diagnosis not present

## 2023-06-16 DIAGNOSIS — Z01419 Encounter for gynecological examination (general) (routine) without abnormal findings: Secondary | ICD-10-CM | POA: Diagnosis not present

## 2023-07-26 DIAGNOSIS — J069 Acute upper respiratory infection, unspecified: Secondary | ICD-10-CM | POA: Diagnosis not present

## 2023-08-22 ENCOUNTER — Ambulatory Visit
Admission: EM | Admit: 2023-08-22 | Discharge: 2023-08-22 | Disposition: A | Discharge status: Home / Self Care | Attending: Nurse Practitioner | Admitting: Nurse Practitioner

## 2023-08-22 ENCOUNTER — Encounter: Payer: Self-pay | Admitting: Emergency Medicine

## 2023-08-22 DIAGNOSIS — Z87448 Personal history of other diseases of urinary system: Secondary | ICD-10-CM | POA: Insufficient documentation

## 2023-08-22 DIAGNOSIS — R399 Unspecified symptoms and signs involving the genitourinary system: Secondary | ICD-10-CM | POA: Insufficient documentation

## 2023-08-22 LAB — POCT URINALYSIS DIP (MANUAL ENTRY)
Bilirubin, UA: NEGATIVE
Glucose, UA: NEGATIVE mg/dL
Ketones, POC UA: NEGATIVE mg/dL
Nitrite, UA: NEGATIVE
Protein Ur, POC: NEGATIVE mg/dL
Spec Grav, UA: 1.02
Urobilinogen, UA: NEGATIVE U/dL — AB
pH, UA: 7

## 2023-08-22 MED ORDER — NITROFURANTOIN MONOHYD MACRO 100 MG PO CAPS: 100.0000 mg | ORAL_CAPSULE | Freq: Two times a day (BID) | ORAL | 0 refills | Status: DC

## 2023-08-22 NOTE — ED Triage Notes (Signed)
 Urinary urgency and frequency since yesterday.  C/o some lower ABD cramping.

## 2023-08-22 NOTE — Discharge Instructions (Signed)
 Urine culture is pending.  You will be contacted if the results of the culture are negative, or if the culture shows the medication prescribed today needs to be changed to treat your symptoms. May take over-the-counter Tylenol or ibuprofen as needed for pain, fever, or general discomfort. Increase fluids and allow for plenty of rest. Develop a toileting schedule that will let you urinate at least every 2 hours. Avoid caffeine such as tea, soda, or coffee while symptoms persist. As discussed, if your culture result is negative, and you are continuing to experience symptoms, please follow-up with your primary care physician for further evaluation. Follow-up as needed.

## 2023-08-22 NOTE — ED Provider Notes (Signed)
 [facility]    CSN: [id number] Arrival date & time: 08/22/23  1734      History   Chief Complaint No chief complaint on file.   HPI Lori Sims is a 63 y.o. female.   The history is provided by the patient.   Patient presents with a 1 day history of urinary urgency, and frequency, with lower abdominal cramping.  Denies fever, chills, dysuria, decreased urine stream, hematuria, flank pain, low back pain, or vaginal symptoms.  Patient states her last UTI was approximately 1 year ago.  Denies history of kidney stones or kidney infection.  Past Medical History:  Diagnosis Date   GERD (gastroesophageal reflux disease)    Hypercholesterolemia    Hypertension     Patient Active Problem List   Diagnosis Date Noted   Hematuria 02/02/2020   Abnormal Pap smear of cervix 01/30/2020   History of adenomatous polyp of colon 08/19/2019   Tubular adenoma of colon 07/15/2019   Obstructive sleep apnea 11/05/2015   Alopecia 06/10/2015   Insomnia 06/10/2015   Dysphagia, pharyngoesophageal phase 08/27/2014   Anemia 08/27/2014   Hot flashes, menopausal 07/17/2014   Overweight (BMI 25.0-29.9) 07/01/2013   Metabolic syndrome X 07/01/2013   Vitamin D deficiency 12/02/2010   Hyperlipidemia 02/05/2010   KNEE PAIN, RIGHT 01/27/2010   B12 DEFICIENCY 07/10/2008   Essential hypertension 07/06/2008   Goiter 07/02/2008   Endometriosis 07/02/2008   IRON DEFIC ANEMIA SEC DIET IRON INTAKE 04/28/2008   Allergic rhinitis 04/28/2008    Past Surgical History:  Procedure Laterality Date   CESAREAN SECTION     COLONOSCOPY  2010   Dr. Karilyn Cota: normal except external hemorrhoids   COLONOSCOPY N/A 09/12/2014   Procedure: COLONOSCOPY;  Surgeon: West Bali, MD; 2 tubular adenomas and 1 hyperplastic polyp, redundant left colon, external hemorrhoids.  Repeat in 2021 with overtube.   COLONOSCOPY WITH PROPOFOL N/A 11/14/2019   Procedure: COLONOSCOPY WITH PROPOFOL;  Surgeon: Corbin Ade, MD;   [facility];  Service: Endoscopy;  Laterality: N/A;  8:30am   ESOPHAGEAL DILATION N/A 09/12/2014   Procedure: ESOPHAGEAL DILATION;  Surgeon: West Bali, MD;  Location: [facility];  Service: Endoscopy;  Laterality: N/A;   ESOPHAGOGASTRODUODENOSCOPY N/A 09/12/2014   Procedure: ESOPHAGOGASTRODUODENOSCOPY (EGD);  Surgeon: West Bali, MD; normal-appearing esophagus s/p empiric dilation, nonerosive gastritis s/p multiple biopsies, few small polyps in the gastric body s/p polypectomy, normal-appearing duodenum.  Pathology benign.   fibroid tumor right breast     fibroid tumors left breast     OVARIAN CYST DRAINAGE     OVARIAN CYST REMOVAL      OB History   No obstetric history on file.      Home Medications    Prior to Admission medications   Medication Sig Start Date End Date Taking? Authorizing Provider  nitrofurantoin, macrocrystal-monohydrate, (MACROBID) 100 MG capsule Take 1 capsule (100 mg total) by mouth 2 (two) times daily. 08/22/23  Yes Leath-Warren, Sadie Haber, NP  atorvastatin (LIPITOR) 20 MG tablet Take 20 mg by mouth daily.    [provider]  Calcium-Magnesium-Zinc (CAL-MAG-ZINC PO) Take 1 tablet by mouth daily.    [provider]  clopidogrel (PLAVIX) 75 MG tablet Take 1 tablet (75 mg total) by mouth daily. 05/09/23   Antoine Poche, MD  folic acid (FOLVITE) 1 MG tablet Take 1 mg by mouth daily.    [provider]  lisinopril (ZESTRIL) 40 MG tablet Take 1 tablet (40 mg total)  by mouth daily. 05/08/23   Antoine Poche, MD  LUNESTA 2 MG TABS tablet Take 2 mg by mouth at bedtime as needed. 02/21/23   [provider]  spironolactone (ALDACTONE) 25 MG tablet TAKE 1 TABLET DAILY 05/01/23   Branch, Dorothe Pea, MD    Family History Family History  Problem Relation Age of Onset   Diabetes Mother    Hypertension Father    Colon cancer Neg Hx     Social History Social History   Tobacco Use   Smoking status: Never    Smokeless tobacco: Never  Vaping Use   Vaping status: Never Used  Substance Use Topics   Alcohol use: Not Currently    Comment: rare wine   Drug use: No     Allergies   Penicillins; Tramadol; and Zoster vaccine recombinant, adjuvanted   Review of Systems Review of Systems Per HPI  Physical Exam Triage Vital Signs ED Triage Vitals  Encounter Vitals Group     BP 08/22/23 1745 (!) 149/75     Systolic BP Percentile --      Diastolic BP Percentile --      Pulse Rate 08/22/23 1745 80     Resp 08/22/23 1745 18     Temp 08/22/23 1745 98.2 F (36.8 C)     Temp Source 08/22/23 1745 Oral     SpO2 08/22/23 1745 96 %     Weight --      Height --      Head Circumference --      Peak Flow --      Pain Score 08/22/23 1746 0     Pain Loc --      Pain Education --      Exclude from Growth Chart --    No data found.  Updated Vital Signs BP (!) 149/75 (BP Location: Right Arm)   Pulse 80   Temp 98.2 F (36.8 C) (Oral)   Resp 18   LMP 08/26/2014   SpO2 96%   Visual Acuity Right Eye Distance:   Left Eye Distance:   Bilateral Distance:    Right Eye Near:   Left Eye Near:    Bilateral Near:     Physical Exam Vitals and nursing note reviewed.  Constitutional:      General: She is not in acute distress.    Appearance: Normal appearance.  HENT:     Head: Normocephalic.     Right Ear: Tympanic membrane, ear canal and external ear normal.     Left Ear: Tympanic membrane, ear canal and external ear normal.  Eyes:     Pupils: Pupils are equal, round, and reactive to light.  Cardiovascular:     Rate and Rhythm: Normal rate and regular rhythm.     Pulses: Normal pulses.     Heart sounds: Normal heart sounds.  Pulmonary:     Effort: Pulmonary effort is normal. No respiratory distress.     Breath sounds: Normal breath sounds. No stridor. No wheezing, rhonchi or rales.  Abdominal:     General: Bowel sounds are normal.     Palpations: Abdomen is soft.     Tenderness: There  is no abdominal tenderness. There is no right CVA tenderness or left CVA tenderness.  Musculoskeletal:     Cervical back: Normal range of motion.  Lymphadenopathy:     Cervical: No cervical adenopathy.  Skin:    General: Skin is warm and dry.  Neurological:     General: No  focal deficit present.     Mental Status: She is alert and oriented to person, place, and time.  Psychiatric:        Mood and Affect: Mood normal.        Behavior: Behavior normal.      UC Treatments / Results  Labs (all labs ordered are listed, but only abnormal results are displayed) Labs Reviewed  POCT URINALYSIS DIP (MANUAL ENTRY) - Abnormal; Notable for the following components:      Result Value   Color, UA light yellow (*)    Blood, UA small (*)    Urobilinogen, UA negative (*)    Leukocytes, UA Small (1+) (*)    All other components within normal limits  URINE CULTURE    EKG   Radiology No results found.  Procedures Procedures (including critical care time)  Medications Ordered in UC Medications - No data to display  Initial Impression / Assessment and Plan / UC Course  I have reviewed the triage vital signs and the nursing notes.  Pertinent labs & imaging results that were available during my care of the patient were reviewed by me and considered in my medical decision making (see chart for details).  Urinalysis does not indicate an obvious UTI, urine culture is pending.  In the interim, will start Macrobid 100 mg twice daily for the next 5 days.  Supportive care recommendations were provided and discussed with the patient to include fluids, over-the-counter analgesics, developing a toileting schedule, and avoiding caffeine.  Patient was advised if symptoms continue to persist and culture is negative, follow-up with PCP is recommended.  Patient was in agreement with this plan of care and verbalizes understanding.  All questions were answered.  Patient stable for discharge.  Final Clinical  Impressions(s) / UC Diagnoses   Final diagnoses:  UTI symptoms  History of hematuria     Discharge Instructions      Urine culture is pending.  You will be contacted if the results of the culture are negative, or if the culture shows the medication prescribed today needs to be changed to treat your symptoms. May take over-the-counter Tylenol or ibuprofen as needed for pain, fever, or general discomfort. Increase fluids and allow for plenty of rest. Develop a toileting schedule that will let you urinate at least every 2 hours. Avoid caffeine such as tea, soda, or coffee while symptoms persist. As discussed, if your culture result is negative, and you are continuing to experience symptoms, please follow-up with your primary care physician for further evaluation. Follow-up as needed.     ED Prescriptions     Medication Sig Dispense Auth. Provider   nitrofurantoin, macrocrystal-monohydrate, (MACROBID) 100 MG capsule Take 1 capsule (100 mg total) by mouth 2 (two) times daily. 10 capsule Leath-Warren, Sadie Haber, NP      PDMP not reviewed this encounter.   Abran Cantor, NP 08/22/23 1818

## 2023-08-24 LAB — URINE CULTURE: Culture: 20000 — AB

## 2023-09-08 DIAGNOSIS — I10 Essential (primary) hypertension: Secondary | ICD-10-CM | POA: Diagnosis not present

## 2023-09-14 DIAGNOSIS — Z0000 Encounter for general adult medical examination without abnormal findings: Secondary | ICD-10-CM | POA: Diagnosis not present

## 2023-09-14 DIAGNOSIS — E782 Mixed hyperlipidemia: Secondary | ICD-10-CM | POA: Diagnosis not present

## 2023-09-14 DIAGNOSIS — G4700 Insomnia, unspecified: Secondary | ICD-10-CM | POA: Diagnosis not present

## 2023-09-14 DIAGNOSIS — N1831 Chronic kidney disease, stage 3a: Secondary | ICD-10-CM | POA: Diagnosis not present

## 2023-09-14 DIAGNOSIS — I10 Essential (primary) hypertension: Secondary | ICD-10-CM | POA: Diagnosis not present

## 2023-09-14 DIAGNOSIS — Z0001 Encounter for general adult medical examination with abnormal findings: Secondary | ICD-10-CM | POA: Diagnosis not present

## 2023-09-14 DIAGNOSIS — I129 Hypertensive chronic kidney disease with stage 1 through stage 4 chronic kidney disease, or unspecified chronic kidney disease: Secondary | ICD-10-CM | POA: Diagnosis not present

## 2023-09-14 DIAGNOSIS — Z23 Encounter for immunization: Secondary | ICD-10-CM | POA: Diagnosis not present

## 2023-10-12 DIAGNOSIS — R7309 Other abnormal glucose: Secondary | ICD-10-CM | POA: Diagnosis not present

## 2023-11-12 DIAGNOSIS — R7309 Other abnormal glucose: Secondary | ICD-10-CM | POA: Diagnosis not present

## 2023-12-06 DIAGNOSIS — G4730 Sleep apnea, unspecified: Secondary | ICD-10-CM | POA: Diagnosis not present

## 2023-12-08 ENCOUNTER — Other Ambulatory Visit: Payer: Self-pay | Admitting: Cardiology

## 2023-12-12 DIAGNOSIS — I10 Essential (primary) hypertension: Secondary | ICD-10-CM | POA: Diagnosis not present

## 2023-12-12 DIAGNOSIS — R7309 Other abnormal glucose: Secondary | ICD-10-CM | POA: Diagnosis not present

## 2023-12-20 DIAGNOSIS — N1831 Chronic kidney disease, stage 3a: Secondary | ICD-10-CM | POA: Diagnosis not present

## 2023-12-20 DIAGNOSIS — E870 Hyperosmolality and hypernatremia: Secondary | ICD-10-CM | POA: Diagnosis not present

## 2023-12-20 DIAGNOSIS — I10 Essential (primary) hypertension: Secondary | ICD-10-CM | POA: Diagnosis not present

## 2023-12-20 DIAGNOSIS — I129 Hypertensive chronic kidney disease with stage 1 through stage 4 chronic kidney disease, or unspecified chronic kidney disease: Secondary | ICD-10-CM | POA: Diagnosis not present

## 2023-12-20 DIAGNOSIS — E782 Mixed hyperlipidemia: Secondary | ICD-10-CM | POA: Diagnosis not present

## 2024-01-12 DIAGNOSIS — R7309 Other abnormal glucose: Secondary | ICD-10-CM | POA: Diagnosis not present

## 2024-01-16 DIAGNOSIS — F5101 Primary insomnia: Secondary | ICD-10-CM | POA: Diagnosis not present

## 2024-01-16 DIAGNOSIS — R0683 Snoring: Secondary | ICD-10-CM | POA: Diagnosis not present

## 2024-01-16 DIAGNOSIS — F330 Major depressive disorder, recurrent, mild: Secondary | ICD-10-CM | POA: Diagnosis not present

## 2024-01-16 DIAGNOSIS — G4733 Obstructive sleep apnea (adult) (pediatric): Secondary | ICD-10-CM | POA: Diagnosis not present

## 2024-01-16 DIAGNOSIS — F5105 Insomnia due to other mental disorder: Secondary | ICD-10-CM | POA: Diagnosis not present

## 2024-02-08 NOTE — Progress Notes (Signed)
 Cardiology Office Note    Date:  02/13/2024  ID:  Lori Sims, DOB 1960-12-02, MRN [id number] Cardiologist: Alvan Carrier, MD Cardiology APP:  Johnson Laymon HERO, PA-C { :  History of Present Illness:    Lori Sims is a 63 y.o. female with past medical history of carotid artery stenosis, HTN, HLD and cardiac murmur (benign echo in 08/2019) who presents to the office today for evaluation of variable BP.  She was last examined by Dr. Alvan in 03/2023 and denied any recent anginal symptoms at that time. She had not been on Amlodipine  as this caused lower extremity edema in the past. She had previously been on aspirin  but this caused stomach upset, therefore was switched to Plavix  in the interim. She was continued on Atorvastatin  20 mg daily, Lisinopril  40 mg daily and Spironolactone  25 mg daily.  In talking with the patient today, she reports that her blood pressure has been variable over the past few months.  She was started on Prozac  for anxiety/stress and blood pressure improved after initiation of this. Was also started on CPAP therapy in the interim as well with improvement in readings. Says that she develops a headache when BP is high or low. SBP was dropping into the 90's at times and she stopped Spironolactone  25 mg daily. She also held Lisinopril  temporarily but then would take 20 mg daily at times and 40 mg daily at times depending on her readings. 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 She denies any recent chest pain or dyspnea on exertion when doing routine activities. No specific palpitations, orthopnea or lower extremity edema.  Studies Reviewed:   EKG: EKG is not ordered today.  Echocardiogram: 08/2019 IMPRESSIONS     1. Left ventricular ejection fraction, by estimation, is 60 to 65%. The  left ventricle has normal function. The left ventricle has no regional  wall motion abnormalities. Left ventricular diastolic parameters  were  normal.   2. Right ventricular systolic function is normal. The right ventricular  size is normal.   3. The mitral valve is normal in structure. No evidence of mitral valve  regurgitation. No evidence of mitral stenosis.   4. The aortic valve is tricuspid. Aortic valve regurgitation is not  visualized. No aortic stenosis is present.   5. The inferior vena cava is normal in size with greater than 50%  respiratory variability, suggesting right atrial pressure of 3 mmHg.   Carotid Dopplers: 11/2022 Summary:  Right Carotid: Velocities in the right ICA are consistent with a 1-39%  stenosis.   Left Carotid: Velocities in the left ICA are consistent with a 40-59%  stenosis.   Physical Exam:   VS:  BP 116/68 (BP Location: Right Arm, Cuff Size: Large)   Pulse (!) 56   Ht 5' 6 (1.676 m)   Wt 189 lb (85.7 kg)   LMP 08/26/2014   SpO2 100%   BMI 30.51 kg/m    Wt Readings from Last 3 Encounters:  02/13/24 189 lb (85.7 kg)  04/06/23 186 lb 9.6 oz (84.6 kg)  01/02/22 186 lb (84.4 kg)     GEN: Well nourished, well developed female appearing in no acute distress NECK: No JVD; No carotid bruits CARDIAC: RRR, 2/6 systolic murmur along sternal border.  RESPIRATORY:  Clear to auscultation without rales, wheezing or rhonchi  ABDOMEN: Appears non-distended. No obvious abdominal masses. EXTREMITIES: No clubbing or cyanosis. No pitting edema.  Distal pedal pulses are 2+  bilaterally.   Assessment and Plan:   1. Bilateral carotid artery stenosis - Most recent dopplers in 11/2022 showed 1 to 39% RICA stenosis and 40 to 59% LICA stenosis. She has not yet had her follow-up carotid dopplers and we will have these arranged prior to her next visit. Continue Plavix  75mg  daily (previously intolerant to ASA) and statin therapy.   2. Essential hypertension - BP has been variable as outlined above but has overall significantly improved since being started on CPAP and also being started on Prozac  for  anxiety/stress.Reviewed options with the patient and will reduce Lisinopril  to 20 mg daily. Remain off Spironolactone  for now. Labs in 12/2023 showed creatinine was stable at 1.34 and K+ 4.4. She was provided with a BP log and encouraged to return this in a month for reassessment.  3. Mixed hyperlipidemia - LDL was at 99 when checked in 12/2023 by review of [facility] DXA. She is currently on Atorvastatin  20 mg daily. Would discuss further titration of this to 40 mg daily at the time of her next office visit but we will try to make medication changes gradually.  Disposition: Will arrange for carotid dopplers. Keep previously scheduled follow-up in 04/2024 with Dr. Alvan.  Signed, Laymon CHRISTELLA Qua, PA-C

## 2024-02-12 DIAGNOSIS — R7309 Other abnormal glucose: Secondary | ICD-10-CM | POA: Diagnosis not present

## 2024-02-13 ENCOUNTER — Encounter: Payer: Self-pay | Admitting: Student

## 2024-02-13 ENCOUNTER — Ambulatory Visit: Attending: Student | Admitting: Student

## 2024-02-13 VITALS — BP 116/68 | HR 56 | Ht 66.0 in | Wt 189.0 lb

## 2024-02-13 DIAGNOSIS — I6523 Occlusion and stenosis of bilateral carotid arteries: Secondary | ICD-10-CM | POA: Diagnosis not present

## 2024-02-13 DIAGNOSIS — E782 Mixed hyperlipidemia: Secondary | ICD-10-CM

## 2024-02-13 DIAGNOSIS — I10 Essential (primary) hypertension: Secondary | ICD-10-CM | POA: Diagnosis not present

## 2024-02-13 MED ORDER — LISINOPRIL 20 MG PO TABS: 20.0000 mg | ORAL_TABLET | Freq: Every day | ORAL | 3 refills | Status: AC

## 2024-02-13 NOTE — Patient Instructions (Signed)
 Medication Instructions:   Decrease Lisinopril  to 20 mg daily.   *If you need a refill on your cardiac medications before your next appointment, please call your pharmacy*  Lab Work: NONE   If you have labs (blood work) drawn today and your tests are completely normal, you will receive your results only by: MyChart Message (if you have MyChart) OR A paper copy in the mail If you have any lab test that is abnormal or we need to change your treatment, we will call you to review the results.  Testing/Procedures: Your physician has requested that you have a carotid duplex. This test is an ultrasound of the carotid arteries in your neck. It looks at blood flow through these arteries that supply the brain with blood. Allow one hour for this exam. There are no restrictions or special instructions.   Follow-Up: At [facility], you and your health needs are our priority.  As part of our continuing mission to provide you with exceptional heart care, our providers are all part of one team.  This team includes your primary Cardiologist (physician) and Advanced Practice Providers or APPs (Physician Assistants and Nurse Practitioners) who all work together to provide you with the care you need, when you need it.  Your next appointment:    In November   Provider:   Dorn Ross, MD    We recommend signing up for the patient portal called MyChart.  Sign up information is provided on this After Visit Summary.  MyChart is used to connect with patients for Virtual Visits (Telemedicine).  Patients are able to view lab/test results, encounter notes, upcoming appointments, etc.  Non-urgent messages can be sent to your provider as well.   To learn more about what you can do with MyChart, go to [email or web address].   Other Instructions Thank you for choosing [facility]!

## 2024-03-13 DIAGNOSIS — R7309 Other abnormal glucose: Secondary | ICD-10-CM | POA: Diagnosis not present

## 2024-03-25 DIAGNOSIS — I10 Essential (primary) hypertension: Secondary | ICD-10-CM | POA: Diagnosis not present

## 2024-04-01 ENCOUNTER — Ambulatory Visit (HOSPITAL_COMMUNITY)

## 2024-04-02 DIAGNOSIS — E870 Hyperosmolality and hypernatremia: Secondary | ICD-10-CM | POA: Diagnosis not present

## 2024-04-02 DIAGNOSIS — N1831 Chronic kidney disease, stage 3a: Secondary | ICD-10-CM | POA: Diagnosis not present

## 2024-04-02 DIAGNOSIS — I129 Hypertensive chronic kidney disease with stage 1 through stage 4 chronic kidney disease, or unspecified chronic kidney disease: Secondary | ICD-10-CM | POA: Diagnosis not present

## 2024-04-02 DIAGNOSIS — Z23 Encounter for immunization: Secondary | ICD-10-CM | POA: Diagnosis not present

## 2024-04-02 DIAGNOSIS — I10 Essential (primary) hypertension: Secondary | ICD-10-CM | POA: Diagnosis not present

## 2024-04-02 DIAGNOSIS — E782 Mixed hyperlipidemia: Secondary | ICD-10-CM | POA: Diagnosis not present

## 2024-04-03 ENCOUNTER — Ambulatory Visit (HOSPITAL_COMMUNITY)
Admission: RE | Admit: 2024-04-03 | Discharge: 2024-04-03 | Disposition: A | Discharge status: Home / Self Care | Source: Ambulatory Visit | Attending: Cardiology | Admitting: Cardiology

## 2024-04-03 DIAGNOSIS — I6529 Occlusion and stenosis of unspecified carotid artery: Secondary | ICD-10-CM | POA: Diagnosis not present

## 2024-04-03 DIAGNOSIS — I6523 Occlusion and stenosis of bilateral carotid arteries: Secondary | ICD-10-CM | POA: Diagnosis not present

## 2024-04-13 DIAGNOSIS — R7309 Other abnormal glucose: Secondary | ICD-10-CM | POA: Diagnosis not present

## 2024-04-19 ENCOUNTER — Ambulatory Visit: Payer: Self-pay | Admitting: Cardiology

## 2024-05-07 ENCOUNTER — Encounter: Payer: Self-pay | Admitting: Cardiology

## 2024-05-07 ENCOUNTER — Ambulatory Visit: Attending: Cardiology | Admitting: Cardiology

## 2024-05-07 VITALS — BP 138/74 | Ht 66.0 in | Wt 189.0 lb

## 2024-05-07 DIAGNOSIS — I6523 Occlusion and stenosis of bilateral carotid arteries: Secondary | ICD-10-CM | POA: Diagnosis not present

## 2024-05-07 DIAGNOSIS — E782 Mixed hyperlipidemia: Secondary | ICD-10-CM | POA: Diagnosis not present

## 2024-05-07 DIAGNOSIS — I10 Essential (primary) hypertension: Secondary | ICD-10-CM | POA: Diagnosis not present

## 2024-05-07 MED ORDER — ATORVASTATIN CALCIUM 40 MG PO TABS: 40.0000 mg | ORAL_TABLET | Freq: Every day | ORAL | 3 refills | Status: AC

## 2024-05-07 NOTE — Progress Notes (Signed)
 Clinical Summary Lori Sims is a 63 y.o.female seen today for follow up of the following medical problems.    1. HTN - notes indicate she has felt her bp meds have contributed to insomnia and hair loss.  - has not been able to tolerate clonidine or amlodopine in the past. Norvasc  caused leg swelling, chest pain. She does not recall the side effects to clonidine  - has had some chronic issues with hypokalemia   - she lost weight a few years ago and bp came down too low, aldactone  was stopped and lisinopril  lowered     - compliant with meds - ASA was causing stomach issues, started on plavix .     - variable bp's at 02/2024 visit, PA Strader lowered lisinopril  to 20mg  daily. Aldactone  was stopped  - homes 120s/70s - compliant with meds   2. Carotid stenosis - 08/2020 carotid US : RICA 1-39%, LICA 40-59% - 11/2021 LICA RICA 1-39%, LICA 40-59% 11/2022 carotid US : RICA 1-39%, LICA 40-59% - 03/2024 mild plaque bilateral    - she is on daily plavix  statin.     3. Hyperlipidemia - reports pcp changed atorva from 3 times a week to daily after most recent labs.        4. Heart murmur - history of mild heart murmur, echo 2021 was benign  5. 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    Past Medical History:  Diagnosis Date   GERD (gastroesophageal reflux disease)    Hypercholesterolemia    Hypertension      Allergies  Allergen Reactions   Penicillins Itching and Nausea And Vomiting   Tramadol  Nausea And Vomiting   Zoster Vaccine Recombinant , Adjuvanted Other (See Comments)    Swollen red arm     Current Outpatient Medications  Medication Sig Dispense Refill   atorvastatin  (LIPITOR) 20 MG tablet Take 20 mg by mouth daily.     clopidogrel  (PLAVIX ) 75 MG tablet TAKE 1 TABLET DAILY 90 tablet 1   FLUoxetine  (PROZAC ) 10 MG tablet Take 10 mg by mouth daily.     folic acid (FOLVITE) 1 MG  tablet Take 1 mg by mouth daily.     lisinopril  (ZESTRIL ) 20 MG tablet Take 1 tablet (20 mg total) by mouth daily. 90 tablet 3   No current facility-administered medications for this visit.     Past Surgical History:  Procedure Laterality Date   CESAREAN SECTION     COLONOSCOPY  2010   Dr. Golda: normal except external hemorrhoids   COLONOSCOPY N/A 09/12/2014   Procedure: COLONOSCOPY;  Surgeon: Margo LITTIE Haddock, MD; 2 tubular adenomas and 1 hyperplastic polyp, redundant left colon, external hemorrhoids.  Repeat in 2021 with overtube.   COLONOSCOPY WITH PROPOFOL  N/A 11/14/2019   Procedure: COLONOSCOPY WITH PROPOFOL ;  Surgeon: Shaaron Lamar HERO, MD;  [facility];  Service: Endoscopy;  Laterality: N/A;  8:30am   ESOPHAGEAL DILATION N/A 09/12/2014   Procedure: ESOPHAGEAL DILATION;  Surgeon: Margo LITTIE Haddock, MD;  [facility];  Service: Endoscopy;  Laterality: N/A;   ESOPHAGOGASTRODUODENOSCOPY N/A 09/12/2014   Procedure: ESOPHAGOGASTRODUODENOSCOPY (EGD);  Surgeon: Margo LITTIE Haddock, MD; normal-appearing esophagus s/p empiric dilation, nonerosive gastritis s/p multiple biopsies, few small polyps in the gastric body s/p polypectomy, normal-appearing duodenum.  Pathology benign.   fibroid tumor right breast     fibroid tumors left breast  OVARIAN CYST DRAINAGE     OVARIAN CYST REMOVAL       Allergies  Allergen Reactions   Penicillins Itching and Nausea And Vomiting   Tramadol  Nausea And Vomiting   Zoster Vaccine Recombinant , Adjuvanted Other (See Comments)    Swollen red arm      Family History  Problem Relation Age of Onset   Diabetes Mother    Hypertension Father    Colon cancer Neg Hx      Social History Lori Sims reports that she has never smoked. She has never used smokeless tobacco. Lori Sims reports that she does not currently use alcohol.     Physical Examination Today's Vitals   05/07/24 1334 05/07/24 1405  BP: (!) 144/70 138/74  Weight: 189 lb  (85.7 kg)   Height: 5' 6 (1.676 m)    Body mass index is 30.51 kg/m.  Gen: resting comfortably, no acute distress HEENT: no scleral icterus, pupils equal round and reactive, no palptable cervical adenopathy,  CV: RRR, 2/6 systolic murmur rusb, no jvd Resp: Clear to auscultation bilaterally GI: abdomen is soft, non-tender, non-distended, normal bowel sounds, no hepatosplenomegaly MSK: extremities are warm, no edema.  Skin: warm, no rash Neuro:  no focal deficits Psych: appropriate affect   Diagnostic Studies  08/2019 echo IMPRESSIONS     1. Left ventricular ejection fraction, by estimation, is 60 to 65%. The  left ventricle has normal function. The left ventricle has no regional  wall motion abnormalities. Left ventricular diastolic parameters were  normal.   2. Right ventricular systolic function is normal. The right ventricular  size is normal.   3. The mitral valve is normal in structure. No evidence of mitral valve  regurgitation. No evidence of mitral stenosis.   4. The aortic valve is tricuspid. Aortic valve regurgitation is not  visualized. No aortic stenosis is present.   5. The inferior vena cava is normal in size with greater than 50%  respiratory variability, suggesting right atrial pressure of 3 mmHg.      09/2019 carotid US  Summary:  Right Carotid: Velocities in the right ICA are consistent with a 1-39%  stenosis.   Left Carotid: Velocities in the left ICA are consistent with a 40-59%  stenosis.      09/2020 carotid US  RICA 1-39%, LICA 40-59%   11/2021 carotid US  Summary:  Right Carotid: Velocities in the right ICA are consistent with a 1-39%  stenosis.   Left Carotid: Velocities in the left ICA are consistent with a 40-59%  stenosis.   Vertebrals:  Bilateral vertebral arteries demonstrate antegrade flow.  Subclavians: Normal flow hemodynamics were seen in bilateral subclavian               arteries.    Assessment and Plan  1. HTN - bp elevated  here but home numbers at goal. Prior issues with low bp's have resolved with lowered lisinopril  dosing of 20mg  daily - continue current meds   2. Carotid stenosis - mild to moderate stenosis has been stable - continue statin. ASA causes stomach upset, changed to plavix .  - repeat US  next year   3.Hyperlipidemia Above goal, increase atorvastatin  to 40mg  daily   EKG shows mild sinus brady 56, no acute ischemic changes  F/u 1 year      Lori Sims, M.D.

## 2024-05-07 NOTE — Patient Instructions (Signed)
 Medication Instructions:   Increase Atorvastatin  to 40mg  daily  Continue all other medications.     Labwork:  none  Testing/Procedures:  none  Follow-Up:  Your physician wants you to follow up in:  1 year.  You should receive a recall letter in the mail about 2 months prior to the time you are due.  If you don't receive this, please call our office to schedule your follow up appointment.      Any Other Special Instructions Will Be Listed Below (If Applicable).   If you need a refill on your cardiac medications before your next appointment, please call your pharmacy.

## 2024-07-24 ENCOUNTER — Institutional Professional Consult (permissible substitution) (INDEPENDENT_AMBULATORY_CARE_PROVIDER_SITE_OTHER): Admitting: Otolaryngology
